# Patient Record
Sex: Female | Born: 1950 | Race: Black or African American | Hispanic: No | Smoking: Current every day smoker
Health system: Southern US, Community
[De-identification: ages and names within clinical notes are randomized; demographics above are authoritative.]

## PROBLEM LIST (undated history)

## (undated) ENCOUNTER — Ambulatory Visit: Discharge status: Absent without leave | Payer: Medicare Other

## (undated) DIAGNOSIS — M1990 Unspecified osteoarthritis, unspecified site: Secondary | ICD-10-CM

## (undated) DIAGNOSIS — Z720 Tobacco use: Secondary | ICD-10-CM

## (undated) DIAGNOSIS — K649 Unspecified hemorrhoids: Secondary | ICD-10-CM

## (undated) DIAGNOSIS — F1910 Other psychoactive substance abuse, uncomplicated: Secondary | ICD-10-CM

## (undated) DIAGNOSIS — F419 Anxiety disorder, unspecified: Secondary | ICD-10-CM

## (undated) HISTORY — PX: TUBAL LIGATION: SHX77

---

## 1997-10-20 ENCOUNTER — Emergency Department (HOSPITAL_COMMUNITY): Admission: EM | Admit: 1997-10-20 | Discharge: 1997-10-20 | Payer: Self-pay | Admitting: Emergency Medicine

## 1997-10-20 ENCOUNTER — Encounter: Payer: Self-pay | Admitting: Emergency Medicine

## 1997-12-29 ENCOUNTER — Emergency Department (HOSPITAL_COMMUNITY): Admission: EM | Admit: 1997-12-29 | Discharge: 1997-12-29 | Payer: Self-pay | Admitting: Emergency Medicine

## 1997-12-29 ENCOUNTER — Encounter: Payer: Self-pay | Admitting: Emergency Medicine

## 1998-08-13 ENCOUNTER — Emergency Department (HOSPITAL_COMMUNITY): Admission: EM | Admit: 1998-08-13 | Discharge: 1998-08-13 | Payer: Self-pay | Admitting: Emergency Medicine

## 1998-08-18 ENCOUNTER — Encounter: Payer: Self-pay | Admitting: Emergency Medicine

## 1998-08-18 ENCOUNTER — Encounter: Payer: Self-pay | Admitting: *Deleted

## 1998-08-18 ENCOUNTER — Emergency Department (HOSPITAL_COMMUNITY): Admission: EM | Admit: 1998-08-18 | Discharge: 1998-08-18 | Payer: Self-pay | Admitting: Emergency Medicine

## 1998-08-18 ENCOUNTER — Ambulatory Visit (HOSPITAL_BASED_OUTPATIENT_CLINIC_OR_DEPARTMENT_OTHER): Admission: RE | Admit: 1998-08-18 | Discharge: 1998-08-18 | Payer: Self-pay | Admitting: *Deleted

## 1999-05-24 ENCOUNTER — Emergency Department (HOSPITAL_COMMUNITY): Admission: EM | Admit: 1999-05-24 | Discharge: 1999-05-24 | Payer: Self-pay | Admitting: Emergency Medicine

## 2004-01-23 ENCOUNTER — Emergency Department (HOSPITAL_COMMUNITY): Admission: EM | Admit: 2004-01-23 | Discharge: 2004-01-23 | Payer: Self-pay | Admitting: Emergency Medicine

## 2007-01-13 ENCOUNTER — Emergency Department (HOSPITAL_COMMUNITY): Admission: EM | Admit: 2007-01-13 | Discharge: 2007-01-13 | Payer: Self-pay | Admitting: Emergency Medicine

## 2007-11-15 ENCOUNTER — Emergency Department (HOSPITAL_COMMUNITY): Admission: EM | Admit: 2007-11-15 | Discharge: 2007-11-15 | Payer: Self-pay | Admitting: Emergency Medicine

## 2009-01-07 DIAGNOSIS — F1910 Other psychoactive substance abuse, uncomplicated: Secondary | ICD-10-CM

## 2009-01-07 DIAGNOSIS — F419 Anxiety disorder, unspecified: Secondary | ICD-10-CM

## 2009-01-07 HISTORY — DX: Anxiety disorder, unspecified: F41.9

## 2009-01-07 HISTORY — DX: Other psychoactive substance abuse, uncomplicated: F19.10

## 2009-11-05 ENCOUNTER — Emergency Department (HOSPITAL_COMMUNITY): Admission: EM | Admit: 2009-11-05 | Discharge: 2009-11-05 | Payer: Self-pay | Admitting: Emergency Medicine

## 2009-11-10 ENCOUNTER — Emergency Department (HOSPITAL_COMMUNITY): Admission: EM | Admit: 2009-11-10 | Discharge: 2009-11-11 | Payer: Self-pay | Admitting: Emergency Medicine

## 2009-11-28 ENCOUNTER — Emergency Department (HOSPITAL_COMMUNITY): Admission: EM | Admit: 2009-11-28 | Discharge: 2009-11-28 | Payer: Self-pay | Admitting: Emergency Medicine

## 2009-12-07 HISTORY — PX: INCISION AND DRAINAGE: SHX5863

## 2009-12-08 ENCOUNTER — Encounter: Payer: Self-pay | Admitting: Emergency Medicine

## 2009-12-10 ENCOUNTER — Ambulatory Visit: Payer: Self-pay | Admitting: Psychiatry

## 2009-12-10 ENCOUNTER — Inpatient Hospital Stay (HOSPITAL_COMMUNITY)
Admission: EM | Admit: 2009-12-10 | Discharge: 2009-12-14 | Payer: Self-pay | Source: Home / Self Care | Attending: Psychiatry | Admitting: Psychiatry

## 2009-12-28 ENCOUNTER — Emergency Department (HOSPITAL_COMMUNITY)
Admission: EM | Admit: 2009-12-28 | Discharge: 2009-12-28 | Payer: Self-pay | Source: Home / Self Care | Admitting: Emergency Medicine

## 2010-01-23 ENCOUNTER — Emergency Department (HOSPITAL_COMMUNITY)
Admission: EM | Admit: 2010-01-23 | Discharge: 2010-01-23 | Payer: Self-pay | Source: Home / Self Care | Admitting: Emergency Medicine

## 2010-02-14 ENCOUNTER — Emergency Department (HOSPITAL_COMMUNITY)
Admission: EM | Admit: 2010-02-14 | Discharge: 2010-02-14 | Disposition: A | Discharge status: Home / Self Care | Payer: Medicaid Other | Attending: Emergency Medicine | Admitting: Emergency Medicine

## 2010-02-14 ENCOUNTER — Emergency Department (HOSPITAL_COMMUNITY): Payer: Medicaid Other

## 2010-02-14 DIAGNOSIS — R0602 Shortness of breath: Secondary | ICD-10-CM | POA: Insufficient documentation

## 2010-02-14 DIAGNOSIS — J4489 Other specified chronic obstructive pulmonary disease: Secondary | ICD-10-CM | POA: Insufficient documentation

## 2010-02-14 DIAGNOSIS — J449 Chronic obstructive pulmonary disease, unspecified: Secondary | ICD-10-CM | POA: Insufficient documentation

## 2010-02-14 DIAGNOSIS — F17200 Nicotine dependence, unspecified, uncomplicated: Secondary | ICD-10-CM | POA: Insufficient documentation

## 2010-03-20 LAB — DIFFERENTIAL
Basophils Absolute: 0 10*3/uL (ref 0.0–0.1)
Basophils Relative: 0 % (ref 0–1)
Eosinophils Absolute: 0.2 10*3/uL (ref 0.0–0.7)
Eosinophils Relative: 2 % (ref 0–5)
Lymphocytes Relative: 48 % — ABNORMAL HIGH (ref 12–46)
Lymphs Abs: 2.2 10*3/uL (ref 0.7–4.0)
Monocytes Absolute: 0.8 10*3/uL (ref 0.1–1.0)
Monocytes Absolute: 0.5 10*3/uL (ref 0.1–1.0)
Monocytes Relative: 11 % (ref 3–12)
Neutro Abs: 1.8 10*3/uL (ref 1.7–7.7)

## 2010-03-20 LAB — CBC
HCT: 45.6 % (ref 36.0–46.0)
MCH: 29 pg (ref 26.0–34.0)
MCH: 27.8 pg (ref 26.0–34.0)
MCHC: 34.9 g/dL (ref 30.0–36.0)
MCV: 83.2 fL (ref 78.0–100.0)
MCV: 84.2 fL (ref 78.0–100.0)
Platelets: 305 10*3/uL (ref 150–400)
Platelets: 287 10*3/uL (ref 150–400)
RDW: 13.4 % (ref 11.5–15.5)
RDW: 13 % (ref 11.5–15.5)
WBC: 4.6 10*3/uL (ref 4.0–10.5)

## 2010-03-20 LAB — RAPID URINE DRUG SCREEN, HOSP PERFORMED
Amphetamines: NOT DETECTED
Benzodiazepines: NOT DETECTED
Cocaine: POSITIVE — AB
Cocaine: POSITIVE — AB
Opiates: NOT DETECTED
Tetrahydrocannabinol: NOT DETECTED
Tetrahydrocannabinol: NOT DETECTED

## 2010-03-20 LAB — TRICYCLICS SCREEN, URINE
TCA Scrn: NOT DETECTED
TCA Scrn: NOT DETECTED

## 2010-03-20 LAB — COMPREHENSIVE METABOLIC PANEL
Albumin: 3.6 g/dL (ref 3.5–5.2)
BUN: 10 mg/dL (ref 6–23)
Creatinine, Ser: 0.97 mg/dL (ref 0.4–1.2)
Total Protein: 6.2 g/dL (ref 6.0–8.3)

## 2010-03-20 LAB — URINE CULTURE: Culture: NO GROWTH

## 2010-03-20 LAB — URINALYSIS, ROUTINE W REFLEX MICROSCOPIC
Bilirubin Urine: NEGATIVE
Glucose, UA: NEGATIVE mg/dL
Hgb urine dipstick: NEGATIVE
Ketones, ur: NEGATIVE mg/dL
Nitrite: NEGATIVE
Protein, ur: NEGATIVE mg/dL
Protein, ur: NEGATIVE mg/dL
Specific Gravity, Urine: 1.006 (ref 1.005–1.030)
Urobilinogen, UA: 0.2 mg/dL (ref 0.0–1.0)
pH: 6 (ref 5.0–8.0)

## 2010-03-20 LAB — POCT I-STAT, CHEM 8
Calcium, Ion: 1.02 mmol/L — ABNORMAL LOW (ref 1.12–1.32)
Chloride: 105 mEq/L (ref 96–112)
Glucose, Bld: 104 mg/dL — ABNORMAL HIGH (ref 70–99)
HCT: 52 % — ABNORMAL HIGH (ref 36.0–46.0)
Hemoglobin: 17.7 g/dL — ABNORMAL HIGH (ref 12.0–15.0)
TCO2: 24 mmol/L (ref 0–100)

## 2010-03-20 LAB — SALICYLATE LEVEL: Salicylate Lvl: <4 mg/dL (ref 2.8–20.0)

## 2010-03-20 LAB — ACETAMINOPHEN LEVEL: Acetaminophen (Tylenol), Serum: <10 ug/mL — ABNORMAL LOW (ref 10–30)

## 2010-04-22 ENCOUNTER — Emergency Department (HOSPITAL_COMMUNITY)
Admission: EM | Admit: 2010-04-22 | Discharge: 2010-04-23 | Disposition: A | Discharge status: Home / Self Care | Payer: Medicaid Other | Attending: Emergency Medicine | Admitting: Emergency Medicine

## 2010-04-22 DIAGNOSIS — J45909 Unspecified asthma, uncomplicated: Secondary | ICD-10-CM | POA: Insufficient documentation

## 2010-04-22 DIAGNOSIS — R059 Cough, unspecified: Secondary | ICD-10-CM | POA: Insufficient documentation

## 2010-04-22 DIAGNOSIS — R05 Cough: Secondary | ICD-10-CM | POA: Insufficient documentation

## 2010-06-14 ENCOUNTER — Other Ambulatory Visit: Payer: Self-pay | Admitting: Pulmonary Disease

## 2010-06-14 ENCOUNTER — Ambulatory Visit
Admission: RE | Admit: 2010-06-14 | Discharge: 2010-06-14 | Disposition: A | Discharge status: Home / Self Care | Payer: Medicaid Other | Source: Ambulatory Visit | Attending: Pulmonary Disease | Admitting: Pulmonary Disease

## 2010-06-14 DIAGNOSIS — J449 Chronic obstructive pulmonary disease, unspecified: Secondary | ICD-10-CM

## 2010-06-14 DIAGNOSIS — J9801 Acute bronchospasm: Secondary | ICD-10-CM

## 2010-06-14 DIAGNOSIS — R05 Cough: Secondary | ICD-10-CM

## 2010-06-28 ENCOUNTER — Institutional Professional Consult (permissible substitution): Payer: Medicaid Other | Admitting: Internal Medicine

## 2010-08-02 ENCOUNTER — Ambulatory Visit (INDEPENDENT_AMBULATORY_CARE_PROVIDER_SITE_OTHER): Payer: Medicaid Other | Admitting: Internal Medicine

## 2010-08-02 ENCOUNTER — Encounter: Payer: Self-pay | Admitting: Internal Medicine

## 2010-08-02 VITALS — BP 122/80 | HR 70 | Temp 98.2°F | Ht 66.0 in | Wt 150.0 lb

## 2010-08-02 DIAGNOSIS — R0600 Dyspnea, unspecified: Secondary | ICD-10-CM

## 2010-08-02 DIAGNOSIS — R0609 Other forms of dyspnea: Secondary | ICD-10-CM

## 2010-08-02 MED ORDER — FAMOTIDINE 20 MG PO TABS: ORAL_TABLET | ORAL | Status: DC

## 2010-08-02 MED ORDER — TRAMADOL HCL 50 MG PO TABS
ORAL_TABLET | ORAL | Status: AC
Start: 2010-08-02 — End: 2010-08-12

## 2010-08-02 MED ORDER — OMEPRAZOLE 40 MG PO CPDR: 40.0000 mg | DELAYED_RELEASE_CAPSULE | Freq: Every day | ORAL | Status: DC

## 2010-08-02 NOTE — Patient Instructions (Addendum)
GERD (REFLUX)  is an extremely common cause of respiratory symptoms, many times with no significant heartburn at all.    It can be treated with medication, but also with lifestyle changes including avoidance of late meals, excessive alcohol, smoking cessation, and avoid fatty foods, chocolate, peppermint, colas, red wine, and acidic juices such as orange juice.  NO MINT OR MENTHOL PRODUCTS SO NO COUGH DROPS  USE SUGARLESS CANDY INSTEAD (jolley ranchers or Stover's)  NO OIL BASED VITAMINS(no fish oil)  Stop dulera and only use the albuterol (ventolin or nebulizer)  Up to every 4 hours if can't catch your breath.  Omeprazole (prilosec) 40 mg Take 30-60 min before first meal of the day and Pepcid 20 mg one at bedtime until return  If your throat is not better and you continue to feel the urge to clear it I want you take ultram (tramadol) 50 mg one every 4 hour as needed (this will also help with your shoulder pain)  Please schedule a follow up office visit in 4 weeks, sooner if needed with PFTs

## 2010-08-02 NOTE — Assessment & Plan Note (Signed)
Symptoms are markedly disproportionate to objective findings and not clear this is a lung problem but pt does appear to have difficult airway management issues.   DDX of  difficult airways managment all start with A and  include Adherence, Ace Inhibitors, Acid Reflux, Active Sinus Disease, Alpha 1 Antitripsin deficiency, Anxiety masquerading as Airways dz,  ABPA,  allergy(esp in young), Aspiration (esp in elderly), Adverse effects of DPI,  Active smokers, plus two Bs  = Bronchiectasis and Beta blocker use..and one C= CHF  Adherence is always the initial "prime suspect" and is a multilayered concern that requires a "trust but verify" approach in every patient - starting with knowing how to use medications, especially inhalers, correctly, keeping up with refills and understanding the fundamental difference between maintenance and prns vs those medications only taken for a very short course and then stopped and not refilled.   Acid reflux/GERD/LPR/VCD very likely here vs Anxiety  See instructions for specific recommendations which were reviewed directly with the patient who was given a copy with highlighter outlining the key components.

## 2010-08-02 NOTE — Progress Notes (Signed)
Subjective:     Patient ID: Dawn Lane, female   DOB: [birth year], 60 y.o.   MRN: [id number]  HPI  1 yobf active smoker with breathing problems since the 90's seen by Dr Jethro Bolus with reported neg skin tests and no better on dulera so referred to pulmonary clinic in July 2012  08/02/2010 Initial pulmonary office eval cc doe x talking,  100 ft, some better from ventolin. Constant urge to clear her throat x months, L shoulder pain on abd x 6 months,  Ok cxr in June 2012.  Sleeping ok without nocturnal  or early am exac of resp c/o's or need for noct saba. Pt denies any significant sore throat, dysphagia, itching, sneezing,  nasal congestion or excess/ purulent secretions,  fever, chills, sweats, unintended wt loss, pleuritic or exertional cp, hempoptysis, orthopnea pnd .    Also denies any obvious fluctuation of symptoms with weather or environmental changes or other aggravating or alleviating factors.    Review of Systems  Constitutional: Negative for fever, chills and unexpected weight change.  HENT: Negative for ear pain, nosebleeds, congestion, sore throat, rhinorrhea, sneezing, trouble swallowing, dental problem, voice change, postnasal drip and sinus pressure.   Eyes: Negative for visual disturbance.  Respiratory: Positive for shortness of breath. Negative for cough and choking.   Cardiovascular: Positive for leg swelling. Negative for chest pain.  Gastrointestinal: Negative for vomiting, abdominal pain and diarrhea.  Genitourinary: Negative for difficulty urinating.  Musculoskeletal: Positive for arthralgias.  Skin: Negative for rash.  Neurological: Negative for tremors, syncope and headaches.  Hematological: Does not bruise/bleed easily.  Psychiatric/Behavioral: The patient is nervous/anxious.        Objective:   Physical Exam amb bf with constant throat clearing and pseudowheeze  Wt 150 08/02/2010  HEENT: nl dentition, turbinates, and orophanx. Nl external ear canals  without cough reflex   NECK :  without JVD/Nodes/TM/ nl carotid upstrokes bilaterally   LUNGS: no acc muscle use, clear to A and P bilaterally without cough on insp or exp maneuvers   CV:  RRR  no s3 or murmur or increase in P2, no edema   ABD:  soft and nontender with nl excursion in the supine position. No bruits or organomegaly, bowel sounds nl  MS:  warm without deformities, calf tenderness, cyanosis or clubbing  SKIN: warm and dry without lesions    NEURO:  alert, approp, no deficits   cxr 06/14/10 Stable chest x-ray with mild cardiomegaly. No active lung disease     Assessment:       Plan:

## 2010-08-06 ENCOUNTER — Emergency Department (HOSPITAL_COMMUNITY)
Admission: EM | Admit: 2010-08-06 | Discharge: 2010-08-06 | Discharge status: Against Medical Advice | Payer: Medicaid Other | Attending: Emergency Medicine | Admitting: Emergency Medicine

## 2010-08-06 ENCOUNTER — Inpatient Hospital Stay (INDEPENDENT_AMBULATORY_CARE_PROVIDER_SITE_OTHER): Admit: 2010-08-06 | Discharge: 2010-08-06 | Disposition: A | Discharge status: Home / Self Care | Payer: Self-pay

## 2010-08-06 DIAGNOSIS — M79609 Pain in unspecified limb: Secondary | ICD-10-CM | POA: Insufficient documentation

## 2010-08-06 DIAGNOSIS — S99919A Unspecified injury of unspecified ankle, initial encounter: Secondary | ICD-10-CM

## 2010-08-06 DIAGNOSIS — S8010XA Contusion of unspecified lower leg, initial encounter: Secondary | ICD-10-CM

## 2010-08-06 DIAGNOSIS — M549 Dorsalgia, unspecified: Secondary | ICD-10-CM | POA: Insufficient documentation

## 2010-08-21 ENCOUNTER — Emergency Department (HOSPITAL_COMMUNITY)
Admission: EM | Admit: 2010-08-21 | Discharge: 2010-08-21 | Disposition: A | Discharge status: Home / Self Care | Payer: Medicaid Other | Attending: Emergency Medicine | Admitting: Emergency Medicine

## 2010-08-21 ENCOUNTER — Emergency Department (HOSPITAL_COMMUNITY): Payer: Medicaid Other

## 2010-08-21 DIAGNOSIS — M545 Low back pain, unspecified: Secondary | ICD-10-CM | POA: Insufficient documentation

## 2010-08-21 DIAGNOSIS — F411 Generalized anxiety disorder: Secondary | ICD-10-CM | POA: Insufficient documentation

## 2010-08-21 DIAGNOSIS — Z79899 Other long term (current) drug therapy: Secondary | ICD-10-CM | POA: Insufficient documentation

## 2010-08-21 DIAGNOSIS — R109 Unspecified abdominal pain: Secondary | ICD-10-CM | POA: Insufficient documentation

## 2010-08-21 DIAGNOSIS — J45909 Unspecified asthma, uncomplicated: Secondary | ICD-10-CM | POA: Insufficient documentation

## 2010-08-21 LAB — BASIC METABOLIC PANEL
BUN: 9 mg/dL (ref 6–23)
Calcium: 9.7 mg/dL (ref 8.4–10.5)
GFR calc Af Amer: >60 mL/min (ref >60)
GFR calc non Af Amer: >60 mL/min (ref >60)
Glucose, Bld: 112 mg/dL — ABNORMAL HIGH (ref 70–99)
Potassium: 4 mEq/L (ref 3.5–5.1)
Sodium: 143 mEq/L (ref 135–145)

## 2010-08-21 LAB — DIFFERENTIAL
Basophils Relative: 1 % (ref 0–1)
Eosinophils Absolute: 0.1 10*3/uL (ref 0.0–0.7)
Eosinophils Relative: 2 % (ref 0–5)
Monocytes Absolute: 0.3 10*3/uL (ref 0.1–1.0)
Monocytes Relative: 9 % (ref 3–12)

## 2010-08-21 LAB — URINALYSIS, ROUTINE W REFLEX MICROSCOPIC
Bilirubin Urine: NEGATIVE
Hgb urine dipstick: NEGATIVE
Ketones, ur: NEGATIVE mg/dL
Protein, ur: NEGATIVE mg/dL
Urobilinogen, UA: 0.2 mg/dL (ref 0.0–1.0)

## 2010-08-21 LAB — CBC
Hemoglobin: 15.1 g/dL — ABNORMAL HIGH (ref 12.0–15.0)
MCH: 28.4 pg (ref 26.0–34.0)
MCHC: 33.9 g/dL (ref 30.0–36.0)
RDW: 13.1 % (ref 11.5–15.5)

## 2010-08-22 LAB — URINE CULTURE: Culture  Setup Time: 201208141728

## 2010-09-03 ENCOUNTER — Ambulatory Visit: Payer: Medicaid Other | Admitting: Internal Medicine

## 2011-11-27 ENCOUNTER — Emergency Department (HOSPITAL_COMMUNITY)
Admission: EM | Admit: 2011-11-27 | Discharge: 2011-11-27 | Disposition: A | Discharge status: Home / Self Care | Payer: Medicaid Other | Attending: Emergency Medicine | Admitting: Emergency Medicine

## 2011-11-27 ENCOUNTER — Emergency Department (HOSPITAL_COMMUNITY): Payer: Medicaid Other

## 2011-11-27 DIAGNOSIS — S79919A Unspecified injury of unspecified hip, initial encounter: Secondary | ICD-10-CM | POA: Insufficient documentation

## 2011-11-27 DIAGNOSIS — S46909A Unspecified injury of unspecified muscle, fascia and tendon at shoulder and upper arm level, unspecified arm, initial encounter: Secondary | ICD-10-CM | POA: Insufficient documentation

## 2011-11-27 DIAGNOSIS — H538 Other visual disturbances: Secondary | ICD-10-CM | POA: Insufficient documentation

## 2011-11-27 DIAGNOSIS — R29898 Other symptoms and signs involving the musculoskeletal system: Secondary | ICD-10-CM | POA: Insufficient documentation

## 2011-11-27 DIAGNOSIS — S4980XA Other specified injuries of shoulder and upper arm, unspecified arm, initial encounter: Secondary | ICD-10-CM | POA: Insufficient documentation

## 2011-11-27 DIAGNOSIS — Z79899 Other long term (current) drug therapy: Secondary | ICD-10-CM | POA: Insufficient documentation

## 2011-11-27 DIAGNOSIS — Y92410 Unspecified street and highway as the place of occurrence of the external cause: Secondary | ICD-10-CM | POA: Insufficient documentation

## 2011-11-27 DIAGNOSIS — F17200 Nicotine dependence, unspecified, uncomplicated: Secondary | ICD-10-CM | POA: Insufficient documentation

## 2011-11-27 DIAGNOSIS — Y9389 Activity, other specified: Secondary | ICD-10-CM | POA: Insufficient documentation

## 2011-11-27 DIAGNOSIS — J45909 Unspecified asthma, uncomplicated: Secondary | ICD-10-CM | POA: Insufficient documentation

## 2011-11-27 MED ORDER — MORPHINE SULFATE 4 MG/ML IJ SOLN
4.0000 mg | Freq: Once | INTRAMUSCULAR | Status: AC
  Administered 2011-11-27: 4 mg via INTRAVENOUS
  Filled 2011-11-27: qty 1

## 2011-11-27 MED ORDER — ONDANSETRON 4 MG PO TBDP
4.0000 mg | ORAL_TABLET | Freq: Once | ORAL | Status: AC
  Administered 2011-11-27: 4 mg via ORAL
  Filled 2011-11-27: qty 1

## 2011-11-27 MED ORDER — OXYCODONE-ACETAMINOPHEN 5-325 MG PO TABS: 2.0000 | ORAL_TABLET | ORAL | Status: DC | PRN
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
## 2011-12-11 ENCOUNTER — Encounter (HOSPITAL_COMMUNITY): Payer: Self-pay | Admitting: *Deleted

## 2011-12-11 ENCOUNTER — Emergency Department (HOSPITAL_COMMUNITY)
Admission: EM | Admit: 2011-12-11 | Discharge: 2011-12-11 | Disposition: A | Discharge status: Home / Self Care | Payer: Medicaid Other | Attending: Emergency Medicine | Admitting: Emergency Medicine

## 2011-12-11 DIAGNOSIS — J45909 Unspecified asthma, uncomplicated: Secondary | ICD-10-CM | POA: Insufficient documentation

## 2011-12-11 DIAGNOSIS — Z79899 Other long term (current) drug therapy: Secondary | ICD-10-CM | POA: Insufficient documentation

## 2011-12-11 DIAGNOSIS — F17200 Nicotine dependence, unspecified, uncomplicated: Secondary | ICD-10-CM | POA: Insufficient documentation

## 2011-12-11 DIAGNOSIS — M79609 Pain in unspecified limb: Secondary | ICD-10-CM | POA: Insufficient documentation

## 2011-12-11 DIAGNOSIS — M79606 Pain in leg, unspecified: Secondary | ICD-10-CM

## 2011-12-11 DIAGNOSIS — J438 Other emphysema: Secondary | ICD-10-CM | POA: Insufficient documentation

## 2011-12-11 DIAGNOSIS — Z87828 Personal history of other (healed) physical injury and trauma: Secondary | ICD-10-CM | POA: Insufficient documentation

## 2011-12-11 MED ORDER — OXYCODONE-ACETAMINOPHEN 5-325 MG PO TABS: 1.0000 | ORAL_TABLET | Freq: Three times a day (TID) | ORAL | Status: DC | PRN

## 2011-12-11 MED ORDER — OXYCODONE-ACETAMINOPHEN 5-325 MG PO TABS
2.0000 | ORAL_TABLET | Freq: Once | ORAL | Status: AC
  Administered 2011-12-11: 2 via ORAL
  Filled 2011-12-11: qty 2
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
## 2011-12-11 NOTE — ED Provider Notes (Signed)
Medical screening examination/treatment/procedure(s) were performed by non-physician practitioner and as supervising physician I was immediately available for consultation/collaboration.  Toy Baker, MD 12/11/11 [phone]
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
## 2011-12-25 ENCOUNTER — Emergency Department (HOSPITAL_COMMUNITY)
Admission: EM | Admit: 2011-12-25 | Discharge: 2011-12-25 | Disposition: A | Discharge status: Home / Self Care | Payer: Medicaid Other | Attending: Emergency Medicine | Admitting: Emergency Medicine

## 2011-12-25 ENCOUNTER — Encounter (HOSPITAL_COMMUNITY): Payer: Self-pay | Admitting: *Deleted

## 2011-12-25 DIAGNOSIS — Z8781 Personal history of (healed) traumatic fracture: Secondary | ICD-10-CM | POA: Insufficient documentation

## 2011-12-25 DIAGNOSIS — R51 Headache: Secondary | ICD-10-CM | POA: Insufficient documentation

## 2011-12-25 DIAGNOSIS — J45909 Unspecified asthma, uncomplicated: Secondary | ICD-10-CM | POA: Insufficient documentation

## 2011-12-25 DIAGNOSIS — Z79899 Other long term (current) drug therapy: Secondary | ICD-10-CM | POA: Insufficient documentation

## 2011-12-25 DIAGNOSIS — F17200 Nicotine dependence, unspecified, uncomplicated: Secondary | ICD-10-CM | POA: Insufficient documentation

## 2011-12-25 DIAGNOSIS — J438 Other emphysema: Secondary | ICD-10-CM | POA: Insufficient documentation

## 2011-12-25 DIAGNOSIS — H53149 Visual discomfort, unspecified: Secondary | ICD-10-CM | POA: Insufficient documentation

## 2011-12-25 MED ORDER — KETOROLAC TROMETHAMINE 60 MG/2ML IM SOLN
60.0000 mg | Freq: Once | INTRAMUSCULAR | Status: AC
  Administered 2011-12-25: 60 mg via INTRAMUSCULAR
  Filled 2011-12-25: qty 2

## 2011-12-25 NOTE — ED Notes (Signed)
Pt refused CT scan
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
## 2011-12-25 NOTE — ED Notes (Signed)
Pt reports she has another appointment she needs to go to and doesn't think she can stay any longer. Pt requests a list of neurologist in the area.

## 2012-08-26 ENCOUNTER — Ambulatory Visit: Payer: No Typology Code available for payment source | Attending: Orthopaedic Surgery | Admitting: Physical Therapy

## 2012-09-14 ENCOUNTER — Emergency Department (HOSPITAL_COMMUNITY): Payer: Medicaid Other

## 2012-09-14 ENCOUNTER — Encounter (HOSPITAL_COMMUNITY): Payer: Self-pay | Admitting: Emergency Medicine

## 2012-09-14 ENCOUNTER — Emergency Department (HOSPITAL_COMMUNITY)
Admission: EM | Admit: 2012-09-14 | Discharge: 2012-09-14 | Disposition: A | Discharge status: Home / Self Care | Payer: Medicaid Other | Attending: Emergency Medicine | Admitting: Emergency Medicine

## 2012-09-14 DIAGNOSIS — J438 Other emphysema: Secondary | ICD-10-CM | POA: Insufficient documentation

## 2012-09-14 DIAGNOSIS — Z885 Allergy status to narcotic agent status: Secondary | ICD-10-CM | POA: Insufficient documentation

## 2012-09-14 DIAGNOSIS — J45901 Unspecified asthma with (acute) exacerbation: Secondary | ICD-10-CM | POA: Insufficient documentation

## 2012-09-14 DIAGNOSIS — R0602 Shortness of breath: Secondary | ICD-10-CM | POA: Insufficient documentation

## 2012-09-14 DIAGNOSIS — F17200 Nicotine dependence, unspecified, uncomplicated: Secondary | ICD-10-CM | POA: Insufficient documentation

## 2012-09-14 DIAGNOSIS — Z888 Allergy status to other drugs, medicaments and biological substances status: Secondary | ICD-10-CM | POA: Insufficient documentation

## 2012-09-14 DIAGNOSIS — Z79899 Other long term (current) drug therapy: Secondary | ICD-10-CM | POA: Insufficient documentation

## 2012-09-14 LAB — BASIC METABOLIC PANEL
BUN: 10 mg/dL (ref 6–23)
CO2: 27 mEq/L (ref 19–32)
Calcium: 9.8 mg/dL (ref 8.4–10.5)
Chloride: 102 mEq/L (ref 96–112)
Creatinine, Ser: 0.9 mg/dL (ref 0.50–1.10)
Glucose, Bld: 106 mg/dL — ABNORMAL HIGH (ref 70–99)

## 2012-09-14 LAB — PRO B NATRIURETIC PEPTIDE: Pro B Natriuretic peptide (BNP): 80.2 pg/mL (ref 0–125)

## 2012-09-14 LAB — CBC WITH DIFFERENTIAL/PLATELET
Eosinophils Relative: 2 % (ref 0–5)
HCT: 45.4 % (ref 36.0–46.0)
Hemoglobin: 15.1 g/dL — ABNORMAL HIGH (ref 12.0–15.0)
Lymphocytes Relative: 42 % (ref 12–46)
Lymphs Abs: 1.8 10*3/uL (ref 0.7–4.0)
MCV: 84.5 fL (ref 78.0–100.0)
Monocytes Absolute: 0.3 10*3/uL (ref 0.1–1.0)
Monocytes Relative: 7 % (ref 3–12)
Neutro Abs: 2.1 10*3/uL (ref 1.7–7.7)
WBC: 4.3 10*3/uL (ref 4.0–10.5)

## 2012-09-14 MED ORDER — ALBUTEROL SULFATE HFA 108 (90 BASE) MCG/ACT IN AERS: 2.0000 | INHALATION_SPRAY | Freq: Four times a day (QID) | RESPIRATORY_TRACT | Status: AC | PRN

## 2012-09-14 MED ORDER — IPRATROPIUM BROMIDE 0.02 % IN SOLN
0.5000 mg | Freq: Once | RESPIRATORY_TRACT | Status: AC
  Administered 2012-09-14: 0.5 mg via RESPIRATORY_TRACT

## 2012-09-14 MED ORDER — ALBUTEROL SULFATE (5 MG/ML) 0.5% IN NEBU
INHALATION_SOLUTION | RESPIRATORY_TRACT | Status: AC
  Administered 2012-09-14: 5 mg via RESPIRATORY_TRACT
  Filled 2012-09-14: qty 1

## 2012-09-14 MED ORDER — METHYLPREDNISOLONE SODIUM SUCC 125 MG IJ SOLR
125.0000 mg | Freq: Once | INTRAMUSCULAR | Status: AC
  Administered 2012-09-14: 125 mg via INTRAVENOUS
  Filled 2012-09-14: qty 2

## 2012-09-14 MED ORDER — ALBUTEROL SULFATE (2.5 MG/3ML) 0.083% IN NEBU: 2.5000 mg | INHALATION_SOLUTION | Freq: Four times a day (QID) | RESPIRATORY_TRACT | Status: DC | PRN

## 2012-09-14 MED ORDER — ALBUTEROL (5 MG/ML) CONTINUOUS INHALATION SOLN
10.0000 mg/h | INHALATION_SOLUTION | RESPIRATORY_TRACT | Status: DC
  Administered 2012-09-14: 10 mg/h via RESPIRATORY_TRACT
  Filled 2012-09-14: qty 20

## 2012-09-14 MED ORDER — ALBUTEROL SULFATE (5 MG/ML) 0.5% IN NEBU
5.0000 mg | INHALATION_SOLUTION | Freq: Once | RESPIRATORY_TRACT | Status: AC
  Administered 2012-09-14: 5 mg via RESPIRATORY_TRACT

## 2012-09-14 MED ORDER — PREDNISONE 20 MG PO TABS: 60.0000 mg | ORAL_TABLET | Freq: Every day | ORAL | Status: DC

## 2012-09-14 MED ORDER — IPRATROPIUM BROMIDE 0.02 % IN SOLN
RESPIRATORY_TRACT | Status: AC
  Administered 2012-09-14: 0.5 mg via RESPIRATORY_TRACT
  Filled 2012-09-14: qty 2.5

## 2012-09-14 MED ORDER — IPRATROPIUM BROMIDE 0.02 % IN SOLN
1.0000 mg | Freq: Once | RESPIRATORY_TRACT | Status: AC
  Administered 2012-09-14: 1 mg via RESPIRATORY_TRACT
  Filled 2012-09-14: qty 5

## 2012-09-14 NOTE — ED Provider Notes (Signed)
TIME SEEN: 11:45 AM  CHIEF COMPLAINT: Shortness of breath  HPI: Patient is a 62 y.o. female with a history of asthma and reported history of congestive heart failure who presents emergency department with 2 days of shortness of breath and wheezing. Patient has a history of tobacco use she states that she has quit smoking. She denies any fevers. She has had a cough with white sputum production. No chest pain. She reports she has tried albuterol at home without relief. No history of PE or DVT. No lower extremity swelling or pain. Patient is not chronically on oxygen.   ROS: See HPI Constitutional: no fever  Eyes: no drainage  ENT: no runny nose   Cardiovascular:  no chest pain  Resp:  SOB  GI: no vomiting GU: no dysuria Integumentary: no rash  Allergy: no hives  Musculoskeletal: no leg swelling  Neurological: no slurred speech ROS otherwise negative  PAST MEDICAL HISTORY/PAST SURGICAL HISTORY:  Past Medical History  Diagnosis Date  . Emphysema   . Asthma     MEDICATIONS:  Prior to Admission medications   Medication Sig Start Date End Date Taking? Authorizing Provider  albuterol (PROVENTIL) (2.5 MG/3ML) 0.083% nebulizer solution Take 2.5 mg by nebulization every 6 (six) hours as needed. For shortness of breath    Historical Provider, MD  albuterol (VENTOLIN HFA) 108 (90 BASE) MCG/ACT inhaler Inhale 2 puffs into the lungs every 6 (six) hours as needed. For shortness of breath    Historical Provider, MD  ALPRAZolam Prudy Feeler) 0.5 MG tablet Take 0.5 mg by mouth 3 (three) times daily as needed. For anxiety    Historical Provider, MD  meloxicam (MOBIC) 7.5 MG tablet Take 7.5 mg by mouth 2 (two) times daily as needed. For pain    Historical Provider, MD  PREDNISONE, PAK, PO Take 1 tablet by mouth 4 (four) times daily.    Historical Provider, MD    ALLERGIES:  Allergies  Allergen Reactions  . Lorazepam Other (See Comments)    Cardiac arrest  . Vicodin [Hydrocodone-Acetaminophen] Swelling     SOCIAL HISTORY:  History  Substance Use Topics  . Smoking status: Current Every Day Smoker -- 0.50 packs/day for 50 years    Types: Cigarettes  . Smokeless tobacco: Never Used  . Alcohol Use: No    FAMILY HISTORY: Family History  Problem Relation Age of Onset  . Emphysema Father   . Asthma Sister   . Lung cancer Father     was a smoker  . Heart disease Father   . Uterine cancer Mother   . Prostate cancer Father     EXAM: BP 184/142  Pulse 84  Temp(Src) 97.3 F (36.3 C) (Oral)  Resp 32  SpO2 98% CONSTITUTIONAL: Alert and oriented and responds appropriately to questions.  speaks in short sentences, mild respiratory distress  HEAD: Normocephalic EYES: Conjunctivae clear, PERRL ENT: normal nose; no rhinorrhea; moist mucous membranes; pharynx without lesions noted NECK: Supple, no meningismus, no LAD  CARD: RRR; S1 and S2 appreciated; no murmurs, no clicks, no rubs, no gallops RESP:  tachypneic, diffuse expiratory wheezing, diminished at bases, no rhonchi or rales   ABD/GI: Normal bowel sounds; non-distended; soft, non-tender, no rebound, no guarding BACK:  The back appears normal and is non-tender to palpation, there is no CVA tenderness EXT: Normal ROM in all joints; non-tender to palpation; no edema; normal capillary refill; no cyanosis    SKIN: Normal color for age and race; warm NEURO: Moves all extremities equally PSYCH:  The patient's mood and manner are appropriate. Grooming and personal hygiene are appropriate.  MEDICAL DECISION MAKING: Patient with asthma exacerbation versus CHF. Per her records, there is no documented history of CHF the patient reports she has a history of heart failure. Will obtain labs including troponin. We'll obtain EKG and chest x-ray. We'll give continuous albuterol and Solu-Medrol.   ED PROGRESS: I stat troponin is 0.01.   Date: 09/14/2012 12:59 PM  Rate: 66  Rhythm: normal sinus rhythm  QRS Axis: Left axis deviation  Intervals:  normal  ST/T Wave abnormalities: normal  Conduction Disutrbances: none  Narrative Interpretation: Artifact present, left axis deviation, nonspecific ST abnormality, no ischemic changes, no old for comparison   1:49 PM  Pt's lungs are completely clear to auscultation and she is satting 100% on room air. Her respiratory distress has completely resolved. She is no longer tachypneic. Able to ambulate without any distress. Labs and chest x-ray unremarkable. We'll discharge him with refill for albuterol, prescription for prednisone and return precautions. Patient verbalizes understanding is comfortable plan.  Layla Maw Chace Klippel, DO 09/14/12 1350

## 2012-09-14 NOTE — ED Notes (Signed)
Pt c/o increased SOB from asthma; pt sts no treatments x 2 days; pt distressed at present

## 2013-05-20 ENCOUNTER — Encounter (HOSPITAL_COMMUNITY): Payer: Self-pay | Admitting: Emergency Medicine

## 2013-05-20 ENCOUNTER — Emergency Department (HOSPITAL_COMMUNITY)
Admission: EM | Admit: 2013-05-20 | Discharge: 2013-05-21 | Disposition: A | Discharge status: Home / Self Care | Payer: Medicaid Other | Attending: Emergency Medicine | Admitting: Emergency Medicine

## 2013-05-20 ENCOUNTER — Emergency Department (HOSPITAL_COMMUNITY): Payer: Medicaid Other

## 2013-05-20 DIAGNOSIS — F17200 Nicotine dependence, unspecified, uncomplicated: Secondary | ICD-10-CM | POA: Insufficient documentation

## 2013-05-20 DIAGNOSIS — Z79899 Other long term (current) drug therapy: Secondary | ICD-10-CM | POA: Insufficient documentation

## 2013-05-20 DIAGNOSIS — N281 Cyst of kidney, acquired: Secondary | ICD-10-CM

## 2013-05-20 DIAGNOSIS — R109 Unspecified abdominal pain: Secondary | ICD-10-CM

## 2013-05-20 DIAGNOSIS — Q619 Cystic kidney disease, unspecified: Secondary | ICD-10-CM | POA: Insufficient documentation

## 2013-05-20 DIAGNOSIS — J438 Other emphysema: Secondary | ICD-10-CM | POA: Insufficient documentation

## 2013-05-20 DIAGNOSIS — R1011 Right upper quadrant pain: Secondary | ICD-10-CM | POA: Insufficient documentation

## 2013-05-20 LAB — CBC WITH DIFFERENTIAL/PLATELET
BASOS ABS: 0 10*3/uL (ref 0.0–0.1)
BASOS PCT: 0 % (ref 0–1)
EOS ABS: 0 10*3/uL (ref 0.0–0.7)
Eosinophils Relative: 0 % (ref 0–5)
HEMATOCRIT: 45.7 % (ref 36.0–46.0)
HEMOGLOBIN: 15.3 g/dL — AB (ref 12.0–15.0)
Lymphocytes Relative: 8 % — ABNORMAL LOW (ref 12–46)
Lymphs Abs: 0.7 10*3/uL (ref 0.7–4.0)
MCH: 28.4 pg (ref 26.0–34.0)
MCHC: 33.5 g/dL (ref 30.0–36.0)
MCV: 84.9 fL (ref 78.0–100.0)
MONO ABS: 0.6 10*3/uL (ref 0.1–1.0)
MONOS PCT: 7 % (ref 3–12)
NEUTROS ABS: 7.6 10*3/uL (ref 1.7–7.7)
NEUTROS PCT: 85 % — AB (ref 43–77)
Platelets: 244 10*3/uL (ref 150–400)
RBC: 5.38 MIL/uL — ABNORMAL HIGH (ref 3.87–5.11)
RDW: 13.2 % (ref 11.5–15.5)
WBC: 8.9 10*3/uL (ref 4.0–10.5)

## 2013-05-20 LAB — URINALYSIS, ROUTINE W REFLEX MICROSCOPIC
Bilirubin Urine: NEGATIVE
GLUCOSE, UA: NEGATIVE mg/dL
HGB URINE DIPSTICK: NEGATIVE
Ketones, ur: NEGATIVE mg/dL
LEUKOCYTES UA: NEGATIVE
Nitrite: NEGATIVE
PH: 5.5 (ref 5.0–8.0)
Protein, ur: 30 mg/dL — AB
SPECIFIC GRAVITY, URINE: 1.019 (ref 1.005–1.030)
Urobilinogen, UA: 1 mg/dL (ref 0.0–1.0)

## 2013-05-20 LAB — COMPREHENSIVE METABOLIC PANEL
ALBUMIN: 4.2 g/dL (ref 3.5–5.2)
ALT: 17 U/L (ref 0–35)
AST: 18 U/L (ref 0–37)
Alkaline Phosphatase: 60 U/L (ref 39–117)
BUN: 16 mg/dL (ref 6–23)
CHLORIDE: 101 meq/L (ref 96–112)
CO2: 29 mEq/L (ref 19–32)
CREATININE: 0.94 mg/dL (ref 0.50–1.10)
Calcium: 9.8 mg/dL (ref 8.4–10.5)
GFR calc Af Amer: 73 mL/min — ABNORMAL LOW (ref >90)
GFR calc non Af Amer: 63 mL/min — ABNORMAL LOW (ref >90)
Glucose, Bld: 115 mg/dL — ABNORMAL HIGH (ref 70–99)
Potassium: 3.8 mEq/L (ref 3.7–5.3)
Sodium: 142 mEq/L (ref 137–147)
TOTAL PROTEIN: 7.4 g/dL (ref 6.0–8.3)
Total Bilirubin: 0.6 mg/dL (ref 0.3–1.2)

## 2013-05-20 LAB — URINE MICROSCOPIC-ADD ON

## 2013-05-20 LAB — LIPASE, BLOOD: LIPASE: 17 U/L (ref 11–59)

## 2013-05-20 MED ORDER — ONDANSETRON HCL 4 MG/2ML IJ SOLN
4.0000 mg | Freq: Once | INTRAMUSCULAR | Status: AC
  Administered 2013-05-20: 4 mg via INTRAVENOUS
  Filled 2013-05-20: qty 2

## 2013-05-20 MED ORDER — FENTANYL CITRATE 0.05 MG/ML IJ SOLN
100.0000 ug | Freq: Once | INTRAMUSCULAR | Status: AC
  Administered 2013-05-20: 100 ug via INTRAVENOUS
  Filled 2013-05-20: qty 2

## 2013-05-20 NOTE — ED Notes (Signed)
US at bedside

## 2013-05-20 NOTE — ED Notes (Addendum)
Patient states she ate seafood at a chinese buffet on [address] earlier today around 1400-1500, and began feeling poorly afterwards, c/o abd cramping and nausea, states she began vomiting about 1 hour ago. Patient states she has vomited @ 9 times since then. Patient also c/o constipation. Patient reports being under pain contract with Dr Fredonia Highland, reports prior "dirty" drug screen for marijuana, reports using marijuana on Sunday. Family at bedside reports he also ate at the [employer] and does not have any of the same symptoms, also states he did not eat the seafood.

## 2013-05-20 NOTE — ED Notes (Signed)
Patient requesting POs. Patient was advised she can not have anything to eat or drink at this time. Patient family member at bedside requesting drink. Patient support person given ice water, was advised he should not share with the patient.

## 2013-05-20 NOTE — ED Notes (Signed)
Patient continues to c/o nausea, has not had any emesis since EMS arrived at @2030 

## 2013-05-20 NOTE — ED Notes (Signed)
Bed: LK91 Expected date:  Expected time:  Means of arrival:  Comments: EMS 63yo F N/V/D

## 2013-05-20 NOTE — ED Notes (Addendum)
Per EMS, patient c/o N/V,  patient states she believes it is from seafood on a buffet she ate at 1330, family at bedside reports patient having eaten [place] and then began getting sick just PTA. Patient c/o chills.

## 2013-05-20 NOTE — ED Notes (Signed)
Patient is requesting a laxative for her constipation.

## 2013-05-20 NOTE — ED Provider Notes (Signed)
CSN: [id number]     Arrival date & time 05/20/13  2041 History   First MD Initiated Contact with Patient 05/20/13 2048     Chief Complaint  Patient presents with  . Nausea  . Emesis      HPI Per EMS, patient c/o N/V, patient states she believes it is from seafood on a buffet she ate at 1330, family at bedside reports patient having eaten [place] and then began getting sick just PTA.  Past Medical History  Diagnosis Date  . Emphysema   . Asthma    History reviewed. No pertinent past surgical history. Family History  Problem Relation Age of Onset  . Emphysema Father   . Asthma Sister   . Lung cancer Father     was a smoker  . Heart disease Father   . Uterine cancer Mother   . Prostate cancer Father    History  Substance Use Topics  . Smoking status: Current Every Day Smoker -- 0.50 packs/day for 50 years    Types: Cigarettes  . Smokeless tobacco: Never Used  . Alcohol Use: Yes     Comment: occasionally   OB History   Grav Para Term Preterm Abortions TAB SAB Ect Mult Living                 Review of Systems  All other systems reviewed and are negative   Allergies  Lorazepam and Vicodin  Home Medications   Prior to Admission medications   Medication Sig Start Date End Date Taking? Authorizing Provider  albuterol (PROVENTIL HFA;VENTOLIN HFA) 108 (90 BASE) MCG/ACT inhaler Inhale 2 puffs into the lungs every 6 (six) hours as needed for wheezing. 09/14/12  Yes Kristen N Ward, DO  albuterol (PROVENTIL) (2.5 MG/3ML) 0.083% nebulizer solution Take 3 mLs (2.5 mg total) by nebulization every 6 (six) hours as needed for wheezing. 09/14/12  Yes Kristen N Ward, DO  ALPRAZolam (XANAX) 0.5 MG tablet Take 0.5 mg by mouth 3 (three) times daily as needed. For anxiety   Yes Historical Provider, MD  meloxicam (MOBIC) 7.5 MG tablet Take 7.5 mg by mouth 2 (two) times daily as needed. For pain   Yes Historical Provider, MD   BP 95/57  Pulse 59  Temp(Src) 98.6 F (37 C) (Oral)  Resp 18   SpO2 96% Physical Exam  Nursing note and vitals reviewed. Constitutional: She is oriented to person, place, and time. She appears well-developed and well-nourished. No distress.  HENT:  Head: Normocephalic and atraumatic.  Eyes: Pupils are equal, round, and reactive to light.  Neck: Normal range of motion.  Cardiovascular: Normal rate and intact distal pulses.   Pulmonary/Chest: No respiratory distress.  Abdominal: Normal appearance. She exhibits no distension. There is tenderness in the right upper quadrant. There is no rigidity, no guarding and no tenderness at McBurney's point. No hernia.  Musculoskeletal: Normal range of motion.  Neurological: She is alert and oriented to person, place, and time. No cranial nerve deficit.  Skin: Skin is warm and dry. No rash noted.  Psychiatric: She has a normal mood and affect. Her behavior is normal.    ED Course  Procedures (including critical care time) Labs Review Labs Reviewed  CBC WITH DIFFERENTIAL - Abnormal; Notable for the following:    RBC 5.38 (*)    Hemoglobin 15.3 (*)    Neutrophils Relative % 85 (*)    Lymphocytes Relative 8 (*)    All other components within normal limits  COMPREHENSIVE METABOLIC PANEL -  Abnormal; Notable for the following:    Glucose, Bld 115 (*)    GFR calc non Af Amer 63 (*)    GFR calc Af Amer 73 (*)    All other components within normal limits  URINALYSIS, ROUTINE W REFLEX MICROSCOPIC - Abnormal; Notable for the following:    Protein, ur 30 (*)    All other components within normal limits  URINE MICROSCOPIC-ADD ON - Abnormal; Notable for the following:    Bacteria, UA FEW (*)    All other components within normal limits  LIPASE, BLOOD    Imaging Review Ultrasound abdomen showed no acute abnormality to explain patient's abdominal pain.  She did have a incidental finding of a right renal cyst.    MDM   Final diagnoses:  Abdominal pain  Renal cyst        Nelia Shi, MD 05/29/13  [phone]

## 2013-05-20 NOTE — ED Notes (Signed)
Dr Beaton at bedside 

## 2013-05-21 MED ORDER — ONDANSETRON HCL 4 MG PO TABS: 4.0000 mg | ORAL_TABLET | Freq: Four times a day (QID) | ORAL | Status: DC

## 2013-05-21 NOTE — ED Notes (Signed)
Patient continues to ask for a drink. Patient reminded she may not have anything to eat or drink until all tests have resulted and ok with provider.

## 2013-05-21 NOTE — Discharge Instructions (Signed)
Abdominal Pain, Adult °Many things can cause abdominal pain. Usually, abdominal pain is not caused by a disease and will improve without treatment. It can often be observed and treated at home. Your health care provider will do a physical exam and possibly order blood tests and X-rays to help determine the seriousness of your pain. However, in many cases, more time must pass before a clear cause of the pain can be found. Before that point, your health care provider may not know if you need more testing or further treatment. °HOME CARE INSTRUCTIONS  °Monitor your abdominal pain for any changes. The following actions may help to alleviate any discomfort you are experiencing: °· Only take over-the-counter or prescription medicines as directed by your health care provider. °· Do not take laxatives unless directed to do so by your health care provider. °· Try a clear liquid diet (broth, tea, or water) as directed by your health care provider. Slowly move to a bland diet as tolerated. °SEEK MEDICAL CARE IF: °· You have unexplained abdominal pain. °· You have abdominal pain associated with nausea or diarrhea. °· You have pain when you urinate or have a bowel movement. °· You experience abdominal pain that wakes you in the night. °· You have abdominal pain that is worsened or improved by eating food. °· You have abdominal pain that is worsened with eating fatty foods. °SEEK IMMEDIATE MEDICAL CARE IF:  °· Your pain does not go away within 2 hours. °· You have a fever. °· You keep throwing up (vomiting). °· Your pain is felt only in portions of the abdomen, such as the right side or the left lower portion of the abdomen. °· You pass bloody or black tarry stools. °MAKE SURE YOU: °· Understand these instructions.   °· Will watch your condition.   °· Will get help right away if you are not doing well or get worse.   °Document Released: 10/03/2004 Document Revised: 10/14/2012 Document Reviewed: 09/02/2012 °[employer]® Patient  Information ©2014 [employer]. ° °

## 2013-05-21 NOTE — ED Provider Notes (Signed)
12:36 AM feels better and wants to go home. [place] results shared with PT.  Tolerates PO fluids. RX provided, will f/u PCP in [place], MD 05/21/13 [phone]

## 2013-05-21 NOTE — ED Notes (Signed)
Dr. Opitz at bedside. 

## 2013-08-04 ENCOUNTER — Inpatient Hospital Stay (HOSPITAL_COMMUNITY)
Admission: EM | Admit: 2013-08-04 | Discharge: 2013-08-07 | DRG: 392 | Disposition: A | Discharge status: Home / Self Care | Payer: Medicaid Other | Attending: Internal Medicine | Admitting: Internal Medicine

## 2013-08-04 ENCOUNTER — Emergency Department (HOSPITAL_COMMUNITY): Payer: Medicaid Other

## 2013-08-04 ENCOUNTER — Encounter (HOSPITAL_COMMUNITY): Payer: Self-pay | Admitting: Emergency Medicine

## 2013-08-04 DIAGNOSIS — Z860100 Personal history of colon polyps, unspecified: Secondary | ICD-10-CM

## 2013-08-04 DIAGNOSIS — R109 Unspecified abdominal pain: Secondary | ICD-10-CM | POA: Diagnosis present

## 2013-08-04 DIAGNOSIS — F17210 Nicotine dependence, cigarettes, uncomplicated: Secondary | ICD-10-CM | POA: Diagnosis present

## 2013-08-04 DIAGNOSIS — M129 Arthropathy, unspecified: Secondary | ICD-10-CM | POA: Diagnosis present

## 2013-08-04 DIAGNOSIS — K648 Other hemorrhoids: Secondary | ICD-10-CM | POA: Diagnosis present

## 2013-08-04 DIAGNOSIS — Z79899 Other long term (current) drug therapy: Secondary | ICD-10-CM

## 2013-08-04 DIAGNOSIS — A09 Infectious gastroenteritis and colitis, unspecified: Secondary | ICD-10-CM | POA: Diagnosis present

## 2013-08-04 DIAGNOSIS — Q6101 Congenital single renal cyst: Secondary | ICD-10-CM

## 2013-08-04 DIAGNOSIS — F1210 Cannabis abuse, uncomplicated: Secondary | ICD-10-CM | POA: Diagnosis present

## 2013-08-04 DIAGNOSIS — Z720 Tobacco use: Secondary | ICD-10-CM

## 2013-08-04 DIAGNOSIS — J45909 Unspecified asthma, uncomplicated: Secondary | ICD-10-CM | POA: Diagnosis present

## 2013-08-04 DIAGNOSIS — K5900 Constipation, unspecified: Secondary | ICD-10-CM | POA: Diagnosis present

## 2013-08-04 DIAGNOSIS — R0600 Dyspnea, unspecified: Secondary | ICD-10-CM

## 2013-08-04 DIAGNOSIS — R112 Nausea with vomiting, unspecified: Secondary | ICD-10-CM | POA: Diagnosis present

## 2013-08-04 DIAGNOSIS — J438 Other emphysema: Secondary | ICD-10-CM | POA: Diagnosis present

## 2013-08-04 DIAGNOSIS — R1030 Lower abdominal pain, unspecified: Secondary | ICD-10-CM

## 2013-08-04 DIAGNOSIS — Z800 Family history of malignant neoplasm of digestive organs: Secondary | ICD-10-CM | POA: Diagnosis not present

## 2013-08-04 DIAGNOSIS — K5289 Other specified noninfective gastroenteritis and colitis: Secondary | ICD-10-CM

## 2013-08-04 DIAGNOSIS — K529 Noninfective gastroenteritis and colitis, unspecified: Secondary | ICD-10-CM | POA: Diagnosis present

## 2013-08-04 DIAGNOSIS — F17200 Nicotine dependence, unspecified, uncomplicated: Secondary | ICD-10-CM | POA: Diagnosis present

## 2013-08-04 DIAGNOSIS — R197 Diarrhea, unspecified: Secondary | ICD-10-CM

## 2013-08-04 HISTORY — DX: Unspecified hemorrhoids: K64.9

## 2013-08-04 HISTORY — DX: Unspecified osteoarthritis, unspecified site: M19.90

## 2013-08-04 HISTORY — DX: Other psychoactive substance abuse, uncomplicated: F19.10

## 2013-08-04 HISTORY — DX: Anxiety disorder, unspecified: F41.9

## 2013-08-04 LAB — CBC WITH DIFFERENTIAL/PLATELET
BASOS ABS: 0 10*3/uL (ref 0.0–0.1)
BASOS PCT: 0 % (ref 0–1)
Eosinophils Absolute: 0.1 10*3/uL (ref 0.0–0.7)
Eosinophils Relative: 2 % (ref 0–5)
HEMATOCRIT: 46.6 % — AB (ref 36.0–46.0)
Hemoglobin: 15.4 g/dL — ABNORMAL HIGH (ref 12.0–15.0)
Lymphocytes Relative: 35 % (ref 12–46)
Lymphs Abs: 1.4 10*3/uL (ref 0.7–4.0)
MCH: 27.8 pg (ref 26.0–34.0)
MCHC: 33 g/dL (ref 30.0–36.0)
MCV: 84.3 fL (ref 78.0–100.0)
MONO ABS: 0.4 10*3/uL (ref 0.1–1.0)
Monocytes Relative: 9 % (ref 3–12)
NEUTROS ABS: 2 10*3/uL (ref 1.7–7.7)
NEUTROS PCT: 54 % (ref 43–77)
PLATELETS: 257 10*3/uL (ref 150–400)
RBC: 5.53 MIL/uL — ABNORMAL HIGH (ref 3.87–5.11)
RDW: 13.1 % (ref 11.5–15.5)
WBC: 3.8 10*3/uL — AB (ref 4.0–10.5)

## 2013-08-04 LAB — COMPREHENSIVE METABOLIC PANEL
ALBUMIN: 4.1 g/dL (ref 3.5–5.2)
ALT: 18 U/L (ref 0–35)
AST: 19 U/L (ref 0–37)
Alkaline Phosphatase: 65 U/L (ref 39–117)
Anion gap: 11 (ref 5–15)
BILIRUBIN TOTAL: 0.4 mg/dL (ref 0.3–1.2)
BUN: 11 mg/dL (ref 6–23)
CHLORIDE: 106 meq/L (ref 96–112)
CO2: 29 mEq/L (ref 19–32)
CREATININE: 0.86 mg/dL (ref 0.50–1.10)
Calcium: 9.8 mg/dL (ref 8.4–10.5)
GFR calc Af Amer: 81 mL/min — ABNORMAL LOW (ref >90)
GFR calc non Af Amer: 70 mL/min — ABNORMAL LOW (ref >90)
Glucose, Bld: 95 mg/dL (ref 70–99)
Potassium: 5.4 mEq/L — ABNORMAL HIGH (ref 3.7–5.3)
SODIUM: 146 meq/L (ref 137–147)
Total Protein: 7.2 g/dL (ref 6.0–8.3)

## 2013-08-04 LAB — URINALYSIS, ROUTINE W REFLEX MICROSCOPIC
Bilirubin Urine: NEGATIVE
Glucose, UA: NEGATIVE mg/dL
Ketones, ur: NEGATIVE mg/dL
LEUKOCYTES UA: NEGATIVE
NITRITE: NEGATIVE
PH: 5.5 (ref 5.0–8.0)
Protein, ur: NEGATIVE mg/dL
Urobilinogen, UA: 0.2 mg/dL (ref 0.0–1.0)

## 2013-08-04 LAB — URINE MICROSCOPIC-ADD ON

## 2013-08-04 LAB — RAPID URINE DRUG SCREEN, HOSP PERFORMED
AMPHETAMINES: NOT DETECTED
BENZODIAZEPINES: NOT DETECTED
Barbiturates: NOT DETECTED
Cocaine: NOT DETECTED
OPIATES: NOT DETECTED
TETRAHYDROCANNABINOL: NOT DETECTED

## 2013-08-04 LAB — LIPASE, BLOOD: LIPASE: 28 U/L (ref 11–59)

## 2013-08-04 MED ORDER — ONDANSETRON HCL 4 MG/2ML IJ SOLN: 4.0000 mg | Freq: Four times a day (QID) | INTRAMUSCULAR | Status: DC | PRN

## 2013-08-04 MED ORDER — CIPROFLOXACIN IN D5W 400 MG/200ML IV SOLN
400.0000 mg | Freq: Once | INTRAVENOUS | Status: AC
  Administered 2013-08-04: 400 mg via INTRAVENOUS
  Filled 2013-08-04: qty 200

## 2013-08-04 MED ORDER — QUETIAPINE FUMARATE 300 MG PO TABS
300.0000 mg | ORAL_TABLET | Freq: Every day | ORAL | Status: DC
  Administered 2013-08-05: 100 mg via ORAL
  Filled 2013-08-04 (×5): qty 1

## 2013-08-04 MED ORDER — GABAPENTIN 300 MG PO CAPS
300.0000 mg | ORAL_CAPSULE | Freq: Two times a day (BID) | ORAL | Status: DC
  Administered 2013-08-04 – 2013-08-07 (×6): 300 mg via ORAL
  Filled 2013-08-04 (×7): qty 1

## 2013-08-04 MED ORDER — SODIUM CHLORIDE 0.9 % IJ SOLN
3.0000 mL | Freq: Two times a day (BID) | INTRAMUSCULAR | Status: DC
  Administered 2013-08-04 – 2013-08-07 (×4): 3 mL via INTRAVENOUS

## 2013-08-04 MED ORDER — ALBUTEROL SULFATE (2.5 MG/3ML) 0.083% IN NEBU: 2.5000 mg | INHALATION_SOLUTION | Freq: Four times a day (QID) | RESPIRATORY_TRACT | Status: DC | PRN

## 2013-08-04 MED ORDER — CIPROFLOXACIN IN D5W 400 MG/200ML IV SOLN
400.0000 mg | Freq: Two times a day (BID) | INTRAVENOUS | Status: DC
  Administered 2013-08-04 – 2013-08-07 (×6): 400 mg via INTRAVENOUS
  Filled 2013-08-04 (×7): qty 200

## 2013-08-04 MED ORDER — NICOTINE 21 MG/24HR TD PT24
21.0000 mg | MEDICATED_PATCH | Freq: Every day | TRANSDERMAL | Status: DC
  Administered 2013-08-04 – 2013-08-07 (×4): 21 mg via TRANSDERMAL
  Filled 2013-08-04 (×4): qty 1

## 2013-08-04 MED ORDER — ONDANSETRON HCL 4 MG/2ML IJ SOLN
4.0000 mg | Freq: Once | INTRAMUSCULAR | Status: AC
  Administered 2013-08-04: 4 mg via INTRAVENOUS
  Filled 2013-08-04: qty 2

## 2013-08-04 MED ORDER — ALBUTEROL SULFATE HFA 108 (90 BASE) MCG/ACT IN AERS: 2.0000 | INHALATION_SPRAY | Freq: Four times a day (QID) | RESPIRATORY_TRACT | Status: DC | PRN

## 2013-08-04 MED ORDER — SODIUM CHLORIDE 0.9 % IV BOLUS (SEPSIS)
1000.0000 mL | Freq: Once | INTRAVENOUS | Status: AC
  Administered 2013-08-04: 1000 mL via INTRAVENOUS

## 2013-08-04 MED ORDER — KETOROLAC TROMETHAMINE 30 MG/ML IJ SOLN
30.0000 mg | Freq: Once | INTRAMUSCULAR | Status: AC
  Administered 2013-08-04: 30 mg via INTRAVENOUS
  Filled 2013-08-04: qty 1

## 2013-08-04 MED ORDER — SODIUM CHLORIDE 0.9 % IJ SOLN: 3.0000 mL | INTRAMUSCULAR | Status: DC | PRN

## 2013-08-04 MED ORDER — ACETAMINOPHEN 650 MG RE SUPP: 650.0000 mg | Freq: Four times a day (QID) | RECTAL | Status: DC | PRN

## 2013-08-04 MED ORDER — OXYCODONE-ACETAMINOPHEN 5-325 MG PO TABS
1.0000 | ORAL_TABLET | Freq: Three times a day (TID) | ORAL | Status: DC | PRN
  Administered 2013-08-04 – 2013-08-07 (×7): 1 via ORAL
  Filled 2013-08-04 (×7): qty 1

## 2013-08-04 MED ORDER — METRONIDAZOLE IN NACL 5-0.79 MG/ML-% IV SOLN
500.0000 mg | Freq: Once | INTRAVENOUS | Status: AC
  Administered 2013-08-04: 500 mg via INTRAVENOUS
  Filled 2013-08-04: qty 100

## 2013-08-04 MED ORDER — ONDANSETRON 4 MG PO TBDP
8.0000 mg | ORAL_TABLET | Freq: Once | ORAL | Status: AC
  Administered 2013-08-04: 8 mg via ORAL
  Filled 2013-08-04: qty 2

## 2013-08-04 MED ORDER — MORPHINE SULFATE 2 MG/ML IJ SOLN
2.0000 mg | INTRAMUSCULAR | Status: DC | PRN
  Administered 2013-08-04 (×2): 2 mg via INTRAVENOUS
  Filled 2013-08-04 (×2): qty 1

## 2013-08-04 MED ORDER — DICYCLOMINE HCL 20 MG PO TABS
20.0000 mg | ORAL_TABLET | Freq: Three times a day (TID) | ORAL | Status: DC
  Administered 2013-08-04 – 2013-08-07 (×10): 20 mg via ORAL
  Filled 2013-08-04 (×16): qty 1

## 2013-08-04 MED ORDER — ONDANSETRON HCL 4 MG PO TABS: 4.0000 mg | ORAL_TABLET | Freq: Four times a day (QID) | ORAL | Status: DC | PRN

## 2013-08-04 MED ORDER — SODIUM CHLORIDE 0.9 % IV SOLN: 250.0000 mL | INTRAVENOUS | Status: DC | PRN

## 2013-08-04 MED ORDER — METRONIDAZOLE IN NACL 5-0.79 MG/ML-% IV SOLN
500.0000 mg | Freq: Three times a day (TID) | INTRAVENOUS | Status: DC
  Administered 2013-08-04 – 2013-08-07 (×9): 500 mg via INTRAVENOUS
  Filled 2013-08-04 (×10): qty 100

## 2013-08-04 MED ORDER — SODIUM CHLORIDE 0.9 % IV SOLN
INTRAVENOUS | Status: AC
  Administered 2013-08-04: 14:00:00 via INTRAVENOUS

## 2013-08-04 MED ORDER — ACETAMINOPHEN 325 MG PO TABS: 650.0000 mg | ORAL_TABLET | Freq: Four times a day (QID) | ORAL | Status: DC | PRN

## 2013-08-04 MED ORDER — CYCLOBENZAPRINE HCL 10 MG PO TABS: 10.0000 mg | ORAL_TABLET | Freq: Three times a day (TID) | ORAL | Status: DC | PRN

## 2013-08-04 MED ORDER — IOHEXOL 300 MG/ML  SOLN
100.0000 mL | Freq: Once | INTRAMUSCULAR | Status: AC | PRN
  Administered 2013-08-04: 100 mL via INTRAVENOUS

## 2013-08-04 NOTE — Progress Notes (Signed)
Utilization review completed.  

## 2013-08-04 NOTE — ED Notes (Signed)
Pt to CT scan via stretcher with transport

## 2013-08-04 NOTE — H&P (Signed)
[facility] History and Physical  Dawn Lane ZLD:[id number] DOB: [birth year] DOA: 08/04/2013  Referring physician:  PCP: Dr. Eula Listen  Specialists:   Chief Complaint: Abdominal pain with nausea/ vomiting  HPI: Dawn Lane is a 63 y.o. female  With a history of asthma and COPD, family history of colon cancer that presents to the emergency department with abdominal pain, nausea, vomiting, diarrhea bright red blood per rectum. Patient states she's had a blood per rectum for approximately 8 months. Patient states she has not seen any physician for this. Patient also states she's never had a colonoscopy, although she does have a history of colon cancer. Patient states she started having nausea, vomiting, diarrhea, abdominal pain that started yesterday evening. She thought this was food poisoning, however, her abdominal pain has not improved.  She is unable to keep any food down.  She states her vomiting feels like poison.  She denies any blood in her vomit.  She denies any sick contact, recent illness, fever, chills.  She states she has a crampy type of pain which is located moreso on the left side of her abdomen.  Nothing seems to make the pain better or worse.   Review of Systems:  Constitutional: Denies fever, chills, diaphoresis, appetite change and fatigue.  HEENT: Denies photophobia, eye pain, redness, hearing loss, ear pain, congestion, sore throat, rhinorrhea, sneezing, mouth sores, trouble swallowing, neck pain, neck stiffness and tinnitus.   Respiratory: Denies SOB, DOE, cough, chest tightness,  and wheezing.   Cardiovascular: Denies chest pain, palpitations and leg swelling.  Gastrointestinal: Complains of abdominal pain, nausea, vomiting and diarrhea with bright red blood per rectum. Genitourinary: Denies dysuria, urgency, frequency, hematuria, flank pain and difficulty urinating.  Musculoskeletal: Denies myalgias, back pain, joint swelling, arthralgias and gait problem.  Skin:  Denies pallor, rash and wound.  Neurological: Denies dizziness, seizures, syncope, weakness, light-headedness, numbness and headaches.  Hematological: Denies adenopathy. Easy bruising, personal or family bleeding history  Psychiatric/Behavioral: Denies suicidal ideation, mood changes, confusion, nervousness, sleep disturbance and agitation  Past Medical History  Diagnosis Date  . Emphysema   . Asthma    History reviewed. No pertinent past surgical history. Social History:  reports that she has been smoking Cigarettes.  She has a 25 pack-year smoking history. She has never used smokeless tobacco. She reports that she drinks alcohol. She reports that she uses illicit drugs (Marijuana).   Allergies  Allergen Reactions  . Lorazepam Other (See Comments)    Cardiac arrest  . Vicodin [Hydrocodone-Acetaminophen] Swelling    Swelling to feet    Family History  Problem Relation Age of Onset  . Emphysema Father   . Asthma Sister   . Lung cancer Father     was a smoker  . Heart disease Father   . Uterine cancer Mother   . Prostate cancer Father     Prior to Admission medications   Medication Sig Start Date End Date Taking? Authorizing Provider  albuterol (PROVENTIL HFA;VENTOLIN HFA) 108 (90 BASE) MCG/ACT inhaler Inhale 2 puffs into the lungs every 6 (six) hours as needed for wheezing. 09/14/12  Yes Kristen N Ward, DO  albuterol (PROVENTIL) (2.5 MG/3ML) 0.083% nebulizer solution Take 3 mLs (2.5 mg total) by nebulization every 6 (six) hours as needed for wheezing. 09/14/12  Yes Kristen N Ward, DO  cyclobenzaprine (FLEXERIL) 10 MG tablet Take 10 mg by mouth 3 (three) times daily as needed for muscle spasms.   Yes Historical Provider, MD  gabapentin (NEURONTIN) 300 MG capsule  Take 300 mg by mouth 2 (two) times daily.   Yes Historical Provider, MD  meloxicam (MOBIC) 7.5 MG tablet Take 7.5 mg by mouth 2 (two) times daily as needed. For pain   Yes Historical Provider, MD  oxyCODONE-acetaminophen  (PERCOCET/ROXICET) 5-325 MG per tablet Take 1 tablet by mouth 3 (three) times daily as needed for severe pain.   Yes Historical Provider, MD  QUEtiapine (SEROQUEL) 300 MG tablet Take 300 mg by mouth at bedtime.   Yes Historical Provider, MD   Physical Exam: Filed Vitals:   08/04/13 1425  BP: 133/86  Pulse: 55  Temp:   Resp: 22     General: Well developed, well nourished, NAD, appears stated age  HEENT: NCAT, PERRLA, EOMI, Anicteic Sclera, mucous membranes moist. Poor dentition.  Neck: Supple, no JVD, no masses  Cardiovascular: S1 S2 auscultated, no rubs, murmurs or gallops. Regular rate and rhythm.  Respiratory: Clear to auscultation bilaterally with equal chest rise  Abdomen: Soft, LLQ and LUQ tenderness , nondistended, + bowel sounds, no rebound or guarding  Extremities: warm dry without cyanosis clubbing or edema  Neuro: AAOx3, cranial nerves grossly intact. Strength 5/5 in patient's upper and lower extremities bilaterally  Skin: Without rashes exudates or nodules  Psych: Normal affect and demeanor with intact judgement and insight  Labs on Admission:  Basic Metabolic Panel:  Recent Labs Lab 08/04/13 0930  NA 146  K 5.4*  CL 106  CO2 29  GLUCOSE 95  BUN 11  CREATININE 0.86  CALCIUM 9.8   Liver Function Tests:  Recent Labs Lab 08/04/13 0930  AST 19  ALT 18  ALKPHOS 65  BILITOT 0.4  PROT 7.2  ALBUMIN 4.1    Recent Labs Lab 08/04/13 0930  LIPASE 28   No results found for this basename: AMMONIA,  in the last 168 hours CBC:  Recent Labs Lab 08/04/13 0930  WBC 3.8*  NEUTROABS 2.0  HGB 15.4*  HCT 46.6*  MCV 84.3  PLT 257   Cardiac Enzymes: No results found for this basename: CKTOTAL, CKMB, CKMBINDEX, TROPONINI,  in the last 168 hours  BNP (last 3 results)  Recent Labs  09/14/12 1151  PROBNP 80.2   CBG: No results found for this basename: GLUCAP,  in the last 168 hours  Radiological Exams on Admission: Ct Abdomen Pelvis W  Contrast  08/04/2013   CLINICAL DATA:  Left lower quadrant pain and diarrhea. Possible diverticulitis.  EXAM: CT ABDOMEN AND PELVIS WITH CONTRAST  TECHNIQUE: Multidetector CT imaging of the abdomen and pelvis was performed using the standard protocol following bolus administration of intravenous contrast.  CONTRAST:  OMNIPAQUE IOHEXOL 300 MG/ML  SOLN  COMPARISON:  Ultrasound 05/20/2013  FINDINGS: Lung bases are clear except for a few linear scars. No pleural or pericardial fluid.  The liver has a normal appearance. No calcified gallstones. The spleen is normal. The pancreas is normal. The adrenal glands are normal. The left kidney is normal. The right kidney shows a 7 mm cyst at the upper pole and a 3.6 cm cyst at the lower pole. The aorta and IVC are unremarkable. No retroperitoneal mass or lymphadenopathy. No free intraperitoneal fluid or air. There is abnormal edema affecting the colon from the cecum to the splenic flexure. The left colon appears normal. The small bowel appears normal. 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 No adnexal mass. There are ordinary degenerative changes of the lumbar spine.  IMPRESSION: Colitis affecting the colon from the cecum to the  hepatic flexure. This could be due to infectious or inflammatory colitis. The possibility of ischemic colitis is less likely. The left colon appears largely spared.   Electronically Signed   By: [name] M.D.   On: 08/04/2013 12:44    EKG: None  Assessment/Plan  Abdominal pain with nausea and vomiting secondary to Colitis -Patient will be admitted to medical floor. -CT of the abdomen/pelvis shows colitis affecting the colon from the cecum to the hepatic flexure could be infectious or inflammatory. -Will start patient on IV Flagyl and ciprofloxacin. -Will place her on a clear liquid diet and advance as tolerated.  -Will give her antiemetics as needed for nausea. -Will also order a GI pathogen panel. -Will consult  gastroenterology. -Patient will need a colonoscopy.  Tobacco abuse -Will place patient on nicotine patch. -Smoking cessation counseling given.  COPD -Currently compensated. Continue albuterol as needed.  Arthritis -Will hold meloxicam at this time. Will continue her on oxycodone/acetaminophen and morphine PRN.   DVT prophylaxis: SCDs  Code Status: Full  Condition: Guarded  Family Communication: None at bedside. Admission, patients condition and plan of care including tests being ordered have been discussed with the patient, who indicates understanding and agrees with the plan and Code Status. Spoke with daughter via phone.  Disposition Plan: Admitted   Time spent: 60 minutes  Emslee Lopezmartinez D.O. [facility] Pager [phone]  If 7PM-7AM, please contact night-coverage [email or web address] Password [facility] 08/04/2013, 2:33 PM

## 2013-08-04 NOTE — Progress Notes (Signed)
Received patient assignment at 42. ED called for report at 1429. Awaiting patient arrival.

## 2013-08-04 NOTE — Progress Notes (Signed)
Pt admitted to the unit at 1439. Pt mental status is A&Ox4. Pt oriented to room, staff, and call bell. Skin is intact. Full assessment charted in [facility]. Call bell within reach. Visitor guidelines reviewed w/ pt and/or family.

## 2013-08-04 NOTE — ED Notes (Signed)
Pt arrived by [facility] for possible food poisoning. Reports eating fish last night and now having n/v/d since this am.

## 2013-08-04 NOTE — ED Provider Notes (Signed)
CSN: [id number]     Arrival date & time 08/04/13  9562 History   First MD Initiated Contact with Patient 08/04/13 1133     Chief Complaint  Patient presents with  . Emesis  . Diarrhea     (Consider location/radiation/quality/duration/timing/severity/associated sxs/prior Treatment) HPI Comments: Patient is a 63 year old female with history of emphysema. She presents with complaints of nausea, vomiting, diarrhea, and abdominal cramping since last night. She ate fish for dinner and feels as though she may have food poisoning. She denies any fevers or chills. She does report some bloody diarrhea. She denies to me she has ever had a colonoscopy.  Patient is a 63 y.o. female presenting with vomiting. The history is provided by the patient.  Emesis Severity:  Moderate Duration:  12 hours Timing:  Constant Quality:  Stomach contents and bilious material Progression:  Worsening Chronicity:  New Relieved by:  Nothing Worsened by:  Nothing tried Ineffective treatments:  None tried Associated symptoms: abdominal pain   Associated symptoms: no fever     Past Medical History  Diagnosis Date  . Emphysema   . Asthma    History reviewed. No pertinent past surgical history. Family History  Problem Relation Age of Onset  . Emphysema Father   . Asthma Sister   . Lung cancer Father     was a smoker  . Heart disease Father   . Uterine cancer Mother   . Prostate cancer Father    History  Substance Use Topics  . Smoking status: Current Every Day Smoker -- 0.50 packs/day for 50 years    Types: Cigarettes  . Smokeless tobacco: Never Used  . Alcohol Use: Yes     Comment: occasionally   OB History   Grav Para Term Preterm Abortions TAB SAB Ect Mult Living                 Review of Systems  Gastrointestinal: Positive for vomiting and abdominal pain.  All other systems reviewed and are negative.     Allergies  Lorazepam and Vicodin  Home Medications   Prior to Admission  medications   Medication Sig Start Date End Date Taking? Authorizing Provider  albuterol (PROVENTIL HFA;VENTOLIN HFA) 108 (90 BASE) MCG/ACT inhaler Inhale 2 puffs into the lungs every 6 (six) hours as needed for wheezing. 09/14/12  Yes Kristen N Ward, DO  albuterol (PROVENTIL) (2.5 MG/3ML) 0.083% nebulizer solution Take 3 mLs (2.5 mg total) by nebulization every 6 (six) hours as needed for wheezing. 09/14/12  Yes Kristen N Ward, DO  cyclobenzaprine (FLEXERIL) 10 MG tablet Take 10 mg by mouth 3 (three) times daily as needed for muscle spasms.   Yes Historical Provider, MD  gabapentin (NEURONTIN) 300 MG capsule Take 300 mg by mouth 2 (two) times daily.   Yes Historical Provider, MD  meloxicam (MOBIC) 7.5 MG tablet Take 7.5 mg by mouth 2 (two) times daily as needed. For pain   Yes Historical Provider, MD  oxyCODONE-acetaminophen (PERCOCET/ROXICET) 5-325 MG per tablet Take 1 tablet by mouth 3 (three) times daily as needed for severe pain.   Yes Historical Provider, MD  QUEtiapine (SEROQUEL) 300 MG tablet Take 300 mg by mouth at bedtime.   Yes Historical Provider, MD   BP 126/95  Pulse 65  Temp(Src) 97.7 F (36.5 C) (Oral)  Resp 16  Ht 5\' 6"  (1.676 m)  Wt 139 lb (63.05 kg)  BMI 22.45 kg/m2  SpO2 100% Physical Exam  Nursing note and vitals reviewed. Constitutional: She  is oriented to person, place, and time. She appears well-developed and well-nourished. No distress.  HENT:  Head: Normocephalic and atraumatic.  Neck: Normal range of motion. Neck supple.  Cardiovascular: Normal rate and regular rhythm.  Exam reveals no gallop and no friction rub.   No murmur heard. Pulmonary/Chest: Effort normal and breath sounds normal. No respiratory distress. She has no wheezes.  Abdominal: Soft. Bowel sounds are normal. She exhibits no distension. There is tenderness.  There is tenderness to palpation in the left upper and left lower quadrants. There is no rebound and no guarding.  Musculoskeletal: Normal  range of motion.  Neurological: She is alert and oriented to person, place, and time.  Skin: Skin is warm and dry. She is not diaphoretic.    ED Course  Procedures (including critical care time) Labs Review Labs Reviewed  CBC WITH DIFFERENTIAL - Abnormal; Notable for the following:    WBC 3.8 (*)    RBC 5.53 (*)    Hemoglobin 15.4 (*)    HCT 46.6 (*)    All other components within normal limits  COMPREHENSIVE METABOLIC PANEL - Abnormal; Notable for the following:    Potassium 5.4 (*)    GFR calc non Af Amer 70 (*)    GFR calc Af Amer 81 (*)    All other components within normal limits  LIPASE, BLOOD  URINALYSIS, ROUTINE W REFLEX MICROSCOPIC    Imaging Review Ct Abdomen Pelvis W Contrast  08/04/2013   CLINICAL DATA:  Left lower quadrant pain and diarrhea. Possible diverticulitis.  EXAM: CT ABDOMEN AND PELVIS WITH CONTRAST  TECHNIQUE: Multidetector CT imaging of the abdomen and pelvis was performed using the standard protocol following bolus administration of intravenous contrast.  CONTRAST:  OMNIPAQUE IOHEXOL 300 MG/ML  SOLN  COMPARISON:  Ultrasound 05/20/2013  FINDINGS: Lung bases are clear except for a few linear scars. No pleural or pericardial fluid.  The liver has a normal appearance. No calcified gallstones. The spleen is normal. The pancreas is normal. The adrenal glands are normal. The left kidney is normal. The right kidney shows a 7 mm cyst at the upper pole and a 3.6 cm cyst at the lower pole. The aorta and IVC are unremarkable. No retroperitoneal mass or lymphadenopathy. No free intraperitoneal fluid or air. There is abnormal edema affecting the colon from the cecum to the splenic flexure. The left colon appears normal. The small bowel appears normal. 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 No adnexal mass. There are ordinary degenerative changes of the lumbar spine.  IMPRESSION: Colitis affecting the colon from the cecum to the hepatic flexure. This could be due  to infectious or inflammatory colitis. The possibility of ischemic colitis is less likely. The left colon appears largely spared.   Electronically Signed   By: [name] M.D.   On: 08/04/2013 12:44     EKG Interpretation None      MDM   Final diagnoses:  None    Patient presents with abdominal cramping, nausea, vomiting, and bloody diarrhea. Workup reveals no elevation of white count and stable vital signs. Her CT scan does show a colitis extending from the cecum to the hepatic flexure. She'll be treated with Cipro and Flagyl for a presumed infectious colitis and admitted to the medical service she does not feel well enough to go home. Dr. Catha Gosselin agrees to admit.    Geoffery Lyons, MD 08/04/13 [phone]

## 2013-08-04 NOTE — Consult Note (Cosign Needed)
[name]: 3:04 PM 08/04/2013  LOS: 0 days    Referring Provider: Edsel Petrin, DO Primary Care Physician:  Dr. Eula Listen Primary Gastroenterologist:  Hasn't been followed by one in years.    Reason for Consultation:  BRBPR   HPI: On first attempt to speak to patient, she had snuck out of her room and went outside to smoke, despite being on Enteric precautions.      Patient is a poor historian and her complaints seemed to change over time.   Dawn Lane is a 63 y.o. female with a history of asthma, COPD, chronic constipation, and hemorrhoids who presented to the [facility] with ABD pain, N/V, diarrhea and bright red blood per rectum today.  She states she had a meal of fish and onion rings late last night, and was worried she had food poisoning, so came to ED via EMS this morning. She has had several episodes of bloody diarrhea since last night. She attributed the diarrhea to her meal last night, but she has had BRBPR, intermittent abdominal pain, bloating, and N/V on and off for several years. She states after meals her stomach bloats, sometimes to the point of causing her to vomit, she has severe cramping pains. She describes her vomitus three different ways; "brown and mucous", "clear", and "the consistency of baby food".  When asked about this inconsistency she states it is mostly liquid with some small pieces of food. She initially endorses dysphagia, saying even her saliva sticks in her throat, but then states she has no problem swallowing meat or any liquids.  She states she has also been so constipated for years that she usually has to help move her bowels with her finger, sometimes causing her to bleed. She has had an I&D of hemorrhoids in the past, and feels like they have returned and "hang out" of her anus at times. She reports some relief  from constipation this past year from using Milk of Magnesia and attempting to stay better hydrated.   She endorses a 10 lb weight loss over the past three months with no attempt at diet or exercise.  She does not cite any certain foods as worsening her pain.  She does report some relief from belching and having a bowel movement. She denies fever, SOB, CP, and fatigue. When asked about chills pt states she is always "either hot or cold."  She states she had a colonoscopy positive for polyps several years ago in [facility], but is unsure of her provider. She has never had a EGD.  Past Medical History  Diagnosis Date  . Emphysema   . Asthma     History reviewed. No pertinent past surgical history.  Prior to Admission medications   Medication Sig Start Date End Date Taking? Authorizing Provider  albuterol (PROVENTIL HFA;VENTOLIN HFA) 108 (90 BASE) MCG/ACT inhaler Inhale 2 puffs into the lungs every 6 (six) hours as needed for wheezing. 09/14/12  Yes Kristen N Ward, DO  albuterol (PROVENTIL) (2.5 MG/3ML) 0.083% nebulizer solution Take 3 mLs (2.5 mg total) by nebulization  every 6 (six) hours as needed for wheezing. 09/14/12  Yes Kristen N Ward, DO  cyclobenzaprine (FLEXERIL) 10 MG tablet Take 10 mg by mouth 3 (three) times daily as needed for muscle spasms.   Yes Historical Provider, MD  gabapentin (NEURONTIN) 300 MG capsule Take 300 mg by mouth 2 (two) times daily.   Yes Historical Provider, MD  meloxicam (MOBIC) 7.5 MG tablet Take 7.5 mg by mouth 2 (two) times daily as needed. For pain   Yes Historical Provider, MD  oxyCODONE-acetaminophen (PERCOCET/ROXICET) 5-325 MG per tablet Take 1 tablet by mouth 3 (three) times daily as needed for severe pain.   Yes Historical Provider, MD  QUEtiapine (SEROQUEL) 300 MG tablet Take 300 mg by mouth at bedtime.   Yes Historical Provider, MD    Scheduled Meds: . sodium chloride   Intravenous STAT  . ciprofloxacin  400 mg Intravenous Q12H  . gabapentin  300 mg  Oral BID  . metronidazole  500 mg Intravenous Q8H  . nicotine  21 mg Transdermal Daily  . QUEtiapine  300 mg Oral QHS  . sodium chloride  3 mL Intravenous Q12H   Infusions:   PRN Meds: sodium chloride, acetaminophen, acetaminophen, albuterol, cyclobenzaprine, morphine injection, ondansetron (ZOFRAN) IV, ondansetron, oxyCODONE-acetaminophen, sodium chloride   Allergies as of 08/04/2013 - Review Complete 08/04/2013  Allergen Reaction Noted  . Lorazepam Other (See Comments) 08/02/2010  . Vicodin [hydrocodone-acetaminophen] Swelling 12/11/2011    Family History  Problem Relation Age of Onset  . Emphysema Father   . Asthma Sister   . Lung cancer Father     was a smoker  . Heart disease Father   . Uterine cancer Mother   . Prostate cancer Father     History   Social History  . Marital Status: Single    Spouse Name: N/A    Number of Children: 3  . Years of Education: N/A   Occupational History  . Unemployed    Social History Main Topics  . Smoking status: Current Every Day Smoker -- 0.50 packs/day for 50 years    Types: Cigarettes  . Smokeless tobacco: Never Used  . Alcohol Use: Yes     Comment: occasionally  . Drug Use: Yes    Special: Marijuana     Comment: May 16, 2013  . Sexual Activity: Yes    Birth Control/ Protection: None   Other Topics Concern  . Not on file   Social History Narrative  . No narrative on file    REVIEW OF SYSTEMS: Constitutional:  + Weight loss, +Night sweats Negative fatigue, fever, chills. ENT:  No nose bleeds Pulm:  Denies SOB or CP currently CV:  No palpitations, no LE edema.  GU:  No hematuria, no frequency GI: + Constipation, + hemorrhoids, + BRBPR Neuro:  No headaches, no peripheral tingling or numbness Derm:  No itching, no rash or sores.  Endocrine: No polyuria or dysuria Immunization:  Pt doesn't think she has had any since she was a child. Travel:  None beyond local counties in last few months.    PHYSICAL  EXAM: Vital signs in last 24 hours: Filed Vitals:   08/04/13 1425  BP: 133/86  Pulse: 55  Temp:   Resp: 22   Wt Readings from Last 3 Encounters:  08/04/13 139 lb (63.05 kg)  08/02/10 150 lb (68.04 kg)    General: Thin, AA female in NAD HEENT: Normocephalic, Atraumatic.  Without mass or lesion.  Membranes pink and moist. Lungs:  CTA  bilaterally.  Unlabored. Heart: RRR, No MGR noted Abdomen:  Moderately distended. Diffusely tender, worse in LLQ. +BS. Negative Rebound. No succussion splash appreciated. No HSM appreciated but limited due to bloating.  Large transverse incisional scar lower abdomen from tubal ligation in 1972. Rectal: +External Hemorrhoid, +Soft bulge internally, possible internal.  hemorrhoid. No active bleeding. Performed with female chaperone, Arville LimeAdrienne L Windham, RN Musc/Skeltl: No gross abnormalities Extremities: No peripheral edema.  2+ DP and radial pulses. Neurologic:  A&O to person, place, and time. Skin:  Without rash Psych:  Normal mood and affect.  Cooperative.  Intake/Output from previous day:   Intake/Output this shift:    LAB RESULTS:  Recent Labs  08/04/13 0930  WBC 3.8*  HGB 15.4*  HCT 46.6*  PLT 257   BMET Lab Results  Component Value Date   NA 146 08/04/2013   NA 142 05/20/2013   NA 138 09/14/2012   K 5.4* 08/04/2013   K 3.8 05/20/2013   K 4.4 09/14/2012   CL 106 08/04/2013   CL 101 05/20/2013   CL 102 09/14/2012   CO2 29 08/04/2013   CO2 29 05/20/2013   CO2 27 09/14/2012   GLUCOSE 95 08/04/2013   GLUCOSE 115* 05/20/2013   GLUCOSE 106* 09/14/2012   BUN 11 08/04/2013   BUN 16 05/20/2013   BUN 10 09/14/2012   CREATININE 0.86 08/04/2013   CREATININE 0.94 05/20/2013   CREATININE 0.90 09/14/2012   CALCIUM 9.8 08/04/2013   CALCIUM 9.8 05/20/2013   CALCIUM 9.8 09/14/2012   LFT  Recent Labs  08/04/13 0930  PROT 7.2  ALBUMIN 4.1  AST 19  ALT 18  ALKPHOS 65  BILITOT 0.4   Lipase     Component Value Date/Time   LIPASE 28 08/04/2013 0930      RADIOLOGY STUDIES: Ct Abdomen Pelvis W Contrast  08/04/2013   CLINICAL DATA:  Left lower quadrant pain and diarrhea. Possible diverticulitis.  EXAM: CT ABDOMEN AND PELVIS WITH CONTRAST  TECHNIQUE: Multidetector CT imaging of the abdomen and pelvis was performed using the standard protocol following bolus administration of intravenous contrast.  CONTRAST:  100mL OMNIPAQUE IOHEXOL 300 MG/ML  SOLN  COMPARISON:  Ultrasound 05/20/2013  FINDINGS: Lung bases are clear except for a few linear scars. No pleural or pericardial fluid.  The liver has a normal appearance. No calcified gallstones. The spleen is normal. The pancreas is normal. The adrenal glands are normal. The left kidney is normal. The right kidney shows a 7 mm cyst at the upper pole and a 3.6 cm cyst at the lower pole. The aorta and IVC are unremarkable. No retroperitoneal mass or lymphadenopathy. No free intraperitoneal fluid or air. There is abnormal edema affecting the colon from the cecum to the splenic flexure. The left colon appears normal. The small bowel appears normal. 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 No adnexal mass. There are ordinary degenerative changes of the lumbar spine.  IMPRESSION: Colitis affecting the colon from the cecum to the hepatic flexure. This could be due to infectious or inflammatory colitis. The possibility of ischemic colitis is less likely. The left colon appears largely spared.   Electronically Signed   By: Paulina FusiMark  Shogry M.D.   On: 08/04/2013 12:44    ENDOSCOPIC STUDIES: -Colonoscopy in [place] "3-4 years" ago per patient. -Denies hx of endoscopy IMPRESSION:   - Diarrhea - CT scan shows colitis from the cecum to hepatic flexure.  No signs of systemic infection. WBC low at 3.8 - Bright  red blood per rectum - Chronic with hx of hemorrhoids.  Usually brought on by assisting BMs with her finger.  Patient is not anemic. - Nausea with vomiting - Chronic. Often gets bloated leading to Nausea and  sometimes vomiting after meals. - Constipation - Chronic as well. Some relief from milk of magnesia.      PLAN:     - Due to poor history of GI f/u, CT findings, and symptoms, may benefit from inpatient Colonoscopy. - Monitor for hydration status with any further episodes of diarrhea. Antiemetics as needed.  - Other problems sound chronic and may not need further inpatient GI work up.  Patient may need outpatient treatment for hemorrhoids, and try a new bowel regimen for her constipation. - Will consult with MD for further inpatient recommendations.   Doreatha Martin  08/04/2013, 3:04 PM Florence Canner, PA-Student [place] Pager: [phone]     I have personally seen the patient, reviewed and repeated key elements of the history and physical and participated in formation of the assessment and plan the student has documented.   She has an acute illness with diarrhea and CT showing right colitis. Vessels in abd look ok making ischemia less likely  Will Tx w/ dicyclomine and the Abx. Chronic rectal bleeding noted  ? Hx colon polyps  Depending upon clinical course colonoscopy here or outpatient.  Go to full liquids.  Iva Boop, MD, [facility] [phone] (pager) 08/04/2013 5:30 PM

## 2013-08-05 DIAGNOSIS — R109 Unspecified abdominal pain: Secondary | ICD-10-CM

## 2013-08-05 LAB — BASIC METABOLIC PANEL
Anion gap: 10 (ref 5–15)
BUN: 7 mg/dL (ref 6–23)
CALCIUM: 8.6 mg/dL (ref 8.4–10.5)
CO2: 25 mEq/L (ref 19–32)
CREATININE: 0.86 mg/dL (ref 0.50–1.10)
Chloride: 107 mEq/L (ref 96–112)
GFR calc Af Amer: 81 mL/min — ABNORMAL LOW (ref >90)
GFR, EST NON AFRICAN AMERICAN: 70 mL/min — AB (ref >90)
GLUCOSE: 92 mg/dL (ref 70–99)
Potassium: 4 mEq/L (ref 3.7–5.3)
Sodium: 142 mEq/L (ref 137–147)

## 2013-08-05 LAB — CBC
HCT: 40.6 % (ref 36.0–46.0)
Hemoglobin: 12.9 g/dL (ref 12.0–15.0)
MCH: 27 pg (ref 26.0–34.0)
MCHC: 31.8 g/dL (ref 30.0–36.0)
MCV: 84.9 fL (ref 78.0–100.0)
Platelets: 222 10*3/uL (ref 150–400)
RBC: 4.78 MIL/uL (ref 3.87–5.11)
RDW: 13.4 % (ref 11.5–15.5)
WBC: 2.8 10*3/uL — ABNORMAL LOW (ref 4.0–10.5)

## 2013-08-05 NOTE — Progress Notes (Signed)
[facility]                                                                              Patient Demographics  Dawn Lane, is a 63 y.o. female, DOB - [birth year], PVV:[id number]  Admit date - 08/04/2013   Admitting Physician Edsel Petrin, DO  Outpatient Primary MD for the patient is Default, Provider, MD  LOS - 1   Chief Complaint  Patient presents with  . Emesis  . Diarrhea      HPI on 08/04/2013:  Dawn Lane is a 63 y.o. female with a history of asthma and COPD, family history of colon cancer that presents to the emergency department with abdominal pain, nausea, vomiting, diarrhea bright red blood per rectum. Patient stated she has had bright red blood per rectum for approximately 8 months. Patient stated she has not seen any physician for this. She has never had a colonoscopy, although she does have a history of colon cancer. She started having nausea, vomiting, diarrhea, abdominal pain that started the evening prior to admission. She thought it was food poisoning, however, her abdominal pain has not improved. She has been unable to keep any food down. She stated her vomiting felt like poison. She denied any blood in her vomit. She denied any sick contacts, recent illness, fever, chills. She stated she has a crampy type of pain which is located moreso on the left side of her abdomen. Nothing seemed to make the pain better or worse.    Assessment & Plan  Abdominal pain with nausea and vomiting secondary to Colitis  -Improving -CT of the abdomen/pelvis shows colitis affecting the colon from the cecum to the hepatic flexure could be infectious or inflammatory.  -Continue IV Flagyl and ciprofloxacin, continue antiemetics for nasuea -Continue full liquid diet  -Will give her antiemetics as needed for nausea.  -GI pathogen panel pending -Gastroenterology consulted and may opt to do colonoscopy while inpatient. Pending further recommendations  Tobacco abuse  -Will place  patient on nicotine patch.  -Smoking cessation counseling given.   COPD  -Currently compensated. Continue albuterol as needed.   Arthritis  -Will hold meloxicam at this time. Will continue her on oxycodone/acetaminophen and morphine PRN.   Code Status: Full  Family Communication: None at bedside  Disposition Plan: Admitted  Time Spent in minutes   30 minutes  Procedures  None  Consults   Gastroenterology  DVT Prophylaxis  SCDs  Lab Results  Component Value Date   PLT 222 08/05/2013    Medications  Scheduled Meds: . ciprofloxacin  400 mg Intravenous Q12H  . dicyclomine  20 mg Oral TID AC & HS  . gabapentin  300 mg Oral BID  . metronidazole  500 mg Intravenous Q8H  . nicotine  21 mg Transdermal Daily  . QUEtiapine  300 mg Oral QHS  . sodium chloride  3 mL Intravenous Q12H   Continuous Infusions:  PRN Meds:.sodium chloride, acetaminophen, acetaminophen, albuterol, cyclobenzaprine, morphine injection, ondansetron (ZOFRAN) IV, ondansetron, oxyCODONE-acetaminophen, sodium chloride  Antibiotics    Anti-infectives   Start     Dose/Rate Route Frequency Ordered Stop   08/04/13 1600  ciprofloxacin (CIPRO) IVPB 400 mg  400 mg 200 mL/hr over 60 Minutes Intravenous Every 12 hours 08/04/13 1443     08/04/13 1600  metroNIDAZOLE (FLAGYL) IVPB 500 mg     500 mg 100 mL/hr over 60 Minutes Intravenous Every 8 hours 08/04/13 1443     08/04/13 1315  ciprofloxacin (CIPRO) IVPB 400 mg     400 mg 200 mL/hr over 60 Minutes Intravenous  Once 08/04/13 1312 08/04/13 1445   08/04/13 1315  metroNIDAZOLE (FLAGYL) IVPB 500 mg     500 mg 100 mL/hr over 60 Minutes Intravenous  Once 08/04/13 1312 08/04/13 1437        Subjective:   [name] seen and examined today. Patient states she's feeling much better today. She states that her nausea and vomiting have also improved. Patient states she has not had another bowel movement since being admitted. She currently denies any chest  pain, shortness of breath, dizziness, headache.  Objective:   Filed Vitals:   08/04/13 1425 08/04/13 1531 08/04/13 2223 08/05/13 0501  BP: 133/86 116/70 113/67 103/62  Pulse: 55 74 71 49  Temp:  98.8 F (37.1 C) 98.1 F (36.7 C) 98.3 F (36.8 C)  TempSrc:  Oral Oral Oral  Resp: 22 22 17 24   Height:      Weight:      SpO2: 100% 97% 96% 98%    Wt Readings from Last 3 Encounters:  08/04/13 63.05 kg (139 lb)  08/02/10 68.04 kg (150 lb)     Intake/Output Summary (Last 24 hours) at 08/05/13 1417 Last data filed at 08/04/13 2354  Gross per 24 hour  Intake    660 ml  Output    350 ml  Net    310 ml    Exam General: Well developed, well nourished, NAD, appears stated age  HEENT: NCAT, mucous membranes moist. Poor dentition.   Cardiovascular: S1 S2 auscultated, no rubs, murmurs or gallops. Regular rate and rhythm.  Respiratory: Clear to auscultation bilaterally with equal chest rise  Abdomen: Soft, LLQ tenderness , nondistended, + bowel sounds, no rebound or guarding  Extremities: warm dry without cyanosis clubbing or edema  Neuro: AAOx3, no focal deficits Skin: Without rashes exudates or nodules  Psych: Normal affect and demeanor with intact judgement and insight  Data Review   Micro Results No results found for this or any previous visit (from the past 240 hour(s)).  Radiology Reports Ct Abdomen Pelvis W Contrast  08/04/2013   CLINICAL DATA:  Left lower quadrant pain and diarrhea. Possible diverticulitis.  EXAM: CT ABDOMEN AND PELVIS WITH CONTRAST  TECHNIQUE: Multidetector CT imaging of the abdomen and pelvis was performed using the standard protocol following bolus administration of intravenous contrast.  CONTRAST:  08/06/2013 OMNIPAQUE IOHEXOL 300 MG/ML  SOLN  COMPARISON:  Ultrasound 05/20/2013  FINDINGS: Lung bases are clear except for a few linear scars. No pleural or pericardial fluid.  The liver has a normal appearance. No calcified gallstones. The spleen is normal. The  pancreas is normal. The adrenal glands are normal. The left kidney is normal. The right kidney shows a 7 mm cyst at the upper pole and a 3.6 cm cyst at the lower pole. The aorta and IVC are unremarkable. No retroperitoneal mass or lymphadenopathy. No free intraperitoneal fluid or air. There is abnormal edema affecting the colon from the cecum to the splenic flexure. The left colon appears normal. The small bowel appears normal. 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 No adnexal mass. There are ordinary degenerative changes of  the lumbar spine.  IMPRESSION: Colitis affecting the colon from the cecum to the hepatic flexure. This could be due to infectious or inflammatory colitis. The possibility of ischemic colitis is less likely. The left colon appears largely spared.   Electronically Signed   By: [name] M.D.   On: 08/04/2013 12:44    CBC  Recent Labs Lab 08/04/13 0930 08/05/13 0700  WBC 3.8* 2.8*  HGB 15.4* 12.9  HCT 46.6* 40.6  PLT 257 222  MCV 84.3 84.9  MCH 27.8 27.0  MCHC 33.0 31.8  RDW 13.1 13.4  LYMPHSABS 1.4  --   MONOABS 0.4  --   EOSABS 0.1  --   BASOSABS 0.0  --     Chemistries   Recent Labs Lab 08/04/13 0930 08/05/13 0700  NA 146 142  K 5.4* 4.0  CL 106 107  CO2 29 25  GLUCOSE 95 92  BUN 11 7  CREATININE 0.86 0.86  CALCIUM 9.8 8.6  AST 19  --   ALT 18  --   ALKPHOS 65  --   BILITOT 0.4  --    ------------------------------------------------------------------------------------------------------------------ estimated creatinine clearance is 61.9 ml/min (by C-G formula based on Cr of 0.86). ------------------------------------------------------------------------------------------------------------------ No results found for this basename: HGBA1C,  in the last 72 hours ------------------------------------------------------------------------------------------------------------------ No results found for this basename: CHOL, HDL, LDLCALC, TRIG,  CHOLHDL, LDLDIRECT,  in the last 72 hours ------------------------------------------------------------------------------------------------------------------ No results found for this basename: TSH, T4TOTAL, FREET3, T3FREE, THYROIDAB,  in the last 72 hours ------------------------------------------------------------------------------------------------------------------ No results found for this basename: VITAMINB12, FOLATE, FERRITIN, TIBC, IRON, RETICCTPCT,  in the last 72 hours  Coagulation profile No results found for this basename: INR, PROTIME,  in the last 168 hours  No results found for this basename: DDIMER,  in the last 72 hours  Cardiac Enzymes No results found for this basename: CK, CKMB, TROPONINI, MYOGLOBIN,  in the last 168 hours ------------------------------------------------------------------------------------------------------------------ No components found with this basename: POCBNP,     Mishael Haran D.O. on 08/05/2013 at 2:17 PM  Between 7am to 7pm - Pager - [phone]  After 7pm go to [email or web address] - password [email or web address]  And look for the night coverage person covering for me after hours  [facility] Office  [phone]

## 2013-08-05 NOTE — Progress Notes (Signed)
    Progress Note   Subjective  No further Nausea, Vomiting, Diarrhea, or Bleeding since yesterday afternoon. Pain greatly decreased. Tolerating liquid diet well.  + flatus. States that if she needs a colonoscopy she wants it done inpatient, because she "knows herself" and states " I wont come to anything once I get out of the hospital."   Explained to patient the importance of outpatient follow up and management, but she was still unwilling to follow up outside of the hospital.   Objective   Vital signs in last 24 hours: Temp:  [97.7 F (36.5 C)-98.8 F (37.1 C)] 98.3 F (36.8 C) (07/30 0501) Pulse Rate:  [46-74] 49 (07/30 0501) Resp:  [16-24] 24 (07/30 0501) BP: (103-157)/(59-95) 103/62 mmHg (07/30 0501) SpO2:  [96 %-100 %] 98 % (07/30 0501) Weight:  [139 lb (63.05 kg)] 139 lb (63.05 kg) (07/29 0935) Last BM Date: 08/04/13 General:   Thin, AA female in NAD Heart:  Normal S1 S2, No MGR noted. Lungs: CTA bilatteral.  Unlabored and even. Abdomen:  Still mildly distended. Less tender than yesterday, but still some in epigastric. Soft. Normal bowel sounds. Extremities:  Without edema.  2+ DP and radial pulses. Neurologic:  Alert and oriented,  grossly normal neurologically. Psych:  Cooperative. Normal mood and affect.  Lab Results:  Recent Labs  08/04/13 0930 08/05/13 0700  WBC 3.8* 2.8*  HGB 15.4* 12.9  HCT 46.6* 40.6  PLT 257 222       Assessment / Plan:    - Diarrhea - No further events since moving up to floor. GI pathogen panel pending. Currently on Cipro/Flagyl. Continue to monitor hydration status. - BRBPR - Chronic, noted. Likely hemorrhoidal.  Will follow and may need outpatient treatment.   - Nausea with vomiting - No further since yesterday. Antiemetics as needed.  Dispo : Home 1-2 days with continued improvement.  Will talk to MD about Colonoscopy as pt states she will likely not comply with outpatient appointment.   LOS: 1 day   Doreatha Martin   08/05/2013, 8:27 AM   I have personally seen the patient, reviewed and repeated key elements of the history and physical and participated in formation of the assessment and plan the student has documented.  She is improved and has either had ischemic or infectious colitis - favor latter.  I have explained she is medically ready to dc tomorrow if able and that cannot do her elective colonoscopy until Sat. She has decided ok to go home and have an outpatient colonoscopy.  We will get one arranged with direct procedure scheduling through my office.   Iva Boop, MD, [facility] [phone] (pager) 08/05/2013 5:32 PM

## 2013-08-06 ENCOUNTER — Encounter: Payer: Self-pay | Admitting: Internal Medicine

## 2013-08-06 DIAGNOSIS — R112 Nausea with vomiting, unspecified: Secondary | ICD-10-CM

## 2013-08-06 DIAGNOSIS — R197 Diarrhea, unspecified: Secondary | ICD-10-CM

## 2013-08-06 DIAGNOSIS — F17200 Nicotine dependence, unspecified, uncomplicated: Secondary | ICD-10-CM

## 2013-08-06 DIAGNOSIS — R109 Unspecified abdominal pain: Secondary | ICD-10-CM

## 2013-08-06 LAB — BASIC METABOLIC PANEL
Anion gap: 10 (ref 5–15)
BUN: 8 mg/dL (ref 6–23)
CO2: 29 mEq/L (ref 19–32)
Calcium: 8.9 mg/dL (ref 8.4–10.5)
Chloride: 105 mEq/L (ref 96–112)
Creatinine, Ser: 0.93 mg/dL (ref 0.50–1.10)
GFR, EST AFRICAN AMERICAN: 74 mL/min — AB (ref >90)
GFR, EST NON AFRICAN AMERICAN: 64 mL/min — AB (ref >90)
Glucose, Bld: 92 mg/dL (ref 70–99)
POTASSIUM: 4.5 meq/L (ref 3.7–5.3)
Sodium: 144 mEq/L (ref 137–147)

## 2013-08-06 LAB — CBC
HEMATOCRIT: 43.8 % (ref 36.0–46.0)
Hemoglobin: 13.9 g/dL (ref 12.0–15.0)
MCH: 26.8 pg (ref 26.0–34.0)
MCHC: 31.7 g/dL (ref 30.0–36.0)
MCV: 84.6 fL (ref 78.0–100.0)
PLATELETS: 212 10*3/uL (ref 150–400)
RBC: 5.18 MIL/uL — ABNORMAL HIGH (ref 3.87–5.11)
RDW: 13.1 % (ref 11.5–15.5)
WBC: 2.7 10*3/uL — ABNORMAL LOW (ref 4.0–10.5)

## 2013-08-06 LAB — CLOSTRIDIUM DIFFICILE BY PCR: Toxigenic C. Difficile by PCR: NEGATIVE

## 2013-08-06 MED ORDER — SODIUM CHLORIDE 0.9 % IV SOLN
INTRAVENOUS | Status: DC
  Administered 2013-08-07: 08:00:00 via INTRAVENOUS

## 2013-08-06 MED ORDER — DICYCLOMINE HCL 20 MG PO TABS
20.0000 mg | ORAL_TABLET | Freq: Three times a day (TID) | ORAL | Status: DC
Start: 2013-08-06 — End: 2016-01-03

## 2013-08-06 MED ORDER — METOCLOPRAMIDE HCL 5 MG/ML IJ SOLN
10.0000 mg | Freq: Once | INTRAMUSCULAR | Status: AC
  Administered 2013-08-06: 10 mg via INTRAVENOUS
  Filled 2013-08-06: qty 2

## 2013-08-06 MED ORDER — CIPROFLOXACIN HCL 500 MG PO TABS: 500.0000 mg | ORAL_TABLET | Freq: Two times a day (BID) | ORAL | Status: DC

## 2013-08-06 MED ORDER — PEG-KCL-NACL-NASULF-NA ASC-C 100 G PO SOLR
0.5000 | Freq: Once | ORAL | Status: AC
  Administered 2013-08-06: 100 g via ORAL
  Filled 2013-08-06: qty 1

## 2013-08-06 MED ORDER — BOOST / RESOURCE BREEZE PO LIQD
1.0000 | Freq: Three times a day (TID) | ORAL | Status: DC
  Administered 2013-08-06 – 2013-08-07 (×2): 1 via ORAL

## 2013-08-06 MED ORDER — METRONIDAZOLE 500 MG PO TABS: 500.0000 mg | ORAL_TABLET | Freq: Three times a day (TID) | ORAL | Status: DC

## 2013-08-06 MED ORDER — OXYCODONE-ACETAMINOPHEN 5-325 MG PO TABS: 1.0000 | ORAL_TABLET | Freq: Three times a day (TID) | ORAL | Status: DC | PRN

## 2013-08-06 MED ORDER — NICOTINE 21 MG/24HR TD PT24: 21.0000 mg | MEDICATED_PATCH | Freq: Every day | TRANSDERMAL | Status: DC

## 2013-08-06 MED ORDER — PEG-KCL-NACL-NASULF-NA ASC-C 100 G PO SOLR
0.5000 | Freq: Once | ORAL | Status: AC
  Administered 2013-08-07: 100 g via ORAL
  Filled 2013-08-06: qty 1

## 2013-08-06 MED ORDER — BISACODYL 5 MG PO TBEC
5.0000 mg | DELAYED_RELEASE_TABLET | Freq: Two times a day (BID) | ORAL | Status: AC
  Administered 2013-08-06: 5 mg via ORAL
  Filled 2013-08-06: qty 1

## 2013-08-06 MED ORDER — METOCLOPRAMIDE HCL 5 MG/ML IJ SOLN
10.0000 mg | Freq: Once | INTRAMUSCULAR | Status: AC
  Administered 2013-08-07: 10 mg via INTRAVENOUS
  Filled 2013-08-06: qty 2

## 2013-08-06 MED ORDER — PEG-KCL-NACL-NASULF-NA ASC-C 100 G PO SOLR: 1.0000 | Freq: Once | ORAL | Status: DC

## 2013-08-06 NOTE — Progress Notes (Signed)
INITIAL NUTRITION ASSESSMENT  DOCUMENTATION CODES Per approved criteria  -Not Applicable   INTERVENTION: Resource Breeze po TID, each supplement provides 250 kcal and 9 grams of protein  NUTRITION DIAGNOSIS: Inadequate oral intake related to abdominal pain/nausea/vomiting as evidenced by clear liquid diet and wt loss.   Goal: Pt to meet >/= 90% of their estimated nutrition needs   Monitor:  Weight trends, po intake, labs  Reason for Assessment: MST  63 y.o. female  Admitting Dx: Colitis  ASSESSMENT: 63 y.o. female with a history of asthma and COPD, family history of colon cancer that presents to the emergency department with abdominal pain, nausea, vomiting, diarrhea bright red blood per rectum.   - Pt scheduled for colonoscopy tomorrow morning.  - Pt was complaining of being "starving" on a clear liquid diet. RD explained the importance of diet for procedure tomorrow.  - Pt has lost ~7 lbs recently. Will be sent nutritional supplements appropriate for clear liquid diet to avoid further weight loss. - Pt with no signs of significant fat and/or muscle wasting  Height: Ht Readings from Last 1 Encounters:  08/04/13 _0  (1.676 m)    Weight: Wt Readings from Last 1 Encounters:  08/04/13 139 lb (63.05 kg)    Ideal Body Weight: 61.3 kg  % Ideal Body Weight: 103%  Wt Readings from Last 10 Encounters:  08/04/13 139 lb (63.05 kg)  08/02/10 150 lb (68.04 kg)    Usual Body Weight: 146 lbs  % Usual Body Weight: 95%  BMI:  Body mass index is 22.45 kg/(m^2).  Estimated Nutritional Needs: Kcal: 1900-2100 Protein: 85-95 g Fluid: 1.9-2.1 L/day  Skin: Intact  Diet Order: Clear Liquid  EDUCATION NEEDS: -Education needs addressed   Intake/Output Summary (Last 24 hours) at 08/06/13 1134 Last data filed at 08/06/13 1039  Gross per 24 hour  Intake    400 ml  Output      0 ml  Net    400 ml    Last BM: prior to admission   Labs:   Recent Labs Lab  08/04/13 0930 08/05/13 0700 08/06/13 0842  NA 146 142 144  K 5.4* 4.0 4.5  CL 106 107 105  CO2 _1 BUN _2 CREATININE 0.86 0.86 0.93  CALCIUM 9.8 8.6 8.9  GLUCOSE 95 92 92    CBG (last 3)  No results found for this basename: GLUCAP,  in the last 72 hours  Scheduled Meds: . bisacodyl  5 mg Oral BID  . ciprofloxacin  400 mg Intravenous Q12H  . dicyclomine  20 mg Oral TID AC & HS  . gabapentin  300 mg Oral BID  . metoCLOPramide (REGLAN) injection  10 mg Intravenous Once   And  . [START ON 08/07/2013] metoCLOPramide (REGLAN) injection  10 mg Intravenous Once  . metronidazole  500 mg Intravenous Q8H  . nicotine  21 mg Transdermal Daily  . peg 3350 powder  0.5 kit Oral Once  . [START ON 08/07/2013] peg 3350 powder  0.5 kit Oral Once  . QUEtiapine  300 mg Oral QHS  . sodium chloride  3 mL Intravenous Q12H    Continuous Infusions:   Past Medical History  Diagnosis Date  . Emphysema   . Asthma   . Polysubstance abuse 2011    4 day [facility] admission for this. Cocaine, ETOH, MJA,   . Anxiety 2011  . Hemorrhoids   . Arthritis     Past Surgical History  Procedure  Laterality Date  . Incision and drainage  12/2009    of thrombosed internal hemorrhoid    Terrace Arabia RD, LDN

## 2013-08-06 NOTE — Progress Notes (Signed)
Progress Note   Subjective   Several normal BMs yesterday.  States her pain and bloating have returned this morning, almost as bad as when she was admitted.  No further N/V. States she doesn't like the food.   Objective   Vital signs in last 24 hours: Temp:  [98.3 F (36.8 C)-98.5 F (36.9 C)] 98.3 F (36.8 C) (07/31 0553) Pulse Rate:  [50-65] 50 (07/31 0553) Resp:  [16-20] 16 (07/31 0553) BP: (101-112)/(61-71) 105/71 mmHg (07/31 0553) SpO2:  [95 %-99 %] 99 % (07/31 0553) Last BM Date: 08/06/13 General: NAD Heart:  RRR, No MGR noted. Lungs: CTA bilatteral.  Unlabored and even. Abdomen:  Moderately distended.  Diffusely tender worse in LLQ. Normal bowel sounds. Extremities:  Without edema.  2+ DP and radial pulses. Neurologic:  Alert and oriented,  grossly normal neurologically. Psych:  Cooperative. Normal mood and affect.  Intake/Output from previous day:   Intake/Output this shift:    Lab Results:  Recent Labs  08/04/13 0930 08/05/13 0700 08/06/13 0842  WBC 3.8* 2.8* 2.7*  HGB 15.4* 12.9 13.9  HCT 46.6* 40.6 43.8  PLT 257 222 212   BMET  Recent Labs  08/04/13 0930 08/05/13 0700 08/06/13 0842  NA 146 142 144  K 5.4* 4.0 4.5  CL 106 107 105  CO2 29 25 29   GLUCOSE 95 92 92  BUN 11 7 8   CREATININE 0.86 0.86 0.93  CALCIUM 9.8 8.6 8.9   LFT  Recent Labs  08/04/13 0930  PROT 7.2  ALBUMIN 4.1  AST 19  ALT 18  ALKPHOS 65  BILITOT 0.4   PT/INR No results found for this basename: LABPROT, INR,  in the last 72 hours  Studies/Results: Ct Abdomen Pelvis W Contrast  08/04/2013   CLINICAL DATA:  Left lower quadrant pain and diarrhea. Possible diverticulitis.  EXAM: CT ABDOMEN AND PELVIS WITH CONTRAST  TECHNIQUE: Multidetector CT imaging of the abdomen and pelvis was performed using the standard protocol following bolus administration of intravenous contrast.  CONTRAST:  08/06/13 OMNIPAQUE IOHEXOL 300 MG/ML  SOLN  COMPARISON:  Ultrasound 05/20/2013   FINDINGS: Lung bases are clear except for a few linear scars. No pleural or pericardial fluid.  The liver has a normal appearance. No calcified gallstones. The spleen is normal. The pancreas is normal. The adrenal glands are normal. The left kidney is normal. The right kidney shows a 7 mm cyst at the upper pole and a 3.6 cm cyst at the lower pole. The aorta and IVC are unremarkable. No retroperitoneal mass or lymphadenopathy. No free intraperitoneal fluid or air. There is abnormal edema affecting the colon from the cecum to the splenic flexure. The left colon appears normal. The small bowel appears normal. 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 No adnexal mass. There are ordinary degenerative changes of the lumbar spine.  IMPRESSION: Colitis affecting the colon from the cecum to the hepatic flexure. This could be due to infectious or inflammatory colitis. The possibility of ischemic colitis is less likely. The left colon appears largely spared.   Electronically Signed   By: M.D.   On: 08/04/2013 12:44       Assessment / Plan:    - Diarrhea - No further events since moving up to floor. GI pathogen panel pending. Currently on Cipro/Flagyl. Continue to monitor hydration status.  - BRBPR - Chronic, noted. Likely hemorrhoidal.  - Nausea with vomiting - No further since 08/04/13. Antiemetics as needed.  With return of pain and pts unwillingness to wait for outpatient colonoscopy, will be scheduled for 0800 tomorrow.    LOS: 2 days   Thereasa Distance, [place] Student  08/06/2013, 9:38 AM   I have personally seen the patient, reviewed and repeated key elements of the history and physical and participated in formation of the assessment and plan the student has documented.  The risks and benefits as well as alternatives of endoscopic procedure(s) have been discussed and reviewed. All questions answered. The patient agrees to proceed.

## 2013-08-06 NOTE — Progress Notes (Signed)
[facility]                                                                              Patient Demographics  Dawn Lane, is a 63 y.o. female, DOB - [birth year], UDJ:[id number]  Admit date - 08/04/2013   Admitting Physician Edsel Petrin, DO  Outpatient Primary MD for the patient is Default, Provider, MD  LOS - 2   Chief Complaint  Patient presents with  . Emesis  . Diarrhea      HPI on 08/04/2013:  Dawn Lane is a 63 y.o. female with a history of asthma and COPD, family history of colon cancer that presents to the emergency department with abdominal pain, nausea, vomiting, diarrhea bright red blood per rectum. Patient stated she has had bright red blood per rectum for approximately 8 months. Patient stated she has not seen any physician for this. She has never had a colonoscopy, although she does have a history of colon cancer. She started having nausea, vomiting, diarrhea, abdominal pain that started the evening prior to admission. She thought it was food poisoning, however, her abdominal pain has not improved. She has been unable to keep any food down. She stated her vomiting felt like poison. She denied any blood in her vomit. She denied any sick contacts, recent illness, fever, chills. She stated she has a crampy type of pain which is located moreso on the left side of her abdomen. Nothing seemed to make the pain better or worse.    Assessment & Plan  Abdominal pain with nausea and vomiting secondary to Colitis  -Improving -CT of the abdomen/pelvis shows colitis affecting the colon from the cecum to the hepatic flexure could be infectious or inflammatory.  -Continue IV Flagyl and ciprofloxacin, continue antiemetics for nasuea -Continue liquid diet  -GI pathogen panel pending -Gastroenterology consulted and agrees with agreed with antibiotics for infectious colitis vs ischemic colitis, and patient was supposed to have outpatient colonoscopy, however that has been changed  to 08/07/2013  Tobacco abuse  -Continue nicotine patch.  -Smoking cessation counseling given.   COPD  -Currently compensated. Continue albuterol as needed.   Arthritis  -Will hold meloxicam at this time. Will continue her on oxycodone/acetaminophen and morphine PRN.   Code Status: Full  Family Communication: None at bedside  Disposition Plan: Admitted  Time Spent in minutes   25 minutes  Procedures  Colonoscopy 08/07/2013  Consults   Gastroenterology  DVT Prophylaxis  SCDs  Lab Results  Component Value Date   PLT 212 08/06/2013    Medications  Scheduled Meds: . ciprofloxacin  400 mg Intravenous Q12H  . dicyclomine  20 mg Oral TID AC & HS  . gabapentin  300 mg Oral BID  . metronidazole  500 mg Intravenous Q8H  . nicotine  21 mg Transdermal Daily  . QUEtiapine  300 mg Oral QHS  . sodium chloride  3 mL Intravenous Q12H   Continuous Infusions:  PRN Meds:.sodium chloride, acetaminophen, acetaminophen, albuterol, cyclobenzaprine, morphine injection, ondansetron (ZOFRAN) IV, ondansetron, oxyCODONE-acetaminophen, sodium chloride  Antibiotics    Anti-infectives   Start     Dose/Rate Route Frequency Ordered Stop   08/06/13 0000  ciprofloxacin (CIPRO) 500 MG  tablet     500 mg Oral 2 times daily 08/06/13 0734     08/06/13 0000  metroNIDAZOLE (FLAGYL) 500 MG tablet     500 mg Oral 3 times daily 08/06/13 0734     08/04/13 1600  ciprofloxacin (CIPRO) IVPB 400 mg     400 mg 200 mL/hr over 60 Minutes Intravenous Every 12 hours 08/04/13 1443     08/04/13 1600  metroNIDAZOLE (FLAGYL) IVPB 500 mg     500 mg 100 mL/hr over 60 Minutes Intravenous Every 8 hours 08/04/13 1443     08/04/13 1315  ciprofloxacin (CIPRO) IVPB 400 mg     400 mg 200 mL/hr over 60 Minutes Intravenous  Once 08/04/13 1312 08/04/13 1445   08/04/13 1315  metroNIDAZOLE (FLAGYL) IVPB 500 mg     500 mg 100 mL/hr over 60 Minutes Intravenous  Once 08/04/13 1312 08/04/13 1437        Subjective:   Dawn Lane seen and examined today. Patient states she would like to go home and  [employer] salad and fried tomatoes.  She denies any diarrhea, nausea or vomiting.   Objective:   Filed Vitals:   08/05/13 0501 08/05/13 1500 08/05/13 2033 08/06/13 0553  BP: 103/62 112/64 101/61 105/71  Pulse: 49 56 65 50  Temp: 98.3 F (36.8 C) 98.5 F (36.9 C) 98.3 F (36.8 C) 98.3 F (36.8 C)  TempSrc: Oral Oral Oral Oral  Resp: 24 20 18 16   Height:      Weight:      SpO2: 98% 95% 97% 99%    Wt Readings from Last 3 Encounters:  08/04/13 63.05 kg (139 lb)  08/02/10 68.04 kg (150 lb)     Intake/Output Summary (Last 24 hours) at 08/06/13 1040 Last data filed at 08/06/13 1039  Gross per 24 hour  Intake    400 ml  Output      0 ml  Net    400 ml    Exam General: Well developed, well nourished, NAD, appears stated age  HEENT: NCAT, mucous membranes moist. Poor dentition.   Cardiovascular: S1 S2 auscultated, . Regular rate and rhythm.  Respiratory: Clear to auscultation bilaterally Abdomen: Soft, nontender, nondistended, + bowel sounds, no rebound or guarding  Extremities: warm dry without cyanosis clubbing or edema  Neuro: AAOx3, no focal deficits Skin: Without rashes exudates or nodules  Psych: Normal affect and demeanor   Data Review   Micro Results No results found for this or any previous visit (from the past 240 hour(s)).  Radiology Reports Ct Abdomen Pelvis W Contrast  08/04/2013   CLINICAL DATA:  Left lower quadrant pain and diarrhea. Possible diverticulitis.  EXAM: CT ABDOMEN AND PELVIS WITH CONTRAST  TECHNIQUE: Multidetector CT imaging of the abdomen and pelvis was performed using the standard protocol following bolus administration of intravenous contrast.  CONTRAST:  08/06/2013 OMNIPAQUE IOHEXOL 300 MG/ML  SOLN  COMPARISON:  Ultrasound 05/20/2013  FINDINGS: Lung bases are clear except for a few linear scars. No pleural or pericardial fluid.  The liver has a normal appearance. No  calcified gallstones. The spleen is normal. The pancreas is normal. The adrenal glands are normal. The left kidney is normal. The right kidney shows a 7 mm cyst at the upper pole and a 3.6 cm cyst at the lower pole. The aorta and IVC are unremarkable. No retroperitoneal mass or lymphadenopathy. No free intraperitoneal fluid or air. There is abnormal edema affecting the colon from the cecum to the splenic  flexure. The left colon appears normal. The small bowel appears normal. 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 No adnexal mass. There are ordinary degenerative changes of the lumbar spine.  IMPRESSION: Colitis affecting the colon from the cecum to the hepatic flexure. This could be due to infectious or inflammatory colitis. The possibility of ischemic colitis is less likely. The left colon appears largely spared.   Electronically Signed   By: [name] M.D.   On: 08/04/2013 12:44    CBC  Recent Labs Lab 08/04/13 0930 08/05/13 0700 08/06/13 0842  WBC 3.8* 2.8* 2.7*  HGB 15.4* 12.9 13.9  HCT 46.6* 40.6 43.8  PLT 257 222 212  MCV 84.3 84.9 84.6  MCH 27.8 27.0 26.8  MCHC 33.0 31.8 31.7  RDW 13.1 13.4 13.1  LYMPHSABS 1.4  --   --   MONOABS 0.4  --   --   EOSABS 0.1  --   --   BASOSABS 0.0  --   --     Chemistries   Recent Labs Lab 08/04/13 0930 08/05/13 0700 08/06/13 0842  NA 146 142 144  K 5.4* 4.0 4.5  CL 106 107 105  CO2 29 25 29   GLUCOSE 95 92 92  BUN 11 7 8   CREATININE 0.86 0.86 0.93  CALCIUM 9.8 8.6 8.9  AST 19  --   --   ALT 18  --   --   ALKPHOS 65  --   --   BILITOT 0.4  --   --    ------------------------------------------------------------------------------------------------------------------ estimated creatinine clearance is 57.2 ml/min (by C-G formula based on Cr of 0.93). ------------------------------------------------------------------------------------------------------------------ No results found for this basename: HGBA1C,  in the last 72  hours ------------------------------------------------------------------------------------------------------------------ No results found for this basename: CHOL, HDL, LDLCALC, TRIG, CHOLHDL, LDLDIRECT,  in the last 72 hours ------------------------------------------------------------------------------------------------------------------ No results found for this basename: TSH, T4TOTAL, FREET3, T3FREE, THYROIDAB,  in the last 72 hours ------------------------------------------------------------------------------------------------------------------ No results found for this basename: VITAMINB12, FOLATE, FERRITIN, TIBC, IRON, RETICCTPCT,  in the last 72 hours  Coagulation profile No results found for this basename: INR, PROTIME,  in the last 168 hours  No results found for this basename: DDIMER,  in the last 72 hours  Cardiac Enzymes No results found for this basename: CK, CKMB, TROPONINI, MYOGLOBIN,  in the last 168 hours ------------------------------------------------------------------------------------------------------------------ No components found with this basename: POCBNP,     Brendy Ficek D.O. on 08/06/2013 at 10:40 AM  Between 7am to 7pm - Pager - [phone]  After 7pm go to [email or web address] - password [email or web address]  And look for the night coverage person covering for me after hours  [facility] Office  [phone]

## 2013-08-06 NOTE — Discharge Summary (Addendum)
Physician Discharge Summary  Dawn Lane HQP:[id number] DOB: 09/23/1950 DOA: 08/04/2013  PCP: Default, Provider, MD  Admit date: 08/04/2013 Discharge date: 08/07/2013  Time spent: 45 minutes  Recommendations for Outpatient Follow-up:  Patient will be discharged to home. She is to followup with her primary care doctor within one week of discharge.  Patient will need to followup for repeat colonoscopy in 10 years. Patient is to continue her medications as prescribed. Patient is to continue physical activity as tolerated. Patient should also followup regarding her renal cyst. Patient was strongly urged to be compliant with her appointments.  Discharge Diagnoses:  Abdominal pain with nausea and vomiting secondary to gastroenteritis versus colitis Tobacco abuse COPD Arthritis  Discharge Condition: Stable  Diet recommendation: Soft bland diet  Filed Weights   08/04/13 0935  Weight: 63.05 kg (139 lb)    History of present illness:  on 08/04/2013:  Dawn Lane is a 63 y.o. female with a history of asthma and COPD, family history of colon cancer that presents to the emergency department with abdominal pain, nausea, vomiting, diarrhea bright red blood per rectum. Patient stated she has had bright red blood per rectum for approximately 8 months. Patient stated she has not seen any physician for this. She has never had a colonoscopy, although she does have a history of colon cancer. She started having nausea, vomiting, diarrhea, abdominal pain that started the evening prior to admission. She thought it was food poisoning, however, her abdominal pain has not improved. She has been unable to keep any food down. She stated her vomiting felt like poison. She denied any blood in her vomit. She denied any sick contacts, recent illness, fever, chills. She stated she has a crampy type of pain which is located moreso on the left side of her abdomen. Nothing seemed to make the pain better or worse.    Hospital Course:  Abdominal pain with nausea and vomiting secondary to Colitis versus gastroenteritis -Improving, no longer had abdominal pain, nausea or vomiting.  -CT of the abdomen/pelvis shows colitis affecting the colon from the cecum to the hepatic flexure could be infectious or inflammatory.  -Continue full liquid diet and advance to soft bland diet -Gastroenterology consulted and colonoscopy. -Colonoscopy showed normal colon with internal hemorrhoids, gastroenterology felt that she had gastroenteritis versus colitis -Gastroenterology recommended to start antibiotics, continue suppositories, next colonoscopy in 10 years  Tobacco abuse  -Continue nicotine patch.  -Smoking cessation counseling given.   COPD  -Currently compensated. Continue albuterol as needed.   Arthritis  -Will hold meloxicam at this time.  -Continue her on oxycodone/acetaminophen  Renal cyst -Patient has had multiple appointments for followup regarding her renal cyst however has continued to cancel them. Patient should follow up with her primary care physician.  Procedures: Colonoscopy ENDOSCOPIC IMPRESSION: 1. Normal colon, 2. Internal hemorrhoids RECOMMENDATIONS: Home today, Stop Antibiotiocs, dicyclomine prn for post-infectious IBS Believe she had gastroenteritis and colon findings at CT were spasm and not colitis See PCP, GI appt not needed at this time, hemorrhoid suppositories prn repeat colonoscopy 10 yrs  Consultations: Gastroenterology  Discharge Exam: Filed Vitals:   08/07/13 0830  BP: 125/65  Pulse: 56  Temp:   Resp: 13   Exam  General: Well developed, well nourished, NAD, appears stated age  HEENT: NCAT, mucous membranes moist. Poor dentition.  Cardiovascular: S1 S2 auscultated, regular rate and rhythm.  Respiratory: Clear to auscultation bilaterally with equal chest rise  Abdomen: Soft, nontender , nondistended, + bowel sounds, no rebound or  guarding  Extremities: warm dry  without cyanosis clubbing or edema  Neuro: AAOx3, no focal deficits  Psych: Normal affect and demeanor    Discharge Instructions      Discharge Instructions   Discharge instructions    Complete by:  As directed   Patient will be discharged to home. She is to followup with her primary care doctor within one week of discharge.  Patient will need to followup for repeat colonoscopy in 10 years. Patient is to continue her medications as prescribed. Patient is to continue physical activity as tolerated. Patient should also followup regarding her renal cyst. Patient was strongly urged to be compliant with her appointments.            Medication List    STOP taking these medications       meloxicam 7.5 MG tablet  Commonly known as:  MOBIC      TAKE these medications       albuterol 108 (90 BASE) MCG/ACT inhaler  Commonly known as:  PROVENTIL HFA;VENTOLIN HFA  Inhale 2 puffs into the lungs every 6 (six) hours as needed for wheezing.     albuterol (2.5 MG/3ML) 0.083% nebulizer solution  Commonly known as:  PROVENTIL  Take 3 mLs (2.5 mg total) by nebulization every 6 (six) hours as needed for wheezing.     bisacodyl 5 MG EC tablet  Commonly known as:  DULCOLAX  Take 1 tablet (5 mg total) by mouth 2 (two) times daily.     cyclobenzaprine 10 MG tablet  Commonly known as:  FLEXERIL  Take 10 mg by mouth 3 (three) times daily as needed for muscle spasms.     dicyclomine 20 MG tablet  Commonly known as:  BENTYL  Take 1 tablet (20 mg total) by mouth 4 (four) times daily -  before meals and at bedtime.     feeding supplement (RESOURCE BREEZE) Liqd  Take 1 Container by mouth 3 (three) times daily between meals.     gabapentin 300 MG capsule  Commonly known as:  NEURONTIN  Take 300 mg by mouth 2 (two) times daily.     nicotine 21 mg/24hr patch  Commonly known as:  NICODERM CQ - dosed in mg/24 hours  Place 1 patch (21 mg total) onto the skin daily.     oxyCODONE-acetaminophen 5-325  MG per tablet  Commonly known as:  PERCOCET/ROXICET  Take 1 tablet by mouth 3 (three) times daily as needed for severe pain.     QUEtiapine 300 MG tablet  Commonly known as:  SEROQUEL  Take 300 mg by mouth at bedtime.       Allergies  Allergen Reactions  . Lorazepam Other (See Comments)    Cardiac arrest oral Ativan possibly taken from a friend  . Advil [Ibuprofen]   . Vicodin [Hydrocodone-Acetaminophen] Swelling    Swelling to feet   Follow-up Information   Follow up with Dr. Eula Listen. Schedule an appointment as soon as possible for a visit in 1 week. [facility] follow, renal cyst)        The results of significant diagnostics from this hospitalization (including imaging, microbiology, ancillary and laboratory) are listed below for reference.    Significant Diagnostic Studies: Ct Abdomen Pelvis W Contrast  08/04/2013   CLINICAL DATA:  Left lower quadrant pain and diarrhea. Possible diverticulitis.  EXAM: CT ABDOMEN AND PELVIS WITH CONTRAST  TECHNIQUE: Multidetector CT imaging of the abdomen and pelvis was performed using the standard protocol following bolus administration of intravenous  contrast.  CONTRAST:  OMNIPAQUE IOHEXOL 300 MG/ML  SOLN  COMPARISON:  Ultrasound 05/20/2013  FINDINGS: Lung bases are clear except for a few linear scars. No pleural or pericardial fluid.  The liver has a normal appearance. No calcified gallstones. The spleen is normal. The pancreas is normal. The adrenal glands are normal. The left kidney is normal. The right kidney shows a 7 mm cyst at the upper pole and a 3.6 cm cyst at the lower pole. The aorta and IVC are unremarkable. No retroperitoneal mass or lymphadenopathy. No free intraperitoneal fluid or air. There is abnormal edema affecting the colon from the cecum to the splenic flexure. The left colon appears normal. The small bowel appears normal. 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 No adnexal mass. There are ordinary degenerative  changes of the lumbar spine.  IMPRESSION: Colitis affecting the colon from the cecum to the hepatic flexure. This could be due to infectious or inflammatory colitis. The possibility of ischemic colitis is less likely. The left colon appears largely spared.   Electronically Signed   By: [name] M.D.   On: 08/04/2013 12:44    Microbiology: Recent Results (from the past 240 hour(s))  CLOSTRIDIUM DIFFICILE BY PCR     Status: None   Collection Time    08/06/13  9:36 PM      Result Value Ref Range Status   C difficile by pcr NEGATIVE  NEGATIVE Final     Labs: Basic Metabolic Panel:  Recent Labs Lab 08/04/13 0930 08/05/13 0700 08/06/13 0842  NA 146 142 144  K 5.4* 4.0 4.5  CL 106 107 105  CO2 29 25 29   GLUCOSE 95 92 92  BUN 11 7 8   CREATININE 0.86 0.86 0.93  CALCIUM 9.8 8.6 8.9   Liver Function Tests:  Recent Labs Lab 08/04/13 0930  AST 19  ALT 18  ALKPHOS 65  BILITOT 0.4  PROT 7.2  ALBUMIN 4.1    Recent Labs Lab 08/04/13 0930  LIPASE 28   No results found for this basename: AMMONIA,  in the last 168 hours CBC:  Recent Labs Lab 08/04/13 0930 08/05/13 0700 08/06/13 0842  WBC 3.8* 2.8* 2.7*  NEUTROABS 2.0  --   --   HGB 15.4* 12.9 13.9  HCT 46.6* 40.6 43.8  MCV 84.3 84.9 84.6  PLT 257 222 212   Cardiac Enzymes: No results found for this basename: CKTOTAL, CKMB, CKMBINDEX, TROPONINI,  in the last 168 hours BNP: BNP (last 3 results)  Recent Labs  09/14/12 1151  PROBNP 80.2   CBG: No results found for this basename: GLUCAP,  in the last 168 hours     Signed:  Edsel Petrin  [facility] 08/07/2013, 10:20 AM

## 2013-08-07 ENCOUNTER — Encounter (HOSPITAL_COMMUNITY)
Admission: EM | Disposition: A | Discharge status: Home / Self Care | Payer: Self-pay | Source: Home / Self Care | Attending: Internal Medicine

## 2013-08-07 ENCOUNTER — Encounter (HOSPITAL_COMMUNITY): Payer: Self-pay | Admitting: *Deleted

## 2013-08-07 HISTORY — PX: COLONOSCOPY: SHX5424

## 2013-08-07 SURGERY — COLONOSCOPY
Anesthesia: Moderate Sedation

## 2013-08-07 MED ORDER — DIPHENHYDRAMINE HCL 50 MG/ML IJ SOLN
INTRAMUSCULAR | Status: AC
  Filled 2013-08-07: qty 1

## 2013-08-07 MED ORDER — DIPHENHYDRAMINE HCL 50 MG/ML IJ SOLN
INTRAMUSCULAR | Status: DC | PRN
  Administered 2013-08-07: 25 mg via INTRAVENOUS

## 2013-08-07 MED ORDER — FENTANYL CITRATE 0.05 MG/ML IJ SOLN
INTRAMUSCULAR | Status: AC
  Filled 2013-08-07: qty 4

## 2013-08-07 MED ORDER — MIDAZOLAM HCL 5 MG/ML IJ SOLN
INTRAMUSCULAR | Status: AC
  Filled 2013-08-07: qty 3

## 2013-08-07 MED ORDER — MIDAZOLAM HCL 5 MG/5ML IJ SOLN
INTRAMUSCULAR | Status: DC | PRN
  Administered 2013-08-07: 1 mg via INTRAVENOUS
  Administered 2013-08-07 (×2): 2 mg via INTRAVENOUS

## 2013-08-07 MED ORDER — BOOST / RESOURCE BREEZE PO LIQD: 1.0000 | Freq: Three times a day (TID) | ORAL | Status: DC

## 2013-08-07 MED ORDER — BISACODYL 5 MG PO TBEC: 5.0000 mg | DELAYED_RELEASE_TABLET | Freq: Two times a day (BID) | ORAL | Status: DC

## 2013-08-07 MED ORDER — FENTANYL CITRATE 0.05 MG/ML IJ SOLN
INTRAMUSCULAR | Status: DC | PRN
  Administered 2013-08-07 (×2): 25 ug via INTRAVENOUS

## 2013-08-07 NOTE — Op Note (Signed)
[facility] [address] [place], [address]   COLONOSCOPY PROCEDURE REPORT  PATIENT: Dawn Lane, Dawn Lane  MR#: [id number] BIRTHDATE: 1950/12/06 , 63  yrs. old GENDER: Female ENDOSCOPIST: Iva Boop, MD, [facility] PROCEDURE DATE:  08/07/2013 PROCEDURE:   Colonoscopy, diagnostic  ASA CLASS:   Class II INDICATIONS:an abnormal CT.   "colitis" MEDICATIONS: Benadryl 25 mg IV, Versed 5 mg IV, and Fentanyl 50 mcg IV  DESCRIPTION OF PROCEDURE:   After the risks benefits and alternatives of the procedure were thoroughly explained, informed consent was obtained.  A digital rectal exam revealed no abnormalities of the rectum.   The Pentax Adult Colon [id number] endoscope was introduced through the anus and advanced to the cecum, which was identified by both the appendix and ileocecal valve. No adverse events experienced.   The quality of the prep was good, using MoviPrep  The instrument was then slowly withdrawn as the colon was fully examined.      COLON FINDINGS: A normal appearing cecum, ileocecal valve, and appendiceal orifice were identified.  The ascending, hepatic flexure, transverse, splenic flexure, descending, sigmoid colon and rectum appeared unremarkable.  No polyps or cancers were seen. Retroflexed views revealed internal hemorrhoids. The time to cecum=4 minutes 0 seconds.  Withdrawal time=10 minutes 0 seconds. The scope was withdrawn and the procedure completed. COMPLICATIONS: There were no complications.  ENDOSCOPIC IMPRESSION: 1.   Normal colon 2.   Internal hemorrhoids  RECOMMENDATIONS: Home today Stop Antibiotiocs dicyclomine prn for post-infectious IBS Believe she had gastroenteritis and colon findings at CT were spasm and not colitis See PCP GI appt not needed at this time hemorrhoid suppositories prn repeat colonoscopy 10 yrs  eSigned:  Iva Boop, MD, [facility] 08/07/2013 8:36 AM   cc:  the patient

## 2013-08-07 NOTE — Discharge Instructions (Signed)
Smoking Cessation Quitting smoking is important to your health and has many advantages. However, it is not always easy to quit since nicotine is a very addictive drug. Oftentimes, people try 3 times or more before being able to quit. This document explains the best ways for you to prepare to quit smoking. Quitting takes hard work and a lot of effort, but you can do it. ADVANTAGES OF QUITTING SMOKING  You will live longer, feel better, and live better.  Your body will feel the impact of quitting smoking almost immediately.  Within 20 minutes, blood pressure decreases. Your pulse returns to its normal level.  After 8 hours, carbon monoxide levels in the blood return to normal. Your oxygen level increases.  After 24 hours, the chance of having a heart attack starts to decrease. Your breath, hair, and body stop smelling like smoke.  After 48 hours, damaged nerve endings begin to recover. Your sense of taste and smell improve.  After 72 hours, the body is virtually free of nicotine. Your bronchial tubes relax and breathing becomes easier.  After 2 to 12 weeks, lungs can hold more air. Exercise becomes easier and circulation improves.  The risk of having a heart attack, stroke, cancer, or lung disease is greatly reduced.  After 1 year, the risk of coronary heart disease is cut in half.  After 5 years, the risk of stroke falls to the same as a nonsmoker.  After 10 years, the risk of lung cancer is cut in half and the risk of other cancers decreases significantly.  After 15 years, the risk of coronary heart disease drops, usually to the level of a nonsmoker.  If you are pregnant, quitting smoking will improve your chances of having a healthy baby.  The people you live with, especially any children, will be healthier.  You will have extra money to spend on things other than cigarettes. QUESTIONS TO THINK ABOUT BEFORE ATTEMPTING TO QUIT You may want to talk about your answers with your  health care provider.  Why do you want to quit?  If you tried to quit in the past, what helped and what did not?  What will be the most difficult situations for you after you quit? How will you plan to handle them?  Who can help you through the tough times? Your family? Friends? A health care provider?  What pleasures do you get from smoking? What ways can you still get pleasure if you quit? Here are some questions to ask your health care provider:  How can you help me to be successful at quitting?  What medicine do you think would be best for me and how should I take it?  What should I do if I need more help?  What is smoking withdrawal like? How can I get information on withdrawal? GET READY  Set a quit date.  Change your environment by getting rid of all cigarettes, ashtrays, matches, and lighters in your home, car, or work. Do not let people smoke in your home.  Review your past attempts to quit. Think about what worked and what did not. GET SUPPORT AND ENCOURAGEMENT You have a better chance of being successful if you have help. You can get support in many ways.  Tell your family, friends, and coworkers that you are going to quit and need their support. Ask them not to smoke around you.  Get individual, group, or telephone counseling and support. Programs are available at [employer] and health centers. Call  your local health department for information about programs in your area.  Spiritual beliefs and practices may help some smokers quit.  Download a "quit meter" on your computer to keep track of quit statistics, such as how long you have gone without smoking, cigarettes not smoked, and money saved.  Get a self-help book about quitting smoking and staying off tobacco. LEARN NEW SKILLS AND BEHAVIORS  Distract yourself from urges to smoke. Talk to someone, go for a walk, or occupy your time with a task.  Change your normal routine. Take a different route to work.  Drink tea instead of coffee. Eat breakfast in a different place.  Reduce your stress. Take a hot bath, exercise, or read a book.  Plan something enjoyable to do every day. Reward yourself for not smoking.  Explore interactive web-based programs that specialize in helping you quit. GET MEDICINE AND USE IT CORRECTLY Medicines can help you stop smoking and decrease the urge to smoke. Combining medicine with the above behavioral methods and support can greatly increase your chances of successfully quitting smoking.  Nicotine replacement therapy helps deliver nicotine to your body without the negative effects and risks of smoking. Nicotine replacement therapy includes nicotine gum, lozenges, inhalers, nasal sprays, and skin patches. Some may be available over-the-counter and others require a prescription.  Antidepressant medicine helps people abstain from smoking, but how this works is unknown. This medicine is available by prescription.  Nicotinic receptor partial agonist medicine simulates the effect of nicotine in your brain. This medicine is available by prescription. Ask your health care provider for advice about which medicines to use and how to use them based on your health history. Your health care provider will tell you what side effects to look out for if you choose to be on a medicine or therapy. Carefully read the information on the package. Do not use any other product containing nicotine while using a nicotine replacement product.  RELAPSE OR DIFFICULT SITUATIONS Most relapses occur within the first 3 months after quitting. Do not be discouraged if you start smoking again. Remember, most people try several times before finally quitting. You may have symptoms of withdrawal because your body is used to nicotine. You may crave cigarettes, be irritable, feel very hungry, cough often, get headaches, or have difficulty concentrating. The withdrawal symptoms are only temporary. They are strongest  when you first quit, but they will go away within 10-14 days. To reduce the chances of relapse, try to:  Avoid drinking alcohol. Drinking lowers your chances of successfully quitting.  Reduce the amount of caffeine you consume. Once you quit smoking, the amount of caffeine in your body increases and can give you symptoms, such as a rapid heartbeat, sweating, and anxiety.  Avoid smokers because they can make you want to smoke.  Do not let weight gain distract you. Many smokers will gain weight when they quit, usually less than 10 pounds. Eat a healthy diet and stay active. You can always lose the weight gained after you quit.  Find ways to improve your mood other than smoking. FOR MORE INFORMATION  www.smokefree.gov  Document Released: 12/18/2000 Document Revised: 05/10/2013 Document Reviewed: 04/04/2011 [facility] Patient Information 2015 [place], [place]. This information is not intended to replace advice given to you by your health care provider. Make sure you discuss any questions you have with your health care provider. Colitis Colitis is inflammation of the colon. Colitis can be a short-term or long-standing (chronic) illness. Crohn's disease and ulcerative colitis are  2 types of colitis which are chronic. They usually require lifelong treatment. CAUSES  There are many different causes of colitis, including:  Viruses.  Germs (bacteria).  Medicine reactions. SYMPTOMS   Diarrhea.  Intestinal bleeding.  Pain.  Fever.  Throwing up (vomiting).  Tiredness (fatigue).  Weight loss.  Bowel blockage. DIAGNOSIS  The diagnosis of colitis is based on examination and stool or blood tests. X-rays, CT scan, and colonoscopy may also be needed. TREATMENT  Treatment may include:  Fluids given through the vein (intravenously).  Bowel rest (nothing to eat or drink for a period of time).  Medicine for pain and diarrhea.  Medicines (antibiotics) that kill germs.  Cortisone  medicines.  Surgery. HOME CARE INSTRUCTIONS   Get plenty of rest.  Drink enough water and fluids to keep your urine clear or pale yellow.  Eat a well-balanced diet.  Call your caregiver for follow-up as recommended. SEEK IMMEDIATE MEDICAL CARE IF:   You develop chills.  You have an oral temperature above 102 F (38.9 C), not controlled by medicine.  You have extreme weakness, fainting, or dehydration.  You have repeated vomiting.  You develop severe belly (abdominal) pain or are passing bloody or tarry stools. MAKE SURE YOU:   Understand these instructions. Will watch your condition.Viral Gastroenteritis Viral gastroenteritis is also known as stomach flu. This condition affects the stomach and intestinal tract. It can cause sudden diarrhea and vomiting. The illness typically lasts 3 to 8 days. Most people develop an immune response that eventually gets rid of the virus. While this natural response develops, the virus can make you quite ill. CAUSES  Many different viruses can cause gastroenteritis, such as rotavirus or noroviruses. You can catch one of these viruses by consuming contaminated food or water. You may also catch a virus by sharing utensils or other personal items with an infected person or by touching a contaminated surface. SYMPTOMS  The most common symptoms are diarrhea and vomiting. These problems can cause a severe loss of body fluids (dehydration) and a body salt (electrolyte) imbalance. Other symptoms may include: Fever. Headache. Fatigue. Abdominal pain. DIAGNOSIS  Your caregiver can usually diagnose viral gastroenteritis based on your symptoms and a physical exam. A stool sample may also be taken to test for the presence of viruses or other infections. TREATMENT  This illness typically goes away on its own. Treatments are aimed at rehydration. The most serious cases of viral gastroenteritis involve vomiting so severely that you are not able to keep fluids  down. In these cases, fluids must be given through an intravenous line (IV). HOME CARE INSTRUCTIONS  Drink enough fluids to keep your urine clear or pale yellow. Drink small amounts of fluids frequently and increase the amounts as tolerated. Ask your caregiver for specific rehydration instructions. Avoid: Foods high in sugar. Alcohol. Carbonated drinks. Tobacco. Juice. Caffeine drinks. Extremely hot or cold fluids. Fatty, greasy foods. Too much intake of anything at one time. Dairy products until 24 to 48 hours after diarrhea stops. You may consume probiotics. Probiotics are active cultures of beneficial bacteria. They may lessen the amount and number of diarrheal stools in adults. Probiotics can be found in yogurt with active cultures and in supplements. Wash your hands well to avoid spreading the virus. Only take over-the-counter or prescription medicines for pain, discomfort, or fever as directed by your caregiver. Do not give aspirin to children. Antidiarrheal medicines are not recommended. Ask your caregiver if you should continue to take your regular prescribed  and over-the-counter medicines. Keep all follow-up appointments as directed by your caregiver. SEEK IMMEDIATE MEDICAL CARE IF:  You are unable to keep fluids down. You do not urinate at least once every 6 to 8 hours. You develop shortness of breath. You notice blood in your stool or vomit. This may look like coffee grounds. You have abdominal pain that increases or is concentrated in one small area (localized). You have persistent vomiting or diarrhea. You have a fever. The patient is a child younger than 3 months, and he or she has a fever. The patient is a child older than 3 months, and he or she has a fever and persistent symptoms. The patient is a child older than 3 months, and he or she has a fever and symptoms suddenly get worse. The patient is a baby, and he or she has no tears when crying. MAKE SURE YOU:    Understand these instructions. Will watch your condition. Will get help right away if you are not doing well or get worse. Document Released: 12/24/2004 Document Revised: 03/18/2011 Document Reviewed: 10/10/2010 [facility] Patient Information 2015 [place], [place]. This information is not intended to replace advice given to you by your health care provider. Make sure you discuss any questions you have with your health care provider.  Will get help right away if you are not doing well or get worse. Document Released: 02/01/2004 Document Revised: 03/18/2011 Document Reviewed: 04/28/2009 [facility] Patient Information 2015 [place], [place]. This information is not intended to replace advice given to you by your health care provider. Make sure you discuss any questions you have with your health care provider. Hemorrhoids Hemorrhoids are swollen veins around the rectum or anus. There are two types of hemorrhoids:   Internal hemorrhoids. These occur in the veins just inside the rectum. They may poke through to the outside and become irritated and painful.  External hemorrhoids. These occur in the veins outside the anus and can be felt as a painful swelling or hard lump near the anus. CAUSES  Pregnancy.   Obesity.   Constipation or diarrhea.   Straining to have a bowel movement.   Sitting for long periods on the toilet.  Heavy lifting or other activity that caused you to strain.  Anal intercourse. SYMPTOMS   Pain.   Anal itching or irritation.   Rectal bleeding.   Fecal leakage.   Anal swelling.   One or more lumps around the anus.  DIAGNOSIS  Your caregiver may be able to diagnose hemorrhoids by visual examination. Other examinations or tests that may be performed include:   Examination of the rectal area with a gloved hand (digital rectal exam).   Examination of anal canal using a small tube (scope).   A blood test if you have lost a significant amount of  blood.  A test to look inside the colon (sigmoidoscopy or colonoscopy). TREATMENT Most hemorrhoids can be treated at home. However, if symptoms do not seem to be getting better or if you have a lot of rectal bleeding, your caregiver may perform a procedure to help make the hemorrhoids get smaller or remove them completely. Possible treatments include:   Placing a rubber band at the base of the hemorrhoid to cut off the circulation (rubber band ligation).   Injecting a chemical to shrink the hemorrhoid (sclerotherapy).   Using a tool to burn the hemorrhoid (infrared light therapy).   Surgically removing the hemorrhoid (hemorrhoidectomy).   Stapling the hemorrhoid to block blood flow to the  tissue (hemorrhoid stapling).  HOME CARE INSTRUCTIONS   Eat foods with fiber, such as whole grains, beans, nuts, fruits, and vegetables. Ask your doctor about taking products with added fiber in them (fibersupplements).  Increase fluid intake. Drink enough water and fluids to keep your urine clear or pale yellow.   Exercise regularly.   Go to the bathroom when you have the urge to have a bowel movement. Do not wait.   Avoid straining to have bowel movements.   Keep the anal area dry and clean. Use wet toilet paper or moist towelettes after a bowel movement.   Medicated creams and suppositories may be used or applied as directed.   Only take over-the-counter or prescription medicines as directed by your caregiver.   Take warm sitz baths for 15-20 minutes, 3-4 times a day to ease pain and discomfort.   Place ice packs on the hemorrhoids if they are tender and swollen. Using ice packs between sitz baths may be helpful.   Put ice in a plastic bag.   Place a towel between your skin and the bag.   Leave the ice on for 15-20 minutes, 3-4 times a day.   Do not use a donut-shaped pillow or sit on the toilet for long periods. This increases blood pooling and pain.  SEEK MEDICAL  CARE IF:  You have increasing pain and swelling that is not controlled by treatment or medicine.  You have uncontrolled bleeding.  You have difficulty or you are unable to have a bowel movement.  You have pain or inflammation outside the area of the hemorrhoids. MAKE SURE YOU:  Understand these instructions.  Will watch your condition.  Will get help right away if you are not doing well or get worse. Document Released: 12/22/1999 Document Revised: 12/11/2011 Document Reviewed: 10/29/2011 [facility] Patient Information 2015 [place], [place]. This information is not intended to replace advice given to you by your health care provider. Make sure you discuss any questions you have with your health care provider.

## 2013-08-07 NOTE — Progress Notes (Signed)
Attempted to complete discharge instructions with patient. Pt refused to listen and stated she would "read it when she got home". Pt talking about smoking a cigarette and drinking beer when she got home. She was caught leaving with the SCD machine in a trash bag. Charge RN stopped pt and informed her that she couldn't take it home. Pt home meds returned and cigarette and lighter returned as well.

## 2013-08-09 ENCOUNTER — Encounter (HOSPITAL_COMMUNITY): Payer: Self-pay | Admitting: Internal Medicine

## 2013-08-10 LAB — GI PATHOGEN PANEL BY PCR, STOOL
C difficile toxin A/B: NEGATIVE
Campylobacter by PCR: NEGATIVE
Cryptosporidium by PCR: NEGATIVE
E COLI (ETEC) LT/ST: NEGATIVE
E COLI (STEC): NEGATIVE
E COLI 0157 BY PCR: NEGATIVE
G lamblia by PCR: NEGATIVE
Norovirus GI/GII: NEGATIVE
Rotavirus A by PCR: NEGATIVE
Salmonella by PCR: NEGATIVE
Shigella by PCR: NEGATIVE

## 2013-09-09 ENCOUNTER — Encounter: Payer: Medicaid Other | Admitting: Internal Medicine

## 2013-11-13 ENCOUNTER — Encounter (HOSPITAL_COMMUNITY): Payer: Self-pay | Admitting: *Deleted

## 2013-11-13 ENCOUNTER — Emergency Department (HOSPITAL_COMMUNITY)
Admission: EM | Admit: 2013-11-13 | Discharge: 2013-11-13 | Disposition: A | Discharge status: Home / Self Care | Payer: Medicaid Other | Attending: Emergency Medicine | Admitting: Emergency Medicine

## 2013-11-13 ENCOUNTER — Emergency Department (HOSPITAL_COMMUNITY): Payer: Medicaid Other

## 2013-11-13 DIAGNOSIS — F419 Anxiety disorder, unspecified: Secondary | ICD-10-CM | POA: Insufficient documentation

## 2013-11-13 DIAGNOSIS — M1990 Unspecified osteoarthritis, unspecified site: Secondary | ICD-10-CM | POA: Insufficient documentation

## 2013-11-13 DIAGNOSIS — J45901 Unspecified asthma with (acute) exacerbation: Secondary | ICD-10-CM | POA: Diagnosis not present

## 2013-11-13 DIAGNOSIS — J439 Emphysema, unspecified: Secondary | ICD-10-CM | POA: Diagnosis not present

## 2013-11-13 DIAGNOSIS — Z79899 Other long term (current) drug therapy: Secondary | ICD-10-CM | POA: Insufficient documentation

## 2013-11-13 DIAGNOSIS — Z8719 Personal history of other diseases of the digestive system: Secondary | ICD-10-CM | POA: Diagnosis not present

## 2013-11-13 DIAGNOSIS — Z720 Tobacco use: Secondary | ICD-10-CM | POA: Diagnosis not present

## 2013-11-13 DIAGNOSIS — J40 Bronchitis, not specified as acute or chronic: Secondary | ICD-10-CM

## 2013-11-13 DIAGNOSIS — R0602 Shortness of breath: Secondary | ICD-10-CM | POA: Diagnosis present

## 2013-11-13 LAB — COMPREHENSIVE METABOLIC PANEL
ALT: 14 U/L (ref 0–35)
ANION GAP: 13 (ref 5–15)
AST: 19 U/L (ref 0–37)
Albumin: 3.8 g/dL (ref 3.5–5.2)
Alkaline Phosphatase: 68 U/L (ref 39–117)
BILIRUBIN TOTAL: 0.3 mg/dL (ref 0.3–1.2)
BUN: 12 mg/dL (ref 6–23)
CALCIUM: 9.6 mg/dL (ref 8.4–10.5)
CHLORIDE: 104 meq/L (ref 96–112)
CO2: 25 meq/L (ref 19–32)
Creatinine, Ser: 1 mg/dL (ref 0.50–1.10)
GFR calc non Af Amer: 58 mL/min — ABNORMAL LOW (ref >90)
GFR, EST AFRICAN AMERICAN: 67 mL/min — AB (ref >90)
Glucose, Bld: 105 mg/dL — ABNORMAL HIGH (ref 70–99)
Potassium: 3.9 mEq/L (ref 3.7–5.3)
Sodium: 142 mEq/L (ref 137–147)
Total Protein: 7.3 g/dL (ref 6.0–8.3)

## 2013-11-13 LAB — CBC
HCT: 43.7 % (ref 36.0–46.0)
Hemoglobin: 14.5 g/dL (ref 12.0–15.0)
MCH: 28 pg (ref 26.0–34.0)
MCHC: 33.2 g/dL (ref 30.0–36.0)
MCV: 84.4 fL (ref 78.0–100.0)
PLATELETS: 255 10*3/uL (ref 150–400)
RBC: 5.18 MIL/uL — AB (ref 3.87–5.11)
RDW: 13.3 % (ref 11.5–15.5)
WBC: 4.1 10*3/uL (ref 4.0–10.5)

## 2013-11-13 LAB — PRO B NATRIURETIC PEPTIDE: Pro B Natriuretic peptide (BNP): 52 pg/mL (ref 0–125)

## 2013-11-13 LAB — I-STAT TROPONIN, ED: TROPONIN I, POC: 0 ng/mL (ref 0.00–0.08)

## 2013-11-13 MED ORDER — ALBUTEROL (5 MG/ML) CONTINUOUS INHALATION SOLN
10.0000 mg/h | INHALATION_SOLUTION | Freq: Once | RESPIRATORY_TRACT | Status: AC
  Administered 2013-11-13: 10 mg/h via RESPIRATORY_TRACT
  Filled 2013-11-13: qty 20

## 2013-11-13 MED ORDER — IPRATROPIUM BROMIDE 0.02 % IN SOLN
0.5000 mg | Freq: Once | RESPIRATORY_TRACT | Status: AC
  Administered 2013-11-13: 0.5 mg via RESPIRATORY_TRACT
  Filled 2013-11-13: qty 2.5

## 2013-11-13 MED ORDER — ALBUTEROL SULFATE (2.5 MG/3ML) 0.083% IN NEBU
5.0000 mg | INHALATION_SOLUTION | Freq: Once | RESPIRATORY_TRACT | Status: AC
  Administered 2013-11-13: 5 mg via RESPIRATORY_TRACT
  Filled 2013-11-13: qty 6

## 2013-11-13 MED ORDER — IPRATROPIUM-ALBUTEROL 0.5-2.5 (3) MG/3ML IN SOLN
3.0000 mL | Freq: Once | RESPIRATORY_TRACT | Status: AC
  Administered 2013-11-13: 3 mL via RESPIRATORY_TRACT

## 2013-11-13 MED ORDER — PREDNISONE 20 MG PO TABS: 60.0000 mg | ORAL_TABLET | Freq: Every day | ORAL | Status: DC

## 2013-11-13 MED ORDER — GUAIFENESIN-CODEINE 100-10 MG/5ML PO SOLN: 5.0000 mL | Freq: Four times a day (QID) | ORAL | Status: DC | PRN

## 2013-11-13 MED ORDER — GUAIFENESIN-CODEINE 100-10 MG/5ML PO SOLN
10.0000 mL | ORAL | Status: DC | PRN
  Administered 2013-11-13: 10 mL via ORAL
  Filled 2013-11-13: qty 10

## 2013-11-13 MED ORDER — ALBUTEROL SULFATE (2.5 MG/3ML) 0.083% IN NEBU: 2.5000 mg | INHALATION_SOLUTION | Freq: Four times a day (QID) | RESPIRATORY_TRACT | Status: DC | PRN

## 2013-11-13 MED ORDER — METHYLPREDNISOLONE SODIUM SUCC 125 MG IJ SOLR
125.0000 mg | Freq: Once | INTRAMUSCULAR | Status: AC
  Administered 2013-11-13: 125 mg via INTRAVENOUS
  Filled 2013-11-13: qty 2

## 2013-11-13 NOTE — ED Notes (Signed)
MD at bedside. 

## 2013-11-13 NOTE — ED Notes (Signed)
The pt has asthma and has had diff breathing for one month.  Audible wheezes.  She has run out of asthma meds for her hhn at home.  coughing

## 2013-11-13 NOTE — Discharge Instructions (Signed)
Asthma Asthma is a recurring condition in which the airways tighten and narrow. Asthma can make it difficult to breathe. It can cause coughing, wheezing, and shortness of breath. Asthma episodes, also called asthma attacks, range from minor to life-threatening. Asthma cannot be cured, but medicines and lifestyle changes can help control it. CAUSES Asthma is believed to be caused by inherited (genetic) and environmental factors, but its exact cause is unknown. Asthma may be triggered by allergens, lung infections, or irritants in the air. Asthma triggers are different for each person. Common triggers include:   Animal dander.  Dust mites.  Cockroaches.  Pollen from trees or grass.  Mold.  Smoke.  Air pollutants such as dust, household cleaners, hair sprays, aerosol sprays, paint fumes, strong chemicals, or strong odors.  Cold air, weather changes, and winds (which increase molds and pollens in the air).  Strong emotional expressions such as crying or laughing hard.  Stress.  Certain medicines (such as aspirin) or types of drugs (such as beta-blockers).  Sulfites in foods and drinks. Foods and drinks that may contain sulfites include dried fruit, potato chips, and sparkling grape juice.  Infections or inflammatory conditions such as the flu, a cold, or an inflammation of the nasal membranes (rhinitis).  Gastroesophageal reflux disease (GERD).  Exercise or strenuous activity. SYMPTOMS Symptoms may occur immediately after asthma is triggered or many hours later. Symptoms include:  Wheezing.  Excessive nighttime or early morning coughing.  Frequent or severe coughing with a common cold.  Chest tightness.  Shortness of breath. DIAGNOSIS  The diagnosis of asthma is made by a review of your medical history and a physical exam. Tests may also be performed. These may include:  Lung function studies. These tests show how much air you breathe in and out.  Allergy  tests.  Imaging tests such as X-rays. TREATMENT  Asthma cannot be cured, but it can usually be controlled. Treatment involves identifying and avoiding your asthma triggers. It also involves medicines. There are 2 classes of medicine used for asthma treatment:   Controller medicines. These prevent asthma symptoms from occurring. They are usually taken every day.  Reliever or rescue medicines. These quickly relieve asthma symptoms. They are used as needed and provide short-term relief. Your health care provider will help you create an asthma action plan. An asthma action plan is a written plan for managing and treating your asthma attacks. It includes a list of your asthma triggers and how they may be avoided. It also includes information on when medicines should be taken and when their dosage should be changed. An action plan may also involve the use of a device called a peak flow meter. A peak flow meter measures how well the lungs are working. It helps you monitor your condition. HOME CARE INSTRUCTIONS   Take medicines only as directed by your health care provider. Speak with your health care provider if you have questions about how or when to take the medicines.  Use a peak flow meter as directed by your health care provider. Record and keep track of readings.  Understand and use the action plan to help minimize or stop an asthma attack without needing to seek medical care.  Control your home environment in the following ways to help prevent asthma attacks:  Do not smoke. Avoid being exposed to secondhand smoke.  Change your heating and air conditioning filter regularly.  Limit your use of fireplaces and wood stoves.  Get rid of pests (such as roaches and  mice) and their droppings.  Throw away plants if you see mold on them.  Clean your floors and dust regularly. Use unscented cleaning products.  Try to have someone else vacuum for you regularly. Stay out of rooms while they are  being vacuumed and for a short while afterward. If you vacuum, use a dust mask from a hardware store, a double-layered or microfilter vacuum cleaner bag, or a vacuum cleaner with a HEPA filter.  Replace carpet with wood, tile, or vinyl flooring. Carpet can trap dander and dust.  Use allergy-proof pillows, mattress covers, and box spring covers.  Wash bed sheets and blankets every week in hot water and dry them in a dryer.  Use blankets that are made of polyester or cotton.  Clean bathrooms and kitchens with bleach. If possible, have someone repaint the walls in these rooms with mold-resistant paint. Keep out of the rooms that are being cleaned and painted.  Wash hands frequently. SEEK MEDICAL CARE IF:   You have wheezing, shortness of breath, or a cough even if taking medicine to prevent attacks.  The colored mucus you cough up (sputum) is thicker than usual.  Your sputum changes from clear or white to yellow, green, gray, or bloody.  You have any problems that may be related to the medicines you are taking (such as a rash, itching, swelling, or trouble breathing).  You are using a reliever medicine more than 2-3 times per week.  Your peak flow is still at 50-79% of your personal best after following your action plan for 1 hour.  You have a fever. SEEK IMMEDIATE MEDICAL CARE IF:   You seem to be getting worse and are unresponsive to treatment during an asthma attack.  You are short of breath even at rest.  You get short of breath when doing very little physical activity.  You have difficulty eating, drinking, or talking due to asthma symptoms.  You develop chest pain.  You develop a fast heartbeat.  You have a bluish color to your lips or fingernails.  You are light-headed, dizzy, or faint.  Your peak flow is less than 50% of your personal best. MAKE SURE YOU:   Understand these instructions.  Will watch your condition.  Will get help right away if you are not  doing well or get worse. Document Released: 12/24/2004 Document Revised: 05/10/2013 Document Reviewed: 07/23/2012 [facility] Patient Information 2015 [place], [place]. This information is not intended to replace advice given to you by your health care provider. Make sure you discuss any questions you have with your health care provider.  Cough, Adult  A cough is a reflex that helps clear your throat and airways. It can help heal the body or may be a reaction to an irritated airway. A cough may only last 2 or 3 weeks (acute) or may last more than 8 weeks (chronic).  CAUSES Acute cough:  Viral or bacterial infections. Chronic cough:  Infections.  Allergies.  Asthma.  Post-nasal drip.  Smoking.  Heartburn or acid reflux.  Some medicines.  Chronic lung problems (COPD).  Cancer. SYMPTOMS   Cough.  Fever.  Chest pain.  Increased breathing rate.  High-pitched whistling sound when breathing (wheezing).  Colored mucus that you cough up (sputum). TREATMENT   A bacterial cough may be treated with antibiotic medicine.  A viral cough must run its course and will not respond to antibiotics.  Your caregiver may recommend other treatments if you have a chronic cough. HOME CARE INSTRUCTIONS  Only take over-the-counter or prescription medicines for pain, discomfort, or fever as directed by your caregiver. Use cough suppressants only as directed by your caregiver.  Use a cold steam vaporizer or humidifier in your bedroom or home to help loosen secretions.  Sleep in a semi-upright position if your cough is worse at night.  Rest as needed.  Stop smoking if you smoke. SEEK IMMEDIATE MEDICAL CARE IF:   You have pus in your sputum.  Your cough starts to worsen.  You cannot control your cough with suppressants and are losing sleep.  You begin coughing up blood.  You have difficulty breathing.  You develop pain which is getting worse or is uncontrolled with  medicine.  You have a fever. MAKE SURE YOU:   Understand these instructions.  Will watch your condition.  Will get help right away if you are not doing well or get worse. Document Released: 06/22/2010 Document Revised: 03/18/2011 Document Reviewed: 06/22/2010 [facility] Patient Information 2015 [place], [place]. This information is not intended to replace advice given to you by your health care provider. Make sure you discuss any questions you have with your health care provider.  Chronic Asthmatic Bronchitis Chronic asthmatic bronchitis is a complication of persistent asthma. After a period of time with asthma, some people develop airflow obstruction that is present all the time, even when not having an asthma attack.There is also persistent inflammation of the airways, and the bronchial tubes produce more mucus. Chronic asthmatic bronchitis usually is a permanent problem with the lungs. CAUSES  Chronic asthmatic bronchitis happens most often in people who have asthma and also smoke cigarettes. Occasionally, it can happen to a person with long-standing or severe asthma even if the person is not a smoker. SIGNS AND SYMPTOMS  Chronic asthmatic bronchitis usually causes symptoms of both asthma and chronic bronchitis, including:   Coughing.  Increased sputum production.  Wheezing and shortness of breath.  Chest discomfort.  Recurring infections. DIAGNOSIS  Your health care provider will take a medical history and perform a physical exam. Chronic asthmatic bronchitis is suspected when a person with asthma has abnormal results on breathing tests (pulmonary function tests) even when breathing symptoms are at their best. Other tests, such as a chest X-ray, may be performed to rule out other conditions.  TREATMENT  Treatment involves controlling symptoms with medicine and lifestyle changes.  Your health care provider may prescribe asthma medicines, including inhaler and nebulizer  medicines.  Infection can be treated with medicine to kill germs (antibiotics). Serious infections may require hospitalization. These can include:  Pneumonia.  Sinus infections.  Acute bronchitis.   Preventing infection and hospitalization is very important. Get an influenza vaccination every year as directed by your health care provider. Ask your health care provider whether you need a pneumonia vaccine.  Ask your health care provider whether you would benefit from a pulmonary rehabilitation program. HOME CARE INSTRUCTIONS  Take medicines only as directed by your health care provider.  If you are a cigarette smoker, the most important thing that you can do is quit. Talk to your health care provider for help with quitting smoking.  Avoid pollen, dust, animal dander, molds, smoke, and other things that cause attacks.  Regular exercise is very important to help you feel better. Discuss possible exercise routines with your health care provider.  If animal dander is the cause of asthma, you may not be able to keep pets.  It is important that you:  Become educated about your medical condition.  Participate in maintaining wellness.  Seek medical care as directed. Delay in seeking medical care could cause permanent injury and may be a risk to your life. SEEK MEDICAL CARE IF:  You have wheezing and shortness of breath even if taking medicine to prevent attacks.  You have muscle aches, chest pain, or thickening of sputum.  Your sputum changes from clear or white to yellow, green, gray, or bloody. SEEK IMMEDIATE MEDICAL CARE IF:  Your usual medicines do not stop your wheezing.  You have increased coughing or shortness of breath or both.  You have increased difficulty breathing.  You have any problems from the medicine you are taking, such as a rash, itching, swelling, or trouble breathing. MAKE SURE YOU:   Understand these instructions.  Will watch your condition.  Will  get help right away if you are not doing well or get worse. Document Released: 10/11/2005 Document Revised: 05/10/2013 Document Reviewed: 02/01/2013 [facility] Patient Information 2015 [place], [place]. This information is not intended to replace advice given to you by your health care provider. Make sure you discuss any questions you have with your health care provider.

## 2013-11-13 NOTE — ED Provider Notes (Signed)
CSN: [id number]     Arrival date & time 11/13/13  0229 History   First MD Initiated Contact with Patient 11/13/13 [phone]     Chief Complaint  Patient presents with  . Shortness of Breath     (Consider location/radiation/quality/duration/timing/severity/associated sxs/prior Treatment) HPI 63 yo female presents to the ER from home with complaint of "cold" for one month.  Pt has taken multiple over the counter medications for cough, congestion without relief.  She has been using her inhaler without improvement.  She has run out of her neb solution.  Pt has h/o bronchitis, emphysema, asthma, and is still smoking, but has cut back.  No fevers.  No sputum change. Past Medical History  Diagnosis Date  . Emphysema   . Asthma   . Polysubstance abuse 2011    4 day [facility] admission for this. Cocaine, ETOH, MJA,   . Anxiety 2011  . Hemorrhoids   . Arthritis    Past Surgical History  Procedure Laterality Date  . Incision and drainage  12/2009    of thrombosed internal hemorrhoid  . Colonoscopy N/A 08/07/2013    Procedure: COLONOSCOPY;  Surgeon: Iva Boop, MD;  Location: [facility] ENDOSCOPY;  Service: Endoscopy;  Laterality: N/A;   Family History  Problem Relation Age of Onset  . Emphysema Father   . Asthma Sister   . Lung cancer Father     was a smoker  . Heart disease Father   . Uterine cancer Mother   . Prostate cancer Father    History  Substance Use Topics  . Smoking status: Current Every Day Smoker -- 0.50 packs/day for 50 years    Types: Cigarettes  . Smokeless tobacco: Never Used  . Alcohol Use: Yes     Comment: States she will drink a 40 oz beer of a pint of liquor over a weekend.   OB History    No data available     Review of Systems  All other systems reviewed and are negative.     Allergies  Lorazepam; Advil; and Vicodin  Home Medications   Prior to Admission medications   Medication Sig Start Date End Date Taking? Authorizing Provider  albuterol  (PROVENTIL HFA;VENTOLIN HFA) 108 (90 BASE) MCG/ACT inhaler Inhale 2 puffs into the lungs every 6 (six) hours as needed for wheezing. 09/14/12   Kristen N Ward, DO  albuterol (PROVENTIL) (2.5 MG/3ML) 0.083% nebulizer solution Take 3 mLs (2.5 mg total) by nebulization every 6 (six) hours as needed for wheezing. 11/13/13   Olivia Mackie, MD  bisacodyl (DULCOLAX) 5 MG EC tablet Take 1 tablet (5 mg total) by mouth 2 (two) times daily. 08/07/13   Maryann Mikhail, DO  cyclobenzaprine (FLEXERIL) 10 MG tablet Take 10 mg by mouth 3 (three) times daily as needed for muscle spasms.    Historical Provider, MD  dicyclomine (BENTYL) 20 MG tablet Take 1 tablet (20 mg total) by mouth 4 (four) times daily -  before meals and at bedtime. 08/06/13   Maryann Mikhail, DO  feeding supplement, RESOURCE BREEZE, (RESOURCE BREEZE) LIQD Take 1 Container by mouth 3 (three) times daily between meals. 08/07/13   Maryann Mikhail, DO  gabapentin (NEURONTIN) 300 MG capsule Take 300 mg by mouth 2 (two) times daily.    Historical Provider, MD  guaiFENesin-codeine 100-10 MG/5ML syrup Take 5 mLs by mouth every 6 (six) hours as needed for cough. 11/13/13   Olivia Mackie, MD  nicotine (NICODERM CQ - DOSED IN MG/24 HOURS)  21 mg/24hr patch Place 1 patch (21 mg total) onto the skin daily. 08/06/13   Maryann Mikhail, DO  oxyCODONE-acetaminophen (PERCOCET/ROXICET) 5-325 MG per tablet Take 1 tablet by mouth 3 (three) times daily as needed for severe pain. 08/06/13   Maryann Mikhail, DO  predniSONE (DELTASONE) 20 MG tablet Take 3 tablets (60 mg total) by mouth daily. 11/13/13   Olivia Mackie, MD  QUEtiapine (SEROQUEL) 300 MG tablet Take 300 mg by mouth at bedtime.    Historical Provider, MD   BP 112/68 mmHg  Pulse 63  Temp(Src) 97.6 F (36.4 C) (Oral)  Resp 15  Ht 5\' 6"  (1.676 m)  Wt 146 lb (66.225 kg)  BMI 23.58 kg/m2  SpO2 100% Physical Exam  Constitutional: She is oriented to person, place, and time. She appears well-developed and well-nourished. She  appears distressed (uncomfortable appearing, cough present).  HENT:  Head: Normocephalic and atraumatic.  Right Ear: External ear normal.  Left Ear: External ear normal.  Nose: Nose normal.  Mouth/Throat: Oropharynx is clear and moist.  Eyes: Conjunctivae and EOM are normal. Pupils are equal, round, and reactive to light.  Neck: Normal range of motion. Neck supple. No JVD present. No tracheal deviation present. No thyromegaly present.  Cardiovascular: Normal rate, regular rhythm, normal heart sounds and intact distal pulses.  Exam reveals no gallop and no friction rub.   No murmur heard. Pulmonary/Chest: Effort normal. No stridor. No respiratory distress. She has wheezes. She has no rales. She exhibits no tenderness.  Abdominal: Soft. Bowel sounds are normal. She exhibits no distension and no mass. There is no tenderness. There is no rebound and no guarding.  Musculoskeletal: Normal range of motion. She exhibits no edema or tenderness.  Lymphadenopathy:    She has no cervical adenopathy.  Neurological: She is alert and oriented to person, place, and time. She displays normal reflexes. She exhibits normal muscle tone. Coordination normal.  Skin: Skin is warm and dry. No rash noted. No erythema. No pallor.  Psychiatric: She has a normal mood and affect. Her behavior is normal. Judgment and thought content normal.  Nursing note and vitals reviewed.   ED Course  Procedures (including critical care time) Labs Review Labs Reviewed  COMPREHENSIVE METABOLIC PANEL - Abnormal; Notable for the following:    Glucose, Bld 105 (*)    GFR calc non Af Amer 58 (*)    GFR calc Af Amer 67 (*)    All other components within normal limits  CBC - Abnormal; Notable for the following:    RBC 5.18 (*)    All other components within normal limits  PRO B NATRIURETIC PEPTIDE  I-STAT TROPOININ, ED    Imaging Review Dg Chest Port 1 View  11/13/2013   CLINICAL DATA:  Initial evaluation for acute shortness  of breath.  EXAM: PORTABLE CHEST - 1 VIEW  COMPARISON:  Prior radiograph from 09/14/2012  FINDINGS: Cardiomegaly is stable from prior study. Mediastinal silhouette within normal limits.  Lungs are well inflated. No focal infiltrate, pulmonary edema, or pleural effusion. Mild diffuse peribronchial thickening present. No pneumothorax.  No acute osseus abnormality.  IMPRESSION: 1. Mild diffuse peribronchial thickening, which may be related to history reactive airways disease. No superimposed focal infiltrates identified. 2. Stable cardiomegaly without pulmonary edema.   Electronically Signed   By: 11/14/2012 M.D.   On: 11/13/2013 03:50     EKG Interpretation None      MDM   Final diagnoses:  Bronchitis  Asthma exacerbation  63 year old female with asthma exacerbation.  She is still smoking.  Symptoms ongoing for the last month, has run out of her albuterol neb solution.  Patient reports that she does have a primary care doctor.  Wheezing much improved after hour-long neb.  Plan for 5 days of prednisone burst, cough suppressant therapy, and refill of her albuterol neb solution.    Olivia Mackie, MD 11/13/13 [phone]

## 2014-04-15 ENCOUNTER — Emergency Department (HOSPITAL_COMMUNITY)
Admission: EM | Admit: 2014-04-15 | Discharge: 2014-04-15 | Disposition: A | Discharge status: Home / Self Care | Payer: Medicaid Other | Attending: Emergency Medicine | Admitting: Emergency Medicine

## 2014-04-15 ENCOUNTER — Emergency Department (HOSPITAL_COMMUNITY): Payer: Medicaid Other

## 2014-04-15 ENCOUNTER — Encounter (HOSPITAL_COMMUNITY): Payer: Self-pay | Admitting: *Deleted

## 2014-04-15 DIAGNOSIS — F419 Anxiety disorder, unspecified: Secondary | ICD-10-CM | POA: Diagnosis not present

## 2014-04-15 DIAGNOSIS — R0602 Shortness of breath: Secondary | ICD-10-CM | POA: Diagnosis present

## 2014-04-15 DIAGNOSIS — Z7952 Long term (current) use of systemic steroids: Secondary | ICD-10-CM | POA: Diagnosis not present

## 2014-04-15 DIAGNOSIS — M1990 Unspecified osteoarthritis, unspecified site: Secondary | ICD-10-CM | POA: Diagnosis not present

## 2014-04-15 DIAGNOSIS — Z8719 Personal history of other diseases of the digestive system: Secondary | ICD-10-CM | POA: Diagnosis not present

## 2014-04-15 DIAGNOSIS — Z720 Tobacco use: Secondary | ICD-10-CM | POA: Diagnosis not present

## 2014-04-15 DIAGNOSIS — Z79899 Other long term (current) drug therapy: Secondary | ICD-10-CM | POA: Diagnosis not present

## 2014-04-15 DIAGNOSIS — J441 Chronic obstructive pulmonary disease with (acute) exacerbation: Secondary | ICD-10-CM | POA: Insufficient documentation

## 2014-04-15 HISTORY — DX: Tobacco use: Z72.0

## 2014-04-15 LAB — COMPREHENSIVE METABOLIC PANEL WITH GFR
ALT: 15 U/L (ref 0–35)
AST: 18 U/L (ref 0–37)
Albumin: 3.8 g/dL (ref 3.5–5.2)
Alkaline Phosphatase: 59 U/L (ref 39–117)
Anion gap: 6 (ref 5–15)
BUN: 7 mg/dL (ref 6–23)
CO2: 30 mmol/L (ref 19–32)
Calcium: 9.3 mg/dL (ref 8.4–10.5)
Chloride: 107 mmol/L (ref 96–112)
Creatinine, Ser: 1.02 mg/dL (ref 0.50–1.10)
GFR calc Af Amer: 66 mL/min — ABNORMAL LOW
GFR calc non Af Amer: 57 mL/min — ABNORMAL LOW
Glucose, Bld: 91 mg/dL (ref 70–99)
Potassium: 4.4 mmol/L (ref 3.5–5.1)
Sodium: 143 mmol/L (ref 135–145)
Total Bilirubin: 0.4 mg/dL (ref 0.3–1.2)
Total Protein: 6.5 g/dL (ref 6.0–8.3)

## 2014-04-15 LAB — CBC WITH DIFFERENTIAL/PLATELET
Basophils Absolute: 0 K/uL (ref 0.0–0.1)
Basophils Relative: 0 % (ref 0–1)
Eosinophils Absolute: 0.1 K/uL (ref 0.0–0.7)
Eosinophils Relative: 3 % (ref 0–5)
HCT: 42.8 % (ref 36.0–46.0)
Hemoglobin: 14 g/dL (ref 12.0–15.0)
Lymphocytes Relative: 31 % (ref 12–46)
Lymphs Abs: 1.4 K/uL (ref 0.7–4.0)
MCH: 28.2 pg (ref 26.0–34.0)
MCHC: 32.7 g/dL (ref 30.0–36.0)
MCV: 86.1 fL (ref 78.0–100.0)
Monocytes Absolute: 0.5 K/uL (ref 0.1–1.0)
Monocytes Relative: 11 % (ref 3–12)
Neutro Abs: 2.5 K/uL (ref 1.7–7.7)
Neutrophils Relative %: 55 % (ref 43–77)
Platelets: 265 K/uL (ref 150–400)
RBC: 4.97 MIL/uL (ref 3.87–5.11)
RDW: 13.4 % (ref 11.5–15.5)
WBC: 4.6 K/uL (ref 4.0–10.5)

## 2014-04-15 LAB — I-STAT TROPONIN, ED: Troponin i, poc: 0 ng/mL (ref 0.00–0.08)

## 2014-04-15 LAB — D-DIMER, QUANTITATIVE: D-Dimer, Quant: 0.54 ug{FEU}/mL — ABNORMAL HIGH (ref 0.00–0.48)

## 2014-04-15 LAB — BRAIN NATRIURETIC PEPTIDE: B NATRIURETIC PEPTIDE 5: 279.5 pg/mL — AB (ref 0.0–100.0)

## 2014-04-15 MED ORDER — PREDNISONE 20 MG PO TABS: 40.0000 mg | ORAL_TABLET | Freq: Every day | ORAL | Status: DC

## 2014-04-15 MED ORDER — ALBUTEROL SULFATE (2.5 MG/3ML) 0.083% IN NEBU
2.5000 mg | INHALATION_SOLUTION | Freq: Once | RESPIRATORY_TRACT | Status: AC
  Administered 2014-04-15: 2.5 mg via RESPIRATORY_TRACT
  Filled 2014-04-15: qty 3

## 2014-04-15 MED ORDER — DOXYCYCLINE HYCLATE 100 MG PO TABS: 100.0000 mg | ORAL_TABLET | Freq: Two times a day (BID) | ORAL | Status: DC

## 2014-04-15 MED ORDER — IPRATROPIUM-ALBUTEROL 0.5-2.5 (3) MG/3ML IN SOLN
3.0000 mL | Freq: Once | RESPIRATORY_TRACT | Status: AC
  Administered 2014-04-15: 3 mL via RESPIRATORY_TRACT
  Filled 2014-04-15: qty 3

## 2014-04-15 MED ORDER — IOHEXOL 350 MG/ML SOLN
80.0000 mL | Freq: Once | INTRAVENOUS | Status: AC | PRN
  Administered 2014-04-15: 80 mL via INTRAVENOUS

## 2014-04-15 MED ORDER — OXYCODONE-ACETAMINOPHEN 5-325 MG PO TABS
2.0000 | ORAL_TABLET | Freq: Once | ORAL | Status: AC
  Administered 2014-04-15: 2 via ORAL
  Filled 2014-04-15: qty 2

## 2014-04-15 MED ORDER — ALBUTEROL SULFATE (2.5 MG/3ML) 0.083% IN NEBU: 2.5000 mg | INHALATION_SOLUTION | RESPIRATORY_TRACT | Status: AC | PRN

## 2014-04-15 MED ORDER — PREDNISONE 20 MG PO TABS
60.0000 mg | ORAL_TABLET | Freq: Once | ORAL | Status: AC
  Administered 2014-04-15: 60 mg via ORAL
  Filled 2014-04-15: qty 3

## 2014-04-15 NOTE — ED Notes (Signed)
Pt. oob to the bathroom, gait steady.  

## 2014-04-15 NOTE — ED Notes (Signed)
Patient wont leave her pulse ox on.

## 2014-04-15 NOTE — ED Notes (Signed)
Lunch tray ordered 

## 2014-04-15 NOTE — ED Notes (Signed)
Patient reports she has had cold since Sunday.  She reports no fever but she is coughing up green colored sputum.  Patient states she is sob at rest.  Patient has tried otc meds w/o relief.  Patient has also used inhaler at home w/o relief.  Last used at .  Patient has sob when sitting.  Denies chest pain but states she has pain in the left lung around to her back.

## 2014-04-15 NOTE — Discharge Instructions (Signed)
°Emergency Department Resource Guide °1) Find a Doctor and Pay Out of Pocket °Although you won't have to find out who is covered by your insurance plan, it is a good idea to ask around and get recommendations. You will then need to call the office and see if the doctor you have chosen will accept you as a new patient and what types of options they offer for patients who are self-pay. Some doctors offer discounts or will set up payment plans for their patients who do not have insurance, but you will need to ask so you aren't surprised when you get to your appointment. ° °2) Contact Your Local Health Department °Not all health departments have doctors that can see patients for sick visits, but many do, so it is worth a call to see if yours does. If you don't know where your local health department is, you can check in your phone book. The CDC also has a tool to help you locate your state's health department, and many state websites also have listings of all of their local health departments. ° °3) Find a Walk-in Clinic °If your illness is not likely to be very severe or complicated, you may want to try a walk in clinic. These are popping up all over the country in pharmacies, drugstores, and shopping centers. They're usually staffed by nurse practitioners or physician assistants that have been trained to treat common illnesses and complaints. They're usually fairly quick and inexpensive. However, if you have serious medical issues or chronic medical problems, these are probably not your best option. ° °No Primary Care Doctor: °- Call [facility] at  832-8000 - they can help you locate a primary care doctor that  accepts your insurance, provides certain services, etc. °- Physician Referral Service- 1-800-533-3463 ° °Chronic Pain Problems: °Organization         Address  Phone   Notes  °[facility]  (336) [phone] Patients need to be referred by their primary care doctor.  ° °Medication  Assistance: °Organization         Address  Phone   Notes  °[place] [facility] [address] °[place], [place] [address] (336) 641-8030 --Must be a resident of [place] °-- Must have NO insurance coverage whatsoever (no Medicaid/ Medicare, etc.) °-- The pt. MUST have a primary care doctor that directs their care regularly and follows them in the community °  °[facility]  (866) 331-1348   °[employer]  (888) 892-1162   ° °Agencies that provide inexpensive medical care: °Organization         Address  Phone   Notes  °[facility]  (336) 832-8035   °[facility]    (336) 832-7272   °[facility] [address] °[place], [place] [address] (336) 832-4777   °[facility] [address], °[place] (336) 271-4999   °[facility]    (336) 373-0678   °[facility]    (336) 272-1050   °[facility] ° [address], [place] Phone:  (336) 832-4444, Fax:  (336) 832-4440 Hours of Operation:  9 am - 6 pm, M-F.  Also accepts Medicaid/Medicare and self-pay.  °[facility] ° [address], [place] Phone: (336) [phone], Fax: (336) [phone]. Hours of Operation:  8:30 am - 5:30 pm, M-F.  Also accepts Medicaid and self-pay.  °[facility] [place] [address], [place] Phone: (336) 878-6027   °[facility] [address], [place], [place] (336)723-1848, Ext. 123 Mondays & Thursdays: 7-9 AM.  First 15 patients are seen on a first come, first serve basis. °  ° °Medicaid-accepting [place] Providers: ° °Organization         Address  Phone   Notes  °[facility] [address], [place] (336) [phone] Also accepts self-pay patients.  °[facility] [address], [place] ° (336) 856-9996   °[facility] [address], [place]  (336) 288-8857   °[facility] [address] [place] [address], [place] (336) 299-7000   °Veita Bland [address], [place]  ° (336) 373-1557 Only accepts [place] Access Medicaid patients after they have their name applied to their card.  ° °Self-Pay (no insurance) in [place]: ° °Organization         Address  Phone   Notes  °Sickle Cell Patients, [facility] [address], [place] (336) 832-1970   °[facility] [address], [place] (336) 832-4400   °[facility] ° [address], [place] (336) 992-4800   °[facility]/Dr. Osei-Bonsu ° [address] [place] [address], [place] or [address], [place] (336) [phone] Phone number for both [place] and [place] locations is the same.  °[facility] [address], [place] (336) 299-0000   °[facility] [address] [place] [address], [place] or [address] (336) 852-7530 °(336) 878-2260   °[facility] [address], [place] (336) 350-1642, phone; (336) 294-5005, fax Sees patients 1st and 3rd Saturday of every month.  Must not qualify for public or private insurance (i.e. Medicaid, Medicare, [place] Health Choice, Veterans' Benefits) • Household income should be no more than 200% of the poverty level •The clinic cannot treat you if you are pregnant or think you are pregnant • Sexually transmitted diseases are not treated at the clinic.  ° ° °Dental Care: °Organization         Address  Phone  Notes  °[place] [facility] [address], [place] (336) 641-6152 Accepts children up to age 21 who are enrolled in Medicaid or [place] Health Choice; pregnant women with a Medicaid card; and children who have applied for Medicaid or [place] Health Choice, but were declined, whose parents can pay a reduced fee at time of service.  °[place]  [facility] [place]  [address], [place] (336) 641-7733 Accepts children up to age 21 who are enrolled in Medicaid or [place] Health Choice; pregnant women with a Medicaid card; and children who have applied for Medicaid or [place] Health Choice, but were declined, whose parents can pay a reduced fee at time of service.  °[facility] ° [address], [place] (336) 641-4533 Patients are seen by appointment only. Walk-ins are not accepted. [facility] will see patients 18 years of age and older. °Monday - Tuesday (8am-5pm) °Most Wednesdays (8:30-5pm) °$30 per visit, cash only  °[facility] ° [address], [place] (336) 641-4533 Patients are seen by appointment only. Walk-ins are not accepted. [facility] will see patients 18 years of age and older. °One   Wednesday Evening (Monthly: Volunteer Based).  $30 per visit, cash only  °[facility]  (919) [phone] for adults; Children under age 4, call Graduate Pediatric Dentistry at ([phone]) [phone]. Children aged 4-14, please call ([phone]) [phone] to request a pediatric application. ° Dental services are provided in all areas of dental care including fillings, crowns and bridges, complete and partial dentures, implants, gum treatment, root canals, and extractions. Preventive care is also provided. Treatment is provided to both adults and children. °Patients are selected via a lottery and there is often a waiting list. °  °[facility] [address], °[place] ° (336) 763-8833 [email or web address] °  °[facility] [address], [place], [place] (336)723-1848, Ext. 123 Second and Fourth Thursday of each month, opens at 6:30 AM; Clinic ends at 9 AM.  Patients are seen on a first-come first-served basis, and a limited number are seen during each clinic.  ° °[facility] ° [address], [place], [place] (336) 723-7904    Eligibility Requirements °You must have lived in [place], [place], or [place] counties for at least the last three months. °  You cannot be eligible for state or federal sponsored healthcare insurance, including Veterans Administration, Medicaid, or Medicare. °  You generally cannot be eligible for healthcare insurance through your employer.  °  How to apply: °Eligibility screenings are held every Tuesday and Wednesday afternoon from 1:00 pm until 4:00 pm. You do not need an appointment for the interview!  °[facility] [address], [place], [place] 336-631-2330   °[facility]  336-342-8273   °[facility]  336-703-3100   °[facility]  336-570-6415   ° °Behavioral Health Resources in the Community: °Intensive Outpatient Programs °Organization         Address  Phone  Notes  °[facility] [address], [place], [place] 336-878-6098   °[facility] [address], [place], [place] 336-832-9800   °[facility] [address], [place], [place] ° 336-882-2125   °[facility] [address],  °[place], [place] 1-800-853-5163 or 336-641-4981   °Substance Abuse Resources °Organization         Address  Phone  Notes  °[facility]  336-882-2125   °[facility]  336-784-9470   °[facility]  336-285-9073   °[facility]  336-845-3988   °Residential & Outpatient Substance Abuse Program  1-800-659-3381   °Psychological Services °Organization         Address  Phone  Notes  °[facility]  336- 832-9600   °[facility]  336- 378-7881   °[facility] [address], [place] 1-800-853-5163 or 336-641-4981   ° °Mobile Crisis Teams °Organization         Address  Phone  Notes  °[facility], Mobile Crisis Care Unit  1-877-626-1772   °Assertive °[facility] ° [address]  [place], [place] 336-834-9664   °Sharon DeEsch [address] °[place] 336-554-5454   ° °Self-Help/Support Groups °Organization         Address  Phone             Notes  °[facility]  - variety of support groups  336- 373-1402 Call for more information  °Narcotics Anonymous (NA), [facility] [address], °[place]  2 meetings at this location  ° °  Residential Treatment Programs Organization         Address  Phone  Notes  [facility] [address],    [place]  2-778-242-3536   [facility]  [address] [place] [address], [place], [place] 400-867-6195   [facility] [address] [place], [place] 093-267-1245 Admissions: 8am-3pm M-F  [facility] [address]    [place], [place] 809-983-3825   [facility] [address] [place], [place], [place] 053-976-7341   [facility] [address]  [place], [place] 937-902-4097   [facility] - Intensive Outpatient [address], [place], [place] 353-299-2426   [facility] ([facility] [address] [place]  [place], [place] 8-341-962-2297 or 857-284-0819   [facility] ([facility] [address] [place], [place] 408-144-8185 Accepts Medicaid  [facility] [place] [address],  [place] 6-314-970-2637 Substance Abuse/Addiction Treatment   [facility]         Address  Phone  Notes  [facility]  816-667-3794   Angie Fava, PhD [address] [place], [place]   949-686-6436 or 773-423-1454   [facility]   [address] [place], [place] (973) 699-9345   [facility] [address], [place], [place] 817-777-5656 Insurance/Medicaid/sponsorship through [facility] [address]                                    [place], [place] (662)[phone] Therapy/tele-psych/case    [facility] [address], [place] [phone]    Dr. Lolly Mustache  (231)810-0761   [facility]  [employer] [facility]. 1) [address],  2) [address] [place] 3)  [address], [place] [phone]  [phone]   [facility] Child Abuse Hotline 419-540-3441 or 607-134-0063 (After Hours)      Take the prescriptions as directed.  Use your albuterol inhaler (2 to 4 puffs) or your albuterol nebulizer (1 unit dose) every 4 hours for the next 7 days, then as needed for cough, wheezing, or shortness of breath.  Call your regular medical doctor today to schedule a follow up appointment within the next 3 days.  Return to the Emergency Department immediately sooner if worsening.

## 2014-04-15 NOTE — ED Provider Notes (Signed)
CSN: [id number]     Arrival date & time 04/15/14  0315 History   First MD Initiated Contact with Patient 04/15/14 [phone]     Chief Complaint  Patient presents with  . Shortness of Breath  . URI      HPI Pt was seen at 0710.  Per pt, c/o gradual onset and persistence of constant runny/stuffy nose, sinus congestion, and cough for the past 1 week. Has been associated with SOB and constant left sided mid-back "pain." Describes her cough as productive of "green" sputum. States she used her albuterol MDI at home without change in her symptoms. Pt states she has run out of her neb solution and is requesting a refill. Denies fevers, no rash, no CP/palpitations, no N/V/D, no abd pain.    Past Medical History  Diagnosis Date  . Emphysema   . Asthma   . Polysubstance abuse 2011    [facility] day [facility] admission for this. Cocaine, ETOH, MJA,   . Anxiety 2011  . Hemorrhoids   . Arthritis   . Tobacco use    Past Surgical History  Procedure Laterality Date  . Incision and drainage  12/2009    of thrombosed internal hemorrhoid  . Colonoscopy N/A 08/07/2013    Procedure: COLONOSCOPY;  Surgeon: Iva Boop, MD;  Location: [facility] ENDOSCOPY;  Service: Endoscopy;  Laterality: N/A;  . Tubal ligation     Family History  Problem Relation Age of Onset  . Emphysema Father   . Asthma Sister   . Lung cancer Father     was a smoker  . Heart disease Father   . Uterine cancer Mother   . Prostate cancer Father    History  Substance Use Topics  . Smoking status: Current Every Day Smoker -- 0.50 packs/day for 50 years    Types: Cigarettes  . Smokeless tobacco: Never Used  . Alcohol Use: Yes     Comment: States she will drink a 40 oz beer of a pint of liquor over a weekend.    Review of Systems ROS: Statement: All systems negative except as marked or noted in the HPI; Constitutional: Negative for fever and chills. ; ; Eyes: Negative for eye pain, redness and discharge. ; ; ENMT: Negative for ear pain,  hoarseness, sore throat. +nasal congestion, sinus pressure and rhinorrhea. ; ; Cardiovascular: Negative for chest pain, palpitations, diaphoresis, and peripheral edema. ; ; Respiratory: +SOB, cough. Negative for wheezing and stridor. ; ; Gastrointestinal: Negative for nausea, vomiting, diarrhea, abdominal pain, blood in stool, hematemesis, jaundice and rectal bleeding. . ; ; Genitourinary: Negative for dysuria, flank pain and hematuria. ; ; Musculoskeletal: Negative for back pain and neck pain. Negative for swelling and trauma.; ; Skin: Negative for pruritus, rash, abrasions, blisters, bruising and skin lesion.; ; Neuro: Negative for headache, lightheadedness and neck stiffness. Negative for weakness, altered level of consciousness , altered mental status, extremity weakness, paresthesias, involuntary movement, seizure and syncope.     Allergies  Lorazepam; Advil; and Vicodin  Home Medications   Prior to Admission medications   Medication Sig Start Date End Date Taking? Authorizing Provider  albuterol (PROVENTIL HFA;VENTOLIN HFA) 108 (90 BASE) MCG/ACT inhaler Inhale 2 puffs into the lungs every 6 (six) hours as needed for wheezing. 09/14/12  Yes Kristen N Ward, DO  albuterol (PROVENTIL) (2.5 MG/3ML) 0.083% nebulizer solution Take 3 mLs (2.5 mg total) by nebulization every 6 (six) hours as needed for wheezing. 11/13/13  Yes Marisa Severin, MD  gabapentin (NEURONTIN)  300 MG capsule Take 300 mg by mouth 2 (two) times daily.   Yes Historical Provider, MD  MELOXICAM PO Take 1 tablet by mouth 2 (two) times daily.   Yes Historical Provider, MD  oxyCODONE-acetaminophen (PERCOCET/ROXICET) 5-325 MG per tablet Take 1 tablet by mouth 3 (three) times daily as needed for severe pain. 08/06/13  Yes Maryann Mikhail, DO  QUEtiapine (SEROQUEL) 300 MG tablet Take 300 mg by mouth at bedtime.   Yes Historical Provider, MD  bisacodyl (DULCOLAX) 5 MG EC tablet Take 1 tablet (5 mg total) by mouth 2 (two) times daily. Patient not  taking: Reported on 04/15/2014 08/07/13   Nita Sells Mikhail, DO  dicyclomine (BENTYL) 20 MG tablet Take 1 tablet (20 mg total) by mouth 4 (four) times daily -  before meals and at bedtime. Patient not taking: Reported on 04/15/2014 08/06/13   [facility] Mikhail, DO  feeding supplement, RESOURCE BREEZE, (RESOURCE BREEZE) LIQD Take 1 Container by mouth 3 (three) times daily between meals. Patient not taking: Reported on 04/15/2014 08/07/13   Nita Sells Mikhail, DO  guaiFENesin-codeine 100-10 MG/5ML syrup Take 5 mLs by mouth every 6 (six) hours as needed for cough. Patient not taking: Reported on 04/15/2014 11/13/13   Marisa Severin, MD  nicotine (NICODERM CQ - DOSED IN MG/24 HOURS) 21 mg/24hr patch Place 1 patch (21 mg total) onto the skin daily. Patient not taking: Reported on 04/15/2014 08/06/13   Nita Sells Mikhail, DO  predniSONE (DELTASONE) 20 MG tablet Take 3 tablets (60 mg total) by mouth daily. Patient not taking: Reported on 04/15/2014 11/13/13   Marisa Severin, MD   BP 127/79 mmHg  Pulse 85  Temp(Src) 98.2 F (36.8 C) (Oral)  Resp 18  Ht 5\' 6"  (1.676 m)  Wt 152 lb 2 oz (69.003 kg)  BMI 24.57 kg/m2  SpO2 97% Physical Exam 0715: Physical examination:  Nursing notes reviewed; Vital signs and O2 SAT reviewed;  Constitutional: Well developed, Well nourished, Well hydrated, In no acute distress; Head:  Normocephalic, atraumatic; Eyes: EOMI, PERRL, No scleral icterus; ENMT: TM's clear bilat. +edemetous nasal turbinates bilat with clear rhinorrhea. Mouth and pharynx normal, Mucous membranes moist; Neck: Supple, Full range of motion, No lymphadenopathy; Cardiovascular: Regular rate and rhythm, No murmur, rub, or gallop; Respiratory: Breath sounds coarse & equal bilaterally, No wheezes.  Speaking full sentences with ease, Normal respiratory effort/excursion; Chest: Nontender, Movement normal; Abdomen: Soft, Nontender, Nondistended, Normal bowel sounds; Genitourinary: No CVA tenderness; Spine:  No midline CS, TS, LS tenderness. No  rash.;; Extremities: Pulses normal, No tenderness, No edema, No calf edema or asymmetry.; Neuro: AA&Ox3, Major CN grossly intact.  Speech clear. No gross focal motor or sensory deficits in extremities. Climbs on and off stretcher easily by herself. Gait steady.; Skin: Color normal, Warm, Dry.; Psych:  Anxious.    ED Course  Procedures     EKG Interpretation None      MDM  MDM Reviewed: previous chart, nursing note and vitals Reviewed previous: labs and ECG Interpretation: labs, ECG and x-ray   ED ECG REPORT   Date: 04/15/2014  Rate: 82  Rhythm: normal sinus rhythm  QRS Axis: normal  Intervals: normal  ST/T Wave abnormalities: normal  Conduction Disutrbances:none  Narrative Interpretation:   Old EKG Reviewed: unchanged; no significant changes from previous EKG dated 11/13/2013.    Results for orders placed or performed during the hospital encounter of 04/15/14  Comprehensive metabolic panel  Result Value Ref Range   Sodium 143 135 - 145 mmol/L   Potassium  4.4 3.5 - 5.1 mmol/L   Chloride 107 96 - 112 mmol/L   CO2 30 19 - 32 mmol/L   Glucose, Bld 91 70 - 99 mg/dL   BUN 7 6 - 23 mg/dL   Creatinine, Ser 8.09 0.50 - 1.10 mg/dL   Calcium 9.3 8.4 - 98.3 mg/dL   Total Protein 6.5 6.0 - 8.3 g/dL   Albumin 3.8 3.5 - 5.2 g/dL   AST 18 0 - 37 U/L   ALT 15 0 - 35 U/L   Alkaline Phosphatase 59 39 - 117 U/L   Total Bilirubin 0.4 0.3 - 1.2 mg/dL   GFR calc non Af Amer 57 (L) >90 mL/min   GFR calc Af Amer 66 (L) >90 mL/min   Anion gap 6 5 - 15  Brain natriuretic peptide (only with dyspnea)  Result Value Ref Range   B Natriuretic Peptide 279.5 (H) 0.0 - 100.0 pg/mL  D-dimer, quantitative  Result Value Ref Range   D-Dimer, Quant 0.54 (H) 0.00 - 0.48 ug/mL-FEU  CBC with Differential  Result Value Ref Range   WBC 4.6 4.0 - 10.5 K/uL   RBC 4.97 3.87 - 5.11 MIL/uL   Hemoglobin 14.0 12.0 - 15.0 g/dL   HCT 38.2 50.5 - 39.7 %   MCV 86.1 78.0 - 100.0 fL   MCH 28.2 26.0 - 34.0 pg    MCHC 32.7 30.0 - 36.0 g/dL   RDW 67.3 41.9 - 37.9 %   Platelets 265 150 - 400 K/uL   Neutrophils Relative % 55 43 - 77 %   Neutro Abs 2.5 1.7 - 7.7 K/uL   Lymphocytes Relative 31 12 - 46 %   Lymphs Abs 1.4 0.7 - 4.0 K/uL   Monocytes Relative 11 3 - 12 %   Monocytes Absolute 0.5 0.1 - 1.0 K/uL   Eosinophils Relative 3 0 - 5 %   Eosinophils Absolute 0.1 0.0 - 0.7 K/uL   Basophils Relative 0 0 - 1 %   Basophils Absolute 0.0 0.0 - 0.1 K/uL  I-stat troponin, ED (not at [facility])  Result Value Ref Range   Troponin i, poc 0.00 0.00 - 0.08 ng/mL   Comment 3           Dg Chest 2 View 04/15/2014   CLINICAL DATA:  Shortness of breath and cough for 6 days.  EXAM: CHEST  2 VIEW  COMPARISON:  PA and lateral chest in a 06/14/2010 and 02/15/2012.  FINDINGS: The lungs are clear. Heart size is normal. There is no pneumothorax or pleural effusion. No focal bony abnormality is identified.  IMPRESSION: Negative exam.   Electronically Signed   By: Drusilla Kanner M.D.   On: 04/15/2014 07:11   Ct Angio Chest Pe W/cm &/or Wo Cm 04/15/2014   CLINICAL DATA:  Shortness of breath at rest. Upper respiratory infection with productive cough. Chest pain extending around to the back. Elevated D-dimer level.  EXAM: CT ANGIOGRAPHY CHEST WITH CONTRAST  TECHNIQUE: Multidetector CT imaging of the chest was performed using the standard protocol during bolus administration of intravenous contrast. Multiplanar CT image reconstructions and MIPs were obtained to evaluate the vascular anatomy.  CONTRAST:  4mL OMNIPAQUE IOHEXOL 350 MG/ML SOLN  COMPARISON:  Multiple exams, including 04/15/2014 chest radiograph  FINDINGS: Mediastinum/Nodes: No filling defect is identified in the pulmonary arterial tree to suggest pulmonary embolus. Mild aortic atherosclerotic calcification without acute aortic findings; the contrast bolus in the aorta is relatively dilute but probably enough to exclude dissection with  reasonable confidence.  Mild cardiomegaly.   No pathologic adenopathy.  Lungs/Pleura: Scarring or subsegmental atelectasis anteriorly in the left lower lobe. Airway thickening is present, suggesting bronchitis or reactive airways disease. Somewhat narrow appearance of the left mainstem bronchus, likely due to a combination of the airway thickening and possible leak mild bronchomalacia.  There is some very faint ground-glass nodularity in the lung apices which is probably inflammatory.  Upper abdomen: Unremarkable  Musculoskeletal: Unremarkable  Review of the MIP images confirms the above findings.  IMPRESSION: 1. No pulmonary embolus or acute aortic finding identified. 2. Airway thickening is present, suggesting bronchitis or reactive airways disease. 3. Cannot exclude mild bronchomalacia given the small luminal caliber of the left mainstem bronchus. 4. Very faint ground-glass nodularity in the lung apices, likely inflammatory. 5. Cardiomegaly.   Electronically Signed   By: Gaylyn Rong M.D.   On: 04/15/2014 10:38    1045:  No PE on CT scan. BNP mildly elevated; no old to compare, however, no overt CHF on CXR or CT chest; will have pt f/u outpt. Pt states she "feels better" after neb and steroid.  NAD, lungs CTA bilat, no wheezing, resps easy, speaking full sentences, Sats 100% R/A.  Pt ambulated around the ED with resps easy, NAD. Pt has tol PO well without N/V. Pt wants to go home now. Tx COPD exacerbation symptomatically at this time. Dx and testing d/w pt.  Questions answered.  Verb understanding, agreeable to d/c home with outpt f/u.    Samuel Jester, DO 04/18/14 1331

## 2015-06-19 DIAGNOSIS — F319 Bipolar disorder, unspecified: Secondary | ICD-10-CM

## 2015-06-19 DIAGNOSIS — M25559 Pain in unspecified hip: Secondary | ICD-10-CM

## 2015-06-19 DIAGNOSIS — R0600 Dyspnea, unspecified: Secondary | ICD-10-CM

## 2015-06-19 DIAGNOSIS — M25561 Pain in right knee: Secondary | ICD-10-CM

## 2015-06-19 DIAGNOSIS — F209 Schizophrenia, unspecified: Secondary | ICD-10-CM

## 2015-06-19 DIAGNOSIS — M25562 Pain in left knee: Secondary | ICD-10-CM

## 2015-06-19 DIAGNOSIS — M542 Cervicalgia: Secondary | ICD-10-CM

## 2015-06-19 DIAGNOSIS — J441 Chronic obstructive pulmonary disease with (acute) exacerbation: Secondary | ICD-10-CM

## 2015-06-19 DIAGNOSIS — G8929 Other chronic pain: Secondary | ICD-10-CM

## 2015-06-19 NOTE — Congregational Nurse Program (Signed)
PATH team worker Tiffany wanted CN to help client fill out her CAP-DA paperwork because client left her glasses.  CN agreed to help client.  Safety check completed, client without suicidal or homicidal ideation,  Client does admit to visual hallucinations, denies tactile or auditory hallucinations, client admits to not taking Seroquel but having a current prescription, client states, "I have but I don't want to take it." Client states, "I dont like the way it makes me feel, so I'm just going to be act crazy."  Client

## 2015-08-11 ENCOUNTER — Institutional Professional Consult (permissible substitution): Payer: Medicaid Other | Admitting: Internal Medicine

## 2015-09-13 ENCOUNTER — Emergency Department (HOSPITAL_COMMUNITY)
Admission: EM | Admit: 2015-09-13 | Discharge: 2015-09-14 | Disposition: A | Discharge status: Other Acute Inpatient Hospital | Payer: Medicare Other | Attending: Emergency Medicine | Admitting: Emergency Medicine

## 2015-09-13 ENCOUNTER — Encounter (HOSPITAL_COMMUNITY): Payer: Self-pay | Admitting: *Deleted

## 2015-09-13 DIAGNOSIS — F10120 Alcohol abuse with intoxication, uncomplicated: Secondary | ICD-10-CM | POA: Insufficient documentation

## 2015-09-13 DIAGNOSIS — J45909 Unspecified asthma, uncomplicated: Secondary | ICD-10-CM | POA: Insufficient documentation

## 2015-09-13 DIAGNOSIS — R45851 Suicidal ideations: Secondary | ICD-10-CM

## 2015-09-13 DIAGNOSIS — F17210 Nicotine dependence, cigarettes, uncomplicated: Secondary | ICD-10-CM | POA: Diagnosis not present

## 2015-09-13 DIAGNOSIS — Z79899 Other long term (current) drug therapy: Secondary | ICD-10-CM | POA: Insufficient documentation

## 2015-09-13 DIAGNOSIS — F1910 Other psychoactive substance abuse, uncomplicated: Secondary | ICD-10-CM | POA: Diagnosis present

## 2015-09-13 LAB — CBC WITH DIFFERENTIAL/PLATELET
Basophils Absolute: 0 10*3/uL (ref 0.0–0.1)
Basophils Relative: 0 %
Eosinophils Absolute: 0.1 10*3/uL (ref 0.0–0.7)
Eosinophils Relative: 1 %
HCT: 46.1 % — ABNORMAL HIGH (ref 36.0–46.0)
HEMOGLOBIN: 14.9 g/dL (ref 12.0–15.0)
LYMPHS ABS: 1.5 10*3/uL (ref 0.7–4.0)
Lymphocytes Relative: 38 %
MCH: 28.2 pg (ref 26.0–34.0)
MCHC: 32.3 g/dL (ref 30.0–36.0)
MCV: 87.1 fL (ref 78.0–100.0)
Monocytes Absolute: 0.3 10*3/uL (ref 0.1–1.0)
Monocytes Relative: 8 %
NEUTROS PCT: 53 %
Neutro Abs: 2.1 10*3/uL (ref 1.7–7.7)
Platelets: 271 10*3/uL (ref 150–400)
RBC: 5.29 MIL/uL — ABNORMAL HIGH (ref 3.87–5.11)
RDW: 13.8 % (ref 11.5–15.5)
WBC: 4 10*3/uL (ref 4.0–10.5)

## 2015-09-13 LAB — RAPID URINE DRUG SCREEN, HOSP PERFORMED
AMPHETAMINES: NOT DETECTED
BARBITURATES: NOT DETECTED
Benzodiazepines: NOT DETECTED
Cocaine: POSITIVE — AB
Opiates: NOT DETECTED
TETRAHYDROCANNABINOL: NOT DETECTED

## 2015-09-13 LAB — BASIC METABOLIC PANEL
Anion gap: 17 — ABNORMAL HIGH (ref 5–15)
BUN: 10 mg/dL (ref 6–20)
CHLORIDE: 103 mmol/L (ref 101–111)
CO2: 20 mmol/L — ABNORMAL LOW (ref 22–32)
CREATININE: 0.86 mg/dL (ref 0.44–1.00)
Calcium: 9 mg/dL (ref 8.9–10.3)
GFR calc Af Amer: >60 mL/min (ref >60)
GFR calc non Af Amer: >60 mL/min (ref >60)
GLUCOSE: 95 mg/dL (ref 65–99)
Potassium: 3.5 mmol/L (ref 3.5–5.1)
Sodium: 140 mmol/L (ref 135–145)

## 2015-09-13 LAB — ETHANOL: ALCOHOL ETHYL (B): 20 mg/dL — AB (ref <5)

## 2015-09-13 MED ORDER — LORAZEPAM 2 MG/ML IJ SOLN
2.0000 mg | Freq: Once | INTRAMUSCULAR | Status: AC
  Administered 2015-09-13: 2 mg via INTRAVENOUS

## 2015-09-13 MED ORDER — LORAZEPAM 2 MG/ML IJ SOLN
INTRAMUSCULAR | Status: AC
  Filled 2015-09-13: qty 1

## 2015-09-13 MED ORDER — ZIPRASIDONE MESYLATE 20 MG IM SOLR
20.0000 mg | Freq: Once | INTRAMUSCULAR | Status: AC
  Administered 2015-09-13: 20 mg via INTRAMUSCULAR

## 2015-09-13 MED ORDER — GABAPENTIN 300 MG PO CAPS
300.0000 mg | ORAL_CAPSULE | Freq: Two times a day (BID) | ORAL | Status: DC
  Administered 2015-09-14 (×2): 300 mg via ORAL
  Filled 2015-09-13 (×2): qty 1

## 2015-09-13 MED ORDER — MELOXICAM 7.5 MG PO TABS
15.0000 mg | ORAL_TABLET | Freq: Two times a day (BID) | ORAL | Status: DC
Start: 2015-09-14 — End: 2015-09-14
  Administered 2015-09-14 (×2): 15 mg via ORAL
  Filled 2015-09-13 (×4): qty 2

## 2015-09-13 NOTE — ED Triage Notes (Signed)
Pt to ED by [facility] after husband called for concerns of drug use. When EMS arrived, pt was asleep on the couch, alert, but unable to answer questions. Pt combative enroute after thinking she was going to jail for drug use. EMS placed pt in restraints. Pt admits to cocaine/crack, xanax, and ETOH use tonight. Pt denies SI/HI

## 2015-09-13 NOTE — ED Notes (Signed)
Pt urinated in a bed pan.

## 2015-09-13 NOTE — ED Notes (Signed)
Pt moved to room 13 sitter at bedside , pt sleeping at this time

## 2015-09-13 NOTE — ED Notes (Signed)
Pt found, by EMT, standing on the bed with pulse ox cord wrapped several times around her neck at 0358. Pt assisted to bed, EMT attempted to remove cord, and pt pulled the cord tighter around neck. After removing cord, pt had redness to neck. Pt remained alert and responsive following episode. Dr.Horton at bedside. ~54mins following episode, pt became aggressive, combative, and attempting to leave. Pt assisted to bed after being aggressive and attempting to kick staff. Pt given IM medications and placed in 4point restraints.

## 2015-09-13 NOTE — ED Notes (Signed)
Pt requesting to leave AMA. Dr.Horton at bedside.

## 2015-09-13 NOTE — ED Notes (Addendum)
Pt given sprite with ice and cranberry juice with ice, per Marylene Land, RN.  A meal tray has been ordered by Amy, RN.

## 2015-09-13 NOTE — ED Notes (Addendum)
At times, pt is tearful, requesting help for detox. At other times, pt verbally aggressive and combative to staff. Pt denied hx of bipolar or other mental health disorders. Pt admits to snorting xanax and percocets at home. Reports having chronic neck pain and "swallowing the pills takes too long to help my pain." Pt also reports that boyfriend and grandchildren are wanting pt to receive detox from drugs.

## 2015-09-13 NOTE — ED Notes (Signed)
Pt given heating pack to place on hands for arthritis.

## 2015-09-13 NOTE — ED Notes (Signed)
Pt called out and stated "I want my mobic and gabapentin" Dr Effie Shy notified about daily meds and Dr Effie Shy stated that he will look into it.

## 2015-09-13 NOTE — ED Notes (Signed)
TTS in room speaking with pt

## 2015-09-13 NOTE — ED Notes (Signed)
Bilateral wrist restraints removed. CMS intact

## 2015-09-13 NOTE — ED Provider Notes (Signed)
[facility]-EMERGENCY DEPT Provider Note   CSN: [id number] Arrival date & time: 09/13/15  4944  By signing my name below, I, Christel Mormon, attest that this documentation has been prepared under the direction and in the presence of Shon Baton, MD . Electronically Signed: Christel Mormon, Scribe. 09/13/2015. 3:27 AM.   History   Chief Complaint Chief Complaint  Patient presents with  . Drug Overdose    The history is provided by the EMS personnel. No language interpreter was used.   HPI Comments:  Dawn Lane is a 65 y.o. female with a history of polysubstance abuse  who presents to the Emergency Department via EMS, complaining of after husband called for concerns of drug use. When EMS arrived, pt was asleep on the couch, alert, but unable to answer questions. Pt combative enroute after thinking she was going to jail for drug use. EMS placed pt in restraints. Pt is a daily alcohol user. Pt admits to crack, xanax, and ETOH use tonight. Pt admits to snorting her medications.Patient reports that she would like to leave. She states that she does "not one of my pills taken away." She denies SI or HI but does make vague statements of "sometimes be better if I just went away." She reports that her family gets very frustrated with her but "I'm not willing to change."   Past Medical History:  Diagnosis Date  . Anxiety 2011  . Arthritis   . Asthma   . Emphysema   . Hemorrhoids   . Polysubstance abuse 2011   [facility] day [facility] admission for this. Cocaine, ETOH, MJA,   . Tobacco use     Patient Active Problem List   Diagnosis Date Noted  . Colitis 08/04/2013  . Abdominal pain 08/04/2013  . Nausea, vomiting and diarrhea 08/04/2013  . Tobacco abuse 08/04/2013  . Dyspnea 08/02/2010    Past Surgical History:  Procedure Laterality Date  . COLONOSCOPY N/A 08/07/2013   Procedure: COLONOSCOPY;  Surgeon: Iva Boop, MD;  Location: [facility] ENDOSCOPY;  Service: Endoscopy;  Laterality: N/A;  .  INCISION AND DRAINAGE  12/2009   of thrombosed internal hemorrhoid  . TUBAL LIGATION      OB History    No data available       Home Medications    Prior to Admission medications   Medication Sig Start Date End Date Taking? Authorizing Provider  albuterol (PROVENTIL HFA;VENTOLIN HFA) 108 (90 BASE) MCG/ACT inhaler Inhale 2 puffs into the lungs every 6 (six) hours as needed for wheezing. 09/14/12   Kristen N Ward, DO  albuterol (PROVENTIL) (2.5 MG/3ML) 0.083% nebulizer solution Take 3 mLs (2.5 mg total) by nebulization every 4 (four) hours as needed for wheezing or shortness of breath. 04/15/14   Samuel Jester, DO  bisacodyl (DULCOLAX) 5 MG EC tablet Take 1 tablet (5 mg total) by mouth 2 (two) times daily. 08/07/13   Maryann Mikhail, DO  dicyclomine (BENTYL) 20 MG tablet Take 1 tablet (20 mg total) by mouth 4 (four) times daily -  before meals and at bedtime. Patient not taking: Reported on 04/15/2014 08/06/13   Nita Sells Mikhail, DO  doxycycline (VIBRA-TABS) 100 MG tablet Take 1 tablet (100 mg total) by mouth 2 (two) times daily. Patient not taking: Reported on 06/19/2015 04/15/14   Samuel Jester, DO  feeding supplement, RESOURCE BREEZE, (RESOURCE BREEZE) LIQD Take 1 Container by mouth 3 (three) times daily between meals. Patient not taking: Reported on 04/15/2014 08/07/13   Edsel Petrin, DO  gabapentin (  NEURONTIN) 300 MG capsule Take 300 mg by mouth 2 (two) times daily.    Historical Provider, MD  guaiFENesin-codeine 100-10 MG/5ML syrup Take 5 mLs by mouth every 6 (six) hours as needed for cough. Patient not taking: Reported on 04/15/2014 11/13/13   Marisa Severin, MD  meloxicam (MOBIC) 15 MG tablet Take 15 mg by mouth 2 (two) times daily.    Historical Provider, MD  nicotine (NICODERM CQ - DOSED IN MG/24 HOURS) 21 mg/24hr patch Place 1 patch (21 mg total) onto the skin daily. Patient not taking: Reported on 04/15/2014 08/06/13   Nita Sells Mikhail, DO  oxyCODONE-acetaminophen (PERCOCET/ROXICET) 5-325 MG per  tablet Take 1 tablet by mouth 3 (three) times daily as needed for severe pain. 08/06/13   Maryann Mikhail, DO  predniSONE (DELTASONE) 20 MG tablet Take 2 tablets (40 mg total) by mouth daily. 04/15/14   Samuel Jester, DO  QUEtiapine (SEROQUEL) 300 MG tablet Take 300 mg by mouth at bedtime.    Historical Provider, MD    Family History Family History  Problem Relation Age of Onset  . Emphysema Father   . Lung cancer Father     was a smoker  . Heart disease Father   . Prostate cancer Father   . Asthma Sister   . Uterine cancer Mother     Social History Social History  Substance Use Topics  . Smoking status: Current Every Day Smoker    Packs/day: 0.50    Years: 50.00    Types: Cigarettes  . Smokeless tobacco: Never Used  . Alcohol use Yes     Comment: States she will drink a 40 oz beer of a pint of liquor over a weekend.     Allergies   Lorazepam; Advil [ibuprofen]; and Vicodin [hydrocodone-acetaminophen]   Review of Systems Review of Systems  Respiratory: Negative for shortness of breath.   Cardiovascular: Negative for chest pain.  Psychiatric/Behavioral: Positive for agitation, self-injury and suicidal ideas.  All other systems reviewed and are negative.    Physical Exam Updated Vital Signs BP (!) 88/64 (BP Location: Right Arm)   Pulse 86   Resp 18   SpO2 97%   Physical Exam  Constitutional: She is oriented to person, place, and time.  Disheveled, no acute distress, hyper active  HENT:  Head: Normocephalic and atraumatic.  Poor dentition  Eyes: Pupils are equal, round, and reactive to light.  Cardiovascular: Normal rate, regular rhythm and normal heart sounds.   Pulmonary/Chest: Effort normal and breath sounds normal. No respiratory distress. She has no wheezes.  Abdominal: Soft. There is no tenderness.  Neurological: She is alert and oriented to person, place, and time.  Skin: Skin is warm and dry.  Psychiatric:  Waxing and waning agitation  Nursing note  and vitals reviewed.    ED Treatments / Results  DIAGNOSTIC STUDIES:  Oxygen Saturation is 97% on RA, normal by my interpretation.    COORDINATION OF CARE:  3:27 AM Discussed treatment plan with pt at bedside and pt agreed to plan.  Labs (all labs ordered are listed, but only abnormal results are displayed) Labs Reviewed  CBC WITH DIFFERENTIAL/PLATELET - Abnormal; Notable for the following:       Result Value   RBC 5.29 (*)    HCT 46.1 (*)    All other components within normal limits  URINE RAPID DRUG SCREEN, HOSP PERFORMED - Abnormal; Notable for the following:    Cocaine POSITIVE (*)    All other components within normal limits  BASIC METABOLIC PANEL  ETHANOL    EKG  EKG Interpretation  Date/Time:  Wednesday September 13 2015 03:14:24 EDT Ventricular Rate:  88 PR Interval:    QRS Duration: 82 QT Interval:  362 QTC Calculation: 438 R Axis:   -135 Text Interpretation:  Sinus rhythm Paired ventricular premature complexes Aberrant conduction of SV complex(es) LAE, consider biatrial enlargement Markedly posterior QRS axis Abnormal T, consider ischemia, lateral leads Baseline wander in lead(s) V5 Confirmed by Sumedh Shinsato  MD, Toni Amend ([id number]) on 09/13/2015 7:33:47 AM       Radiology No results found.  Procedures Procedures (including critical care time)  Medications Ordered in ED Medications  ziprasidone (GEODON) injection 20 mg (20 mg Intramuscular Given 09/13/15 0428)  LORazepam (ATIVAN) injection 2 mg (2 mg Intravenous Given 09/13/15 0440)     Initial Impression / Assessment and Plan / ED Course  I have reviewed the triage vital signs and the nursing notes.  Pertinent labs & imaging results that were available during my care of the patient were reviewed by me and considered in my medical decision making (see chart for details).  Clinical Course  Comment By Time  Yelling heard from the hallway. Patient was found by tach with monitor cords wrapped around her neck. She  is tearful. No ligature marks noted. Vital signs reassuring. IVC paperwork filled out. Patient is now on suicide watch. She will not answer questions at this time. Shon Baton, MD 09/06 0400    3:58AM Pt put a cord around her neck and will now have a sitter.  7:49 AM Restrainted, given Geodon, ativan    Patient presents initially with intoxication. Endorses snorting Xanax, cocaine, and combining this with alcohol. She is otherwise nontoxic. She appears to have capacity and she is oriented 3. I initially offered the patient resources and evaluation for detox. However, patient subsequently took the monitor cords and rectum around her neck. I interpreted this as a suicidal gesture. She was involuntarily Committed. Lab work sent. Patient was very combative and continued to state that she wanted to get home" this will work for me." She received Geodon and Ativan. She is now resting comfortably. She will need to metabolize. Lab work is still pending. When medically cleared, she will need TTS evaluation for her suicidal gesture and polysubstance abuse.  Final Clinical Impressions(s) / ED Diagnoses   Final diagnoses:  None    New Prescriptions New Prescriptions   No medications on file   I personally performed the services described in this documentation, which was scribed in my presence. The recorded information has been reviewed and is accurate.    Shon Baton, MD 09/13/15 [phone]

## 2015-09-13 NOTE — ED Notes (Signed)
Pt reports her boyfriend called EMS because he wants her to get detox. Pt initially refusing detox and wanting to leave. After speaking to MD, pt reconsidering detox options.

## 2015-09-13 NOTE — ED Notes (Signed)
Ordered diet tray 

## 2015-09-13 NOTE — ED Notes (Signed)
Pt assisited up to void in bedpan on chair and then she went back to bed, she did ask where she was and told [facility] was [facility]

## 2015-09-13 NOTE — ED Notes (Signed)
This EMT was bringing a different patient to a room. I turned my head to look at her to see if she was still laying in the bed. Pt at time standing on bed with Sp02 cord wrapped around her neck hanging from the LED surgical lighting that is on top of the bed. I run in while yelling "no" repeately and attempted to turn on the emergency call (but failed) and jump on the bed and try to get the patient from hanging herself. Pt wrapped cord tighter around neck. I was able to get it loosened enough to push her onto her back on the bed. ED staff runs in at this time and helps restrain the patient down to the bed and get the cord from around  her neck. Pt hysterical during all this.

## 2015-09-13 NOTE — ED Notes (Signed)
Meal tray arrived to room. Pt eating dinner.

## 2015-09-13 NOTE — ED Notes (Signed)
Restraints removed from ankles. CMS intact.

## 2015-09-13 NOTE — Progress Notes (Signed)
Patient has been recommended inpatient treatment on 9/06, per T. Starkes NP.  Patient has been referred to the following facilities: [facility] (Geriatric unit) [facility]. [facility]  CSW will continue to seek placement.  Melbourne Abts, LCSWA Disposition staff 09/13/2015 11:08 PM

## 2015-09-13 NOTE — ED Notes (Signed)
EMT Holly called out for EMT Amy to check on this patient because "she's getting out of the bed". EMT Amy and I go into the room to check on pt. Pt at foot of bed trying to get out. Pt stating she needed to urinate. Amy and I get pt on bedpan. Pt while urinating starts making suicidal comments saying that she is tired and is going to kill herself and end it all that it would be better on everyone. Pt told to not think like that or speak that way and to try and just sleep and relax. Pt relaxes back into bed. Amy and I walk out the room and open the trauma room curtains wide open  (Amy to empty bedpan, and I go to speak with pt nurse). Nurse informed about the witnessed comments made by patient. Nurse responded that she was aware. This EMT goes back to designated pod.
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
## 2015-09-13 NOTE — ED Notes (Signed)
TTS complete 

## 2015-09-14 DIAGNOSIS — F10120 Alcohol abuse with intoxication, uncomplicated: Secondary | ICD-10-CM | POA: Diagnosis not present

## 2015-09-14 MED ORDER — ALPRAZOLAM 0.5 MG PO TABS: 0.5000 mg | ORAL_TABLET | Freq: Three times a day (TID) | ORAL | Status: DC | PRN

## 2015-09-14 MED ORDER — DOCUSATE SODIUM 100 MG PO CAPS: 100.0000 mg | ORAL_CAPSULE | Freq: Every day | ORAL | Status: DC | PRN

## 2015-09-14 MED ORDER — NICOTINE 21 MG/24HR TD PT24
21.0000 mg | MEDICATED_PATCH | Freq: Every day | TRANSDERMAL | Status: DC
  Administered 2015-09-14 (×2): 21 mg via TRANSDERMAL
  Filled 2015-09-14 (×2): qty 1

## 2015-09-14 MED ORDER — DIAZEPAM 5 MG PO TABS
5.0000 mg | ORAL_TABLET | Freq: Three times a day (TID) | ORAL | Status: DC | PRN
  Filled 2015-09-14 (×2): qty 1

## 2015-09-14 NOTE — ED Notes (Signed)
Pt goes to shower then returns down hallway wrapped in towel. Pt informed she needs to be clothed in hallway.

## 2015-09-14 NOTE — Progress Notes (Signed)
Pt accepted to [facility] by Dr. Estill Cotta, report number [id number]. Can arrive anytime per Minerva Areola in intake. Informed [facility].  Ilean Skill, MSW, LCSW Clinical Social Work, Disposition  09/14/2015 [phone]

## 2015-09-14 NOTE — ED Notes (Signed)
Patient was given a snack and drink, and A regular diet was ordered for Lunch. 

## 2015-09-14 NOTE — ED Notes (Signed)
Pt out with [facility] ambulatory and stable.

## 2015-09-14 NOTE — ED Notes (Signed)
Pt on phone with family  

## 2015-09-14 NOTE — ED Notes (Signed)
Pt requesting medication for arthritis. Informed pt that meds are listed and coming from pharmacy.

## 2015-09-14 NOTE — ED Notes (Signed)
Pt now sleeping, Sitter remains at bedside

## 2015-09-14 NOTE — ED Notes (Addendum)
[facility] contacted for transport and states it will be "later this afternoon "before transport available.

## 2015-09-14 NOTE — ED Notes (Signed)
Pt on phone talking with family and states I will break the fuck out of this motherfucker. Pt cursing repeatedly and speaks about becoming violent with staff.

## 2015-09-14 NOTE — ED Notes (Signed)
Pt informed she will be leaving for facility soon. States "This is some fucked shit." Attempting to call family

## 2015-11-13 ENCOUNTER — Emergency Department (HOSPITAL_COMMUNITY)
Admission: EM | Admit: 2015-11-13 | Discharge: 2015-11-13 | Disposition: A | Discharge status: Home / Self Care | Payer: Medicare Other | Attending: Emergency Medicine | Admitting: Emergency Medicine

## 2015-11-13 ENCOUNTER — Encounter (HOSPITAL_COMMUNITY): Payer: Self-pay

## 2015-11-13 DIAGNOSIS — F17210 Nicotine dependence, cigarettes, uncomplicated: Secondary | ICD-10-CM | POA: Insufficient documentation

## 2015-11-13 DIAGNOSIS — J45901 Unspecified asthma with (acute) exacerbation: Secondary | ICD-10-CM | POA: Insufficient documentation

## 2015-11-13 DIAGNOSIS — J45909 Unspecified asthma, uncomplicated: Secondary | ICD-10-CM | POA: Diagnosis present

## 2015-11-13 DIAGNOSIS — Z79899 Other long term (current) drug therapy: Secondary | ICD-10-CM | POA: Diagnosis not present

## 2015-11-13 MED ORDER — PREDNISONE 20 MG PO TABS
60.0000 mg | ORAL_TABLET | Freq: Once | ORAL | Status: AC
  Administered 2015-11-13: 60 mg via ORAL
  Filled 2015-11-13: qty 3

## 2015-11-13 MED ORDER — PREDNISONE 20 MG PO TABS: 40.0000 mg | ORAL_TABLET | Freq: Every day | ORAL | 0 refills | Status: DC

## 2015-11-13 MED ORDER — ALBUTEROL SULFATE (2.5 MG/3ML) 0.083% IN NEBU
5.0000 mg | INHALATION_SOLUTION | Freq: Once | RESPIRATORY_TRACT | Status: AC
  Administered 2015-11-13: 5 mg via RESPIRATORY_TRACT
  Filled 2015-11-13: qty 6

## 2015-11-13 MED ORDER — ALBUTEROL SULFATE HFA 108 (90 BASE) MCG/ACT IN AERS
1.0000 | INHALATION_SPRAY | RESPIRATORY_TRACT | Status: DC | PRN
  Administered 2015-11-13: 1 via RESPIRATORY_TRACT
  Filled 2015-11-13: qty 6.7

## 2015-11-13 MED ORDER — AEROCHAMBER PLUS FLO-VU MEDIUM MISC
1.0000 | Freq: Once | Status: AC
  Administered 2015-11-13: 1
  Filled 2015-11-13: qty 1

## 2015-11-13 NOTE — ED Provider Notes (Signed)
[facility]-EMERGENCY DEPT Provider Note   CSN: [id number] Arrival date & time: 11/13/15  8329     History   Chief Complaint Chief Complaint  Patient presents with  . Asthma    HPI Dawn Lane is a 65 y.o. female.  Patient presents to the emergency department with chief complaint of asthma exacerbation. She states that she has a long history of asthma. States that she ran out of her nebulizer solution over the weekend and has had progressively worsening shortness of breath and wheezing. She states that her chest feels tight. She states this feels like her typical asthma exacerbation. She has appointment with her primary care doctor this afternoon, and asks if she can get a breathing treatment to hold her off until that appointment. She denies any fever, chills, or productive cough. She denies any chest pain, but does report chest tightness which she associates with her typical asthma exacerbations. Her symptoms are normally relieved with breathing treatment, which she has been out of. She denies any other associated symptoms.   The history is provided by the patient. No language interpreter was used.    Past Medical History:  Diagnosis Date  . Anxiety 2011  . Arthritis   . Asthma   . Emphysema   . Hemorrhoids   . Polysubstance abuse 2011   [facility] admission for this. Cocaine, ETOH, MJA,   . Tobacco use     Patient Active Problem List   Diagnosis Date Noted  . Colitis 08/04/2013  . Abdominal pain 08/04/2013  . Nausea, vomiting and diarrhea 08/04/2013  . Tobacco abuse 08/04/2013  . Dyspnea 08/02/2010    Past Surgical History:  Procedure Laterality Date  . COLONOSCOPY N/A 08/07/2013   Procedure: COLONOSCOPY;  Surgeon: Iva Boop, MD;  Location: [facility] ENDOSCOPY;  Service: Endoscopy;  Laterality: N/A;  . INCISION AND DRAINAGE  12/2009   of thrombosed internal hemorrhoid  . TUBAL LIGATION      OB History    No data available       Home Medications    Prior  to Admission medications   Medication Sig Start Date End Date Taking? Authorizing Provider  albuterol (PROVENTIL HFA;VENTOLIN HFA) 108 (90 BASE) MCG/ACT inhaler Inhale 2 puffs into the lungs every 6 (six) hours as needed for wheezing. 09/14/12   Kristen N Ward, DO  albuterol (PROVENTIL) (2.5 MG/3ML) 0.083% nebulizer solution Take 3 mLs (2.5 mg total) by nebulization every 4 (four) hours as needed for wheezing or shortness of breath. 04/15/14   Samuel Jester, DO  bisacodyl (DULCOLAX) 5 MG EC tablet Take 1 tablet (5 mg total) by mouth 2 (two) times daily. 08/07/13   Maryann Mikhail, DO  dicyclomine (BENTYL) 20 MG tablet Take 1 tablet (20 mg total) by mouth 4 (four) times daily -  before meals and at bedtime. Patient not taking: Reported on 04/15/2014 08/06/13   Nita Sells Mikhail, DO  doxycycline (VIBRA-TABS) 100 MG tablet Take 1 tablet (100 mg total) by mouth 2 (two) times daily. Patient not taking: Reported on 06/19/2015 04/15/14   Samuel Jester, DO  feeding supplement, RESOURCE BREEZE, (RESOURCE BREEZE) LIQD Take 1 Container by mouth 3 (three) times daily between meals. 08/07/13   Maryann Mikhail, DO  gabapentin (NEURONTIN) 300 MG capsule Take 300 mg by mouth 2 (two) times daily.    Historical Provider, MD  guaiFENesin-codeine 100-10 MG/5ML syrup Take 5 mLs by mouth every 6 (six) hours as needed for cough. Patient not taking: Reported on 04/15/2014 11/13/13  Marisa Severin, MD  meloxicam (MOBIC) 15 MG tablet Take 15 mg by mouth 2 (two) times daily.    Historical Provider, MD  nicotine (NICODERM CQ - DOSED IN MG/24 HOURS) 21 mg/24hr patch Place 1 patch (21 mg total) onto the skin daily. Patient not taking: Reported on 04/15/2014 08/06/13   Nita Sells Mikhail, DO  oxyCODONE-acetaminophen (PERCOCET/ROXICET) 5-325 MG per tablet Take 1 tablet by mouth 3 (three) times daily as needed for severe pain. 08/06/13   Maryann Mikhail, DO  predniSONE (DELTASONE) 20 MG tablet Take 2 tablets (40 mg total) by mouth daily. 04/15/14    Samuel Jester, DO  tiZANidine (ZANAFLEX) 2 MG tablet Take 2 mg by mouth every 6 (six) hours as needed for muscle spasms.    Historical Provider, MD    Family History Family History  Problem Relation Age of Onset  . Emphysema Father   . Lung cancer Father     was a smoker  . Heart disease Father   . Prostate cancer Father   . Asthma Sister   . Uterine cancer Mother     Social History Social History  Substance Use Topics  . Smoking status: Current Every Day Smoker    Packs/day: 0.50    Years: 50.00    Types: Cigarettes  . Smokeless tobacco: Never Used  . Alcohol use Yes     Comment: BAC .20     Allergies   Lorazepam; Advil [ibuprofen]; and Vicodin [hydrocodone-acetaminophen]   Review of Systems Review of Systems  Respiratory: Positive for chest tightness and wheezing.   All other systems reviewed and are negative.    Physical Exam Updated Vital Signs BP 123/87 (BP Location: Left Arm)   Pulse 96   Temp 98.1 F (36.7 C) (Oral)   Resp 23   SpO2 99%   Physical Exam  Constitutional: She is oriented to person, place, and time. She appears well-developed and well-nourished.  HENT:  Head: Normocephalic and atraumatic.  Eyes: Conjunctivae and EOM are normal. Pupils are equal, round, and reactive to light.  Neck: Normal range of motion. Neck supple.  Cardiovascular: Normal rate and regular rhythm.  Exam reveals no gallop and no friction rub.   No murmur heard. Pulmonary/Chest: No respiratory distress. She has wheezes. She has no rales. She exhibits no tenderness.  Mildly increased work of breathing, wheezing bilaterally, able to speak in complete sentences  Abdominal: Soft. Bowel sounds are normal. She exhibits no distension and no mass. There is no tenderness. There is no rebound and no guarding.  Musculoskeletal: Normal range of motion. She exhibits no edema or tenderness.  Neurological: She is alert and oriented to person, place, and time.  Skin: Skin is warm  and dry.  Psychiatric: She has a normal mood and affect. Her behavior is normal. Judgment and thought content normal.  Nursing note and vitals reviewed.    ED Treatments / Results  Labs (all labs ordered are listed, but only abnormal results are displayed) Labs Reviewed - No data to display  EKG  EKG Interpretation None       Radiology No results found.  Procedures Procedures (including critical care time)  Medications Ordered in ED Medications  predniSONE (DELTASONE) tablet 60 mg (not administered)  albuterol (PROVENTIL) (2.5 MG/3ML) 0.083% nebulizer solution 5 mg (5 mg Nebulization Given 11/13/15 0946)     Initial Impression / Assessment and Plan / ED Course  I have reviewed the triage vital signs and the nursing notes.  Pertinent labs & imaging  results that were available during my care of the patient were reviewed by me and considered in my medical decision making (see chart for details).  Clinical Course     Patient with suspected asthma exacerbation. Will give breathing treatment and prednisone. Will reassess.  10:21 AM Patient feels much better. She is ready to be discharged. Patient seen by and discussed with Dr. Particia Nearing, who agrees with plan for discharge.  Final Clinical Impressions(s) / ED Diagnoses   Final diagnoses:  Exacerbation of asthma, unspecified asthma severity, unspecified whether persistent    New Prescriptions New Prescriptions   No medications on file     Roxy Horseman, PA-C 11/13/15 1021    Jacalyn Lefevre, MD 11/13/15 [phone]

## 2015-11-13 NOTE — ED Triage Notes (Signed)
Pt reports hx of asthma. She reports she is out of her nebulizer treatment. Some wheezing noted on ausculation.

## 2015-11-13 NOTE — ED Notes (Signed)
Nebulizer treatment done. Patient states that she is feeling much better and breathing better.

## 2015-12-05 ENCOUNTER — Encounter (HOSPITAL_COMMUNITY): Payer: Self-pay

## 2015-12-05 ENCOUNTER — Emergency Department (HOSPITAL_COMMUNITY): Payer: Medicare Other

## 2015-12-05 DIAGNOSIS — F17210 Nicotine dependence, cigarettes, uncomplicated: Secondary | ICD-10-CM | POA: Diagnosis not present

## 2015-12-05 DIAGNOSIS — J209 Acute bronchitis, unspecified: Secondary | ICD-10-CM | POA: Diagnosis not present

## 2015-12-05 DIAGNOSIS — J45909 Unspecified asthma, uncomplicated: Secondary | ICD-10-CM | POA: Diagnosis not present

## 2015-12-05 DIAGNOSIS — R0602 Shortness of breath: Secondary | ICD-10-CM | POA: Diagnosis present

## 2015-12-05 MED ORDER — ALBUTEROL SULFATE (2.5 MG/3ML) 0.083% IN NEBU
5.0000 mg | INHALATION_SOLUTION | Freq: Once | RESPIRATORY_TRACT | Status: AC
  Administered 2015-12-05: 5 mg via RESPIRATORY_TRACT
  Filled 2015-12-05: qty 6

## 2015-12-05 NOTE — ED Triage Notes (Signed)
Pt states that she began having flu- like symptoms last week. Her symptoms include nasal and chest congestion, cough, and chills. Pt has a hx of asthma. She states that she did not get the flu shot this year. A&Ox4. Ambulatory.

## 2015-12-06 ENCOUNTER — Emergency Department (HOSPITAL_COMMUNITY)
Admission: EM | Admit: 2015-12-06 | Discharge: 2015-12-06 | Disposition: A | Discharge status: Home / Self Care | Payer: Medicare Other | Attending: Emergency Medicine | Admitting: Emergency Medicine

## 2015-12-06 DIAGNOSIS — J209 Acute bronchitis, unspecified: Secondary | ICD-10-CM

## 2015-12-06 MED ORDER — PREDNISONE 20 MG PO TABS: 40.0000 mg | ORAL_TABLET | Freq: Every day | ORAL | 0 refills | Status: DC

## 2015-12-06 MED ORDER — BENZONATATE 100 MG PO CAPS
200.0000 mg | ORAL_CAPSULE | Freq: Once | ORAL | Status: AC
  Administered 2015-12-06: 200 mg via ORAL
  Filled 2015-12-06: qty 2

## 2015-12-06 MED ORDER — PREDNISONE 20 MG PO TABS
60.0000 mg | ORAL_TABLET | Freq: Once | ORAL | Status: AC
  Administered 2015-12-06: 60 mg via ORAL
  Filled 2015-12-06: qty 3

## 2015-12-06 MED ORDER — GUAIFENESIN-CODEINE 100-10 MG/5ML PO SOLN: 5.0000 mL | Freq: Four times a day (QID) | ORAL | 0 refills | Status: DC | PRN

## 2015-12-06 NOTE — ED Notes (Signed)
Patient was alert, oriented and stable upon discharge. RN went over AVS and patient had no further questions.  

## 2015-12-06 NOTE — Discharge Instructions (Signed)
Take prednisone as prescribed and using albuterol inhaler, 2 puffs every 4-6 hours, as needed for wheezing and shortness of breath. Use the cough syrup prescribed to you as needed. Follow-up with a primary care doctor regarding your visit today, to ensure resolution of symptoms.

## 2015-12-06 NOTE — ED Provider Notes (Signed)
[facility]-EMERGENCY DEPT Provider Note   CSN: [id number] Arrival date & time: 12/05/15  2316  By signing my name below, I, Modena Jansky, attest that this documentation has been prepared under the direction and in the presence of non-physician practitioner, Antony Madura, PA-C. Electronically Signed: Modena Jansky, Scribe. 12/06/2015. 1:19 AM.  History   Chief Complaint Chief Complaint  Patient presents with  . Cough  . Shortness of Breath   The history is provided by the patient. No language interpreter was used.  Cough  This is a recurrent problem. The current episode started more than 1 week ago. The problem occurs constantly. The problem has been gradually worsening. There has been no fever. Associated symptoms include chills, ear pain, sore throat and shortness of breath. She has tried decongestants and cough syrup for the symptoms. She is a smoker. Her past medical history is significant for asthma.  Shortness of Breath  Associated symptoms include sore throat, ear pain and cough. Pertinent negatives include no fever. Associated medical issues include asthma.   HPI Comments: Dawn Lane is a 65 y.o. female with a hx of asthma  who presents to the Emergency Department complaining of intermittent moderate cough that started about a week ago. She states she has been having gradually worsening URI-like symptoms, which include associated symptoms of sore throat, ear ache, chest congestion, and chills. She reports trying soup and various OTC medications without any relief. She admits to a hx of smoking. She denies any fever or other complaints.    Past Medical History:  Diagnosis Date  . Anxiety 2011  . Arthritis   . Asthma   . Emphysema   . Hemorrhoids   . Polysubstance abuse 2011   [facility] day [facility] admission for this. Cocaine, ETOH, MJA,   . Tobacco use     Patient Active Problem List   Diagnosis Date Noted  . Colitis 08/04/2013  . Abdominal pain 08/04/2013  . Nausea, vomiting  and diarrhea 08/04/2013  . Tobacco abuse 08/04/2013  . Dyspnea 08/02/2010    Past Surgical History:  Procedure Laterality Date  . COLONOSCOPY N/A 08/07/2013   Procedure: COLONOSCOPY;  Surgeon: Iva Boop, MD;  Location: [facility] END[facility];  Service: Endoscopy;  Laterality: N/A;  . INCISION AND DRAINAGE  12/2009   of thrombosed internal hemorrhoid  . TUBAL LIGATION      OB History    No data available       Home Medications    Prior to Admission medications   Medication Sig Start Date End Date Taking? Authorizing Provider  albuterol (PROVENTIL HFA;VENTOLIN HFA) 108 (90 BASE) MCG/ACT inhaler Inhale 2 puffs into the lungs every 6 (six) hours as needed for wheezing. 09/14/12  Yes Kristen N Ward, DO  albuterol (PROVENTIL) (2.5 MG/3ML) 0.083% nebulizer solution Take 3 mLs (2.5 mg total) by nebulization every 4 (four) hours as needed for wheezing or shortness of breath. 04/15/14  Yes Samuel Jester, DO  bisacodyl (DULCOLAX) 5 MG EC tablet Take 1 tablet (5 mg total) by mouth 2 (two) times daily. 08/07/13  Yes Maryann Mikhail, DO  gabapentin (NEURONTIN) 300 MG capsule Take 300 mg by mouth 2 (two) times daily.   Yes Historical Provider, MD  meloxicam (MOBIC) 15 MG tablet Take 15 mg by mouth 2 (two) times daily.   Yes Historical Provider, MD  oxyCODONE-acetaminophen (PERCOCET/ROXICET) 5-325 MG per tablet Take 1 tablet by mouth 3 (three) times daily as needed for severe pain. 08/06/13  Yes Maryann Mikhail, DO  tiZANidine (  ZANAFLEX) 2 MG tablet Take 2 mg by mouth every 6 (six) hours as needed for muscle spasms.   Yes Historical Provider, MD  dicyclomine (BENTYL) 20 MG tablet Take 1 tablet (20 mg total) by mouth 4 (four) times daily -  before meals and at bedtime. Patient not taking: Reported on 04/15/2014 08/06/13   Nita Sells Mikhail, DO  doxycycline (VIBRA-TABS) 100 MG tablet Take 1 tablet (100 mg total) by mouth 2 (two) times daily. Patient not taking: Reported on 06/19/2015 04/15/14   Samuel Jester, DO    feeding supplement, RESOURCE BREEZE, (RESOURCE BREEZE) LIQD Take 1 Container by mouth 3 (three) times daily between meals. Patient not taking: Reported on 12/06/2015 08/07/13   Nita Sells Mikhail, DO  guaiFENesin-codeine 100-10 MG/5ML syrup Take 5 mLs by mouth every 6 (six) hours as needed for cough. 12/06/15   Antony Madura, PA-C  nicotine (NICODERM CQ - DOSED IN MG/24 HOURS) 21 mg/24hr patch Place 1 patch (21 mg total) onto the skin daily. Patient not taking: Reported on 04/15/2014 08/06/13   Nita Sells Mikhail, DO  predniSONE (DELTASONE) 20 MG tablet Take 2 tablets (40 mg total) by mouth daily. 12/06/15   Antony Madura, PA-C    Family History Family History  Problem Relation Age of Onset  . Emphysema Father   . Lung cancer Father     was a smoker  . Heart disease Father   . Prostate cancer Father   . Asthma Sister   . Uterine cancer Mother     Social History Social History  Substance Use Topics  . Smoking status: Current Every Day Smoker    Packs/day: 0.50    Years: 50.00    Types: Cigarettes  . Smokeless tobacco: Never Used  . Alcohol use Yes     Comment: BAC .20     Allergies   Lorazepam; Advil [ibuprofen]; and Vicodin [hydrocodone-acetaminophen]   Review of Systems Review of Systems  Constitutional: Positive for chills. Negative for fever.  HENT: Positive for congestion (Chest), ear pain and sore throat.   Respiratory: Positive for cough and shortness of breath.   All other systems reviewed and are negative.    Physical Exam Updated Vital Signs BP 162/94 (BP Location: Left Arm)   Pulse 78   Temp 98.2 F (36.8 C) (Oral)   Resp 20   Ht 5\' 6"  (1.676 m)   Wt 151 lb (68.5 kg)   SpO2 97%   BMI 24.37 kg/m   Physical Exam  Constitutional: She is oriented to person, place, and time. She appears well-developed and well-nourished. No distress.  Nontoxic and in NAD  HENT:  Head: Normocephalic and atraumatic.  Mild erythema in posterior oropharynx. No angioedema.  Eyes:  Conjunctivae and EOM are normal. No scleral icterus.  Neck: Normal range of motion.  Cardiovascular: Normal rate, regular rhythm and intact distal pulses.   Pulmonary/Chest: Effort normal. No respiratory distress. She has no wheezes. She has no rales.  Sporadic, dry cough appreciated at bedside. No tachypnea or dyspnea. No rales or wheezing bilaterally.  Musculoskeletal: Normal range of motion.  Neurological: She is alert and oriented to person, place, and time. She exhibits normal muscle tone. Coordination normal.  Skin: Skin is warm and dry. No rash noted. She is not diaphoretic. No erythema. No pallor.  Psychiatric: She has a normal mood and affect. Her behavior is normal.  Nursing note and vitals reviewed.    ED Treatments / Results  DIAGNOSTIC STUDIES: Oxygen Saturation is 97% on RA, normal by  my interpretation.    COORDINATION OF CARE: 1:23 AM- Pt advised of plan for treatment and pt agrees.  Labs (all labs ordered are listed, but only abnormal results are displayed) Labs Reviewed - No data to display  EKG  EKG Interpretation None       Radiology Dg Chest 2 View  Result Date: 12/06/2015 CLINICAL DATA:  Shortness of breath EXAM: CHEST  2 VIEW COMPARISON:  Chest CT 04/15/2014. FINDINGS: Lungs are hyperexpanded. Cardiomediastinal contours are normal. No focal airspace consolidation or pulmonary edema. No pneumothorax or pleural effusion. IMPRESSION: Hyperexpanded lungs, suggesting COPD.  No focal airspace disease. Electronically Signed   By: Deatra Robinson M.D.   On: 12/06/2015 00:29    Procedures Procedures (including critical care time)  Medications Ordered in ED Medications  albuterol (PROVENTIL) (2.5 MG/3ML) 0.083% nebulizer solution 5 mg (5 mg Nebulization Given 12/05/15 2343)  benzonatate (TESSALON) capsule 200 mg (200 mg Oral Given 12/06/15 0136)  predniSONE (DELTASONE) tablet 60 mg (60 mg Oral Given 12/06/15 0136)     Initial Impression / Assessment and Plan  / ED Course  I have reviewed the triage vital signs and the nursing notes.  Pertinent labs & imaging results that were available during my care of the patient were reviewed by me and considered in my medical decision making (see chart for details).  Clinical Course     Pt CXR negative for acute infiltrate. Patient's symptoms are consistent with acute bronchitis, likely viral etiology. Discussed that antibiotics are not indicated for viral infections. Patient will be discharged with symptomatic treatment. Sheerbalizes understanding and is agreeable with plan. Pt is hemodynamically stable and in NAD prior to discharge.   Final Clinical Impressions(s) / ED Diagnoses   Final diagnoses:  Acute bronchitis, unspecified organism    New Prescriptions Discharge Medication List as of 12/06/2015  1:39 AM      I personally performed the services described in this documentation, which was scribed in my presence. The recorded information has been reviewed and is accurate.       Antony Madura, PA-C 12/06/15 7253    Tomasita Crumble, MD 12/06/15 [phone]

## 2015-12-28 ENCOUNTER — Emergency Department (HOSPITAL_COMMUNITY): Payer: Medicare Other

## 2015-12-28 ENCOUNTER — Emergency Department (HOSPITAL_COMMUNITY)
Admission: EM | Admit: 2015-12-28 | Discharge: 2015-12-28 | Disposition: A | Discharge status: Home / Self Care | Payer: Medicare Other | Attending: Emergency Medicine | Admitting: Emergency Medicine

## 2015-12-28 ENCOUNTER — Encounter (HOSPITAL_COMMUNITY): Payer: Self-pay

## 2015-12-28 DIAGNOSIS — F17210 Nicotine dependence, cigarettes, uncomplicated: Secondary | ICD-10-CM | POA: Insufficient documentation

## 2015-12-28 DIAGNOSIS — Z79899 Other long term (current) drug therapy: Secondary | ICD-10-CM | POA: Insufficient documentation

## 2015-12-28 DIAGNOSIS — J45909 Unspecified asthma, uncomplicated: Secondary | ICD-10-CM | POA: Diagnosis not present

## 2015-12-28 DIAGNOSIS — R059 Cough, unspecified: Secondary | ICD-10-CM

## 2015-12-28 DIAGNOSIS — R05 Cough: Secondary | ICD-10-CM | POA: Diagnosis not present

## 2015-12-28 MED ORDER — IPRATROPIUM-ALBUTEROL 0.5-2.5 (3) MG/3ML IN SOLN
3.0000 mL | RESPIRATORY_TRACT | Status: DC
  Administered 2015-12-28: 3 mL via RESPIRATORY_TRACT
  Filled 2015-12-28: qty 3

## 2015-12-28 MED ORDER — PREDNISONE 20 MG PO TABS: 40.0000 mg | ORAL_TABLET | Freq: Every day | ORAL | 0 refills | Status: DC

## 2015-12-28 NOTE — Discharge Instructions (Signed)
Your exam, chest x-ray were all very reassuring. Take your medications as prescribed. Follow-up with your pulmonologist for your regularly scheduled appointment. Return to ED for new or worsening symptoms as we discussed.

## 2015-12-28 NOTE — ED Triage Notes (Signed)
Per Pt, PT is coming from home with complaints of a "lung infection." Pt reports, "I have had this before and I can't see my pulmonologist until 12/26 so I came in. My lung is infected again." Pt reports productive cough, sore throat, and chills.

## 2015-12-28 NOTE — ED Notes (Signed)
Patient transported to X-ray 

## 2015-12-28 NOTE — ED Provider Notes (Signed)
[facility]-EMERGENCY DEPT Provider Note   CSN: [id number] Arrival date & time: 12/28/15  1140     History   Chief Complaint Chief Complaint  Patient presents with  . Cough    HPI Dawn Lane is a 65 y.o. female.  HPI evaluation of cough. Patient reports symptoms started yesterday. She also reports she was seen for this problem 2 times over the past 2 months and feels better after steroids. States she has an appointment with pulmonology on December 26. She denies any fevers, chills, overt shortness of breath, chest pain, leg swelling. Nothing tried to improve symptoms. Although she does think a breathing treatment would help her symptoms.  Past Medical History:  Diagnosis Date  . Anxiety 2011  . Arthritis   . Asthma   . Emphysema   . Hemorrhoids   . Polysubstance abuse 2011   [facility] day [facility] admission for this. Cocaine, ETOH, MJA,   . Tobacco use     Patient Active Problem List   Diagnosis Date Noted  . Colitis 08/04/2013  . Abdominal pain 08/04/2013  . Nausea, vomiting and diarrhea 08/04/2013  . Tobacco abuse 08/04/2013  . Dyspnea 08/02/2010    Past Surgical History:  Procedure Laterality Date  . COLONOSCOPY N/A 08/07/2013   Procedure: COLONOSCOPY;  Surgeon: Iva Boop, MD;  Location: [facility] ENDOSCOPY;  Service: Endoscopy;  Laterality: N/A;  . INCISION AND DRAINAGE  12/2009   of thrombosed internal hemorrhoid  . TUBAL LIGATION      OB History    No data available       Home Medications    Prior to Admission medications   Medication Sig Start Date End Date Taking? Authorizing Provider  albuterol (PROVENTIL HFA;VENTOLIN HFA) 108 (90 BASE) MCG/ACT inhaler Inhale 2 puffs into the lungs every 6 (six) hours as needed for wheezing. 09/14/12   Kristen N Ward, DO  albuterol (PROVENTIL) (2.5 MG/3ML) 0.083% nebulizer solution Take 3 mLs (2.5 mg total) by nebulization every 4 (four) hours as needed for wheezing or shortness of breath. 04/15/14   Samuel Jester, DO    bisacodyl (DULCOLAX) 5 MG EC tablet Take 1 tablet (5 mg total) by mouth 2 (two) times daily. 08/07/13   Maryann Mikhail, DO  dicyclomine (BENTYL) 20 MG tablet Take 1 tablet (20 mg total) by mouth 4 (four) times daily -  before meals and at bedtime. Patient not taking: Reported on 04/15/2014 08/06/13   Nita Sells Mikhail, DO  doxycycline (VIBRA-TABS) 100 MG tablet Take 1 tablet (100 mg total) by mouth 2 (two) times daily. Patient not taking: Reported on 06/19/2015 04/15/14   Samuel Jester, DO  feeding supplement, RESOURCE BREEZE, (RESOURCE BREEZE) LIQD Take 1 Container by mouth 3 (three) times daily between meals. Patient not taking: Reported on 12/06/2015 08/07/13   Nita Sells Mikhail, DO  gabapentin (NEURONTIN) 300 MG capsule Take 300 mg by mouth 2 (two) times daily.    Historical Provider, MD  guaiFENesin-codeine 100-10 MG/5ML syrup Take 5 mLs by mouth every 6 (six) hours as needed for cough. 12/06/15   Antony Madura, PA-C  meloxicam (MOBIC) 15 MG tablet Take 15 mg by mouth 2 (two) times daily.    Historical Provider, MD  nicotine (NICODERM CQ - DOSED IN MG/24 HOURS) 21 mg/24hr patch Place 1 patch (21 mg total) onto the skin daily. Patient not taking: Reported on 04/15/2014 08/06/13   Nita Sells Mikhail, DO  oxyCODONE-acetaminophen (PERCOCET/ROXICET) 5-325 MG per tablet Take 1 tablet by mouth 3 (three) times daily as needed  for severe pain. 08/06/13   Maryann Mikhail, DO  predniSONE (DELTASONE) 20 MG tablet Take 2 tablets (40 mg total) by mouth daily. 12/06/15   Antony Madura, PA-C  predniSONE (DELTASONE) 20 MG tablet Take 2 tablets (40 mg total) by mouth daily. 12/28/15   Joycie Peek, PA-C  tiZANidine (ZANAFLEX) 2 MG tablet Take 2 mg by mouth every 6 (six) hours as needed for muscle spasms.    Historical Provider, MD    Family History Family History  Problem Relation Age of Onset  . Emphysema Father   . Lung cancer Father     was a smoker  . Heart disease Father   . Prostate cancer Father   . Asthma  Sister   . Uterine cancer Mother     Social History Social History  Substance Use Topics  . Smoking status: Current Every Day Smoker    Packs/day: 0.50    Years: 50.00    Types: Cigarettes  . Smokeless tobacco: Never Used  . Alcohol use Yes     Comment: BAC .20     Allergies   Lorazepam; Advil [ibuprofen]; and Vicodin [hydrocodone-acetaminophen]   Review of Systems Review of Systems A complete review of systems was completed and was negative except for pertinent positives and negatives as mentioned in the history of present illness    Physical Exam Updated Vital Signs BP 148/90   Pulse 78   Temp 98.3 F (36.8 C) (Oral)   Resp 18   Ht 5\' 6"  (1.676 m)   Wt 67.2 kg   SpO2 94%   BMI 23.91 kg/m   Physical Exam  Constitutional: She appears well-developed. No distress.  Awake, alert and nontoxic in appearance  HENT:  Head: Normocephalic and atraumatic.  Right Ear: External ear normal.  Left Ear: External ear normal.  Mouth/Throat: Oropharynx is clear and moist.  Eyes: Conjunctivae and EOM are normal. Pupils are equal, round, and reactive to light.  Neck: Normal range of motion. No JVD present.  Cardiovascular: Normal rate, regular rhythm and normal heart sounds.   Pulmonary/Chest: Effort normal and breath sounds normal. No stridor. No respiratory distress.  Intermittent, dry cough. No obvious adventitious lung sounds.  Abdominal: Soft. There is no tenderness.  Musculoskeletal: Normal range of motion.  Neurological:  Awake, alert, cooperative and aware of situation; motor strength bilaterally; sensation normal to light touch bilaterally; no facial asymmetry; tongue midline; major cranial nerves appear intact;  baseline gait without new ataxia.  Skin: No rash noted. She is not diaphoretic.  Psychiatric: She has a normal mood and affect. Her behavior is normal. Thought content normal.  Nursing note and vitals reviewed.   Vitals:   12/28/15 1146 12/28/15 1245  12/28/15 1300 12/28/15 1315  BP: 128/88 125/77 131/91 148/90  Pulse: 75 62 (!) 57 78  Resp: 18     Temp: 98.3 F (36.8 C)     TempSrc: Oral     SpO2: 97% 97% 97% 94%  Weight: 67.2 kg     Height:        ED Treatments / Results  Labs (all labs ordered are listed, but only abnormal results are displayed) Labs Reviewed - No data to display  EKG  EKG Interpretation None       Radiology Dg Chest 2 View  Result Date: 12/28/2015 CLINICAL DATA:  Cough EXAM: CHEST  2 VIEW COMPARISON:  12/06/2015 FINDINGS: COPD with pulmonary hyperinflation. Negative for pneumonia. Heart size is enlarged. Negative for heart failure or edema.  No mass or effusion. IMPRESSION: COPD without acute cardiopulmonary abnormality Electronically Signed   By: Marlan Palau M.D.   On: 12/28/2015 12:33    Procedures Procedures (including critical care time)  Medications Ordered in ED Medications  ipratropium-albuterol (DUONEB) 0.5-2.5 (3) MG/3ML nebulizer solution 3 mL (3 mLs Nebulization Given 12/28/15 1234)     Initial Impression / Assessment and Plan / ED Course  I have reviewed the triage vital signs and the nursing notes.  Pertinent labs & imaging results that were available during my care of the patient were reviewed by me and considered in my medical decision making (see chart for details).  Clinical Course     History of COPD presents for evaluation of dry cough. Exam is reassuring, chest x-ray shows no acute cardiopulmonary pathology. Expresses relief with DuoNeb. We'll give short course steroids. Stress importance of follow-up with pulmonology on 26. Return precautions discussed. Hemodynamically stable, afebrile and appropriate for discharge and outpatient follow-up. Prior to patient discharge, I discussed and reviewed this case with Dr.Liu     Final Clinical Impressions(s) / ED Diagnoses   Final diagnoses:  Cough    New Prescriptions Discharge Medication List as of 12/28/2015  1:22 PM      START taking these medications   Details  !! predniSONE (DELTASONE) 20 MG tablet Take 2 tablets (40 mg total) by mouth daily., Starting Thu 12/28/2015, Print     !! - Potential duplicate medications found. Please discuss with provider.       Joycie Peek, PA-C 12/28/15 1340    Lavera Guise, MD 12/29/15 1115

## 2016-01-02 ENCOUNTER — Institutional Professional Consult (permissible substitution): Payer: Medicare Other | Admitting: Internal Medicine

## 2016-01-03 ENCOUNTER — Ambulatory Visit (INDEPENDENT_AMBULATORY_CARE_PROVIDER_SITE_OTHER): Payer: Medicare Other | Admitting: Internal Medicine

## 2016-01-03 ENCOUNTER — Encounter: Payer: Self-pay | Admitting: Internal Medicine

## 2016-01-03 VITALS — BP 116/80 | HR 74 | Ht 66.0 in | Wt 146.4 lb

## 2016-01-03 DIAGNOSIS — R0609 Other forms of dyspnea: Secondary | ICD-10-CM | POA: Diagnosis not present

## 2016-01-03 DIAGNOSIS — F17210 Nicotine dependence, cigarettes, uncomplicated: Secondary | ICD-10-CM

## 2016-01-03 DIAGNOSIS — J449 Chronic obstructive pulmonary disease, unspecified: Secondary | ICD-10-CM | POA: Diagnosis not present

## 2016-01-03 MED ORDER — PREDNISONE 10 MG PO TABS: ORAL_TABLET | ORAL | 0 refills | Status: DC

## 2016-01-03 MED ORDER — AZITHROMYCIN 250 MG PO TABS: ORAL_TABLET | ORAL | 0 refills | Status: DC

## 2016-01-03 MED ORDER — GUAIFENESIN-CODEINE 100-10 MG/5ML PO SOLN: 5.0000 mL | ORAL | 0 refills | Status: DC | PRN

## 2016-01-03 MED ORDER — BUDESONIDE-FORMOTEROL FUMARATE 160-4.5 MCG/ACT IN AERO: INHALATION_SPRAY | RESPIRATORY_TRACT | Status: DC

## 2016-01-03 MED ORDER — NICOTINE 21 MG/24HR TD PT24: 21.0000 mg | MEDICATED_PATCH | Freq: Every day | TRANSDERMAL | 0 refills | Status: DC

## 2016-01-03 NOTE — Patient Instructions (Addendum)
zpak Prednisone 10 mg take  4 each am x 2 days,   2 each am x 2 days,  1 each am x 2 days and stop   For cough > guaifensin with codeine 1-2 tsp every 4 hours   Nicotine patch 21 mg /day  Work on inhaler technique:  relax and gently blow all the way out then take a nice smooth deep breath back in, triggering the inhaler at same time you start breathing in.  Hold for up to 5 seconds if you can. Blow out thru nose. Rinse and gargle with water when done     Plan A = Automatic = symbicort 160  Take 2 puffs first thing in am and then another 2 puffs about 12 hours later.     Plan B = Backup Only use your albuterol as a rescue medication to be used if you can't catch your breath by resting or doing a relaxed purse lip breathing pattern.  - The less you use it, the better it will work when you need it. - Ok to use the inhaler up to 2 puffs  every 4 hours if you must but call for appointment if use goes up over your usual need - Don't leave home without it !!  (think of it like the spare tire for your car)   Plan C = Crisis - only use your albuterol nebulizer if you first try Plan B and it fails to help > ok to use the nebulizer up to every 4 hours but if start needing it regularly call for immediate appointment    Please schedule a follow up office visit in 2 weeks, sooner if needed with Tammy NP - with all medications in hand  Needs spirometry on return and be sure the symbicort sample is being used (should be on zero if returns in 2 weeks)

## 2016-01-03 NOTE — Progress Notes (Signed)
Subjective:    Patient ID: Dawn Lane, female    DOB: [birth year]     MRN: [id number]  HPI  50 yobf active smoker athletic as child/ ran track / onset of AB in 1995 on inhalers ever since then on a daily basis eval by Dr Jethro Bolus neg allergies  "but bad left lung" with last decent day in September 2017 but most days very limited by breathing to where spb across the room referred to pulmonary clinic 01/03/2016 by Dr   Ronne Binning.  Note seen  08/02/10 with similar complaints and treated for gerd/scheduled for pfts and did not f/u as rec    01/03/2016  Pulmonary office visit/ [facility]  / re-establish care  Chief Complaint  Patient presents with  . Pulm Consult    From Dr. Ronne Binning for a cough. Discharged from the hospital on Dec 21st.   On best days x years  best can do maybe one block before gives slower pace than others = MMRC3 = can't walk 100 yards even at a slow pace at a flat grade s stopping due to sob  / worse since September 2017  admit x 2 and gradually worse since on prednisone completed 01/02/16 p seen in ER 12/28/15  Also worse cough/ sense of chest congestion at hs and early in am but not coughing up much at all. Rx  multiple forms of saba with no true maint rx per her recall / nebs work the best but only for a few hours  Note failed dulera per notes from 08/02/10 and mainly had upper airway features to symptoms, does not remember if cough improved on GERD rx and no longer taking any   No obvious day to day or daytime variability or assoc excess/ purulent sputum or mucus plugs or hemoptysis or cp or chest tightness, subjective wheeze or overt sinus or hb symptoms. No unusual exp hx or h/o childhood pna/ asthma or knowledge of premature birth.  Sleeping ok without nocturnal  or early am exacerbation  of respiratory  c/o's or need for noct saba. Also denies any obvious fluctuation of symptoms with weather or environmental changes or other aggravating or alleviating factors except as  outlined above   Current Medications, Allergies, Complete Past Medical History, Past Surgical History, Family History, and Social History were reviewed in [employer] record.     Review of Systems  Constitutional: Negative for fever and unexpected weight change.  HENT: Positive for congestion. Negative for dental problem, ear pain, nosebleeds, postnasal drip, rhinorrhea, sinus pressure, sneezing, sore throat and trouble swallowing.   Eyes: Negative for redness and itching.  Respiratory: Positive for cough and shortness of breath. Negative for chest tightness and wheezing.   Cardiovascular: Negative for palpitations and leg swelling.  Gastrointestinal: Negative for nausea and vomiting.  Genitourinary: Negative for dysuria.  Musculoskeletal: Negative for joint swelling.  Skin: Negative for rash.  Neurological: Negative for headaches.  Hematological: Does not bruise/bleed easily.  Psychiatric/Behavioral: Negative for dysphoric mood. The patient is nervous/anxious.        Objective:   Physical Exam  amb bf nad  Wt Readings from Last 3 Encounters:  01/03/16 146 lb 6.4 oz (66.4 kg)  12/28/15 148 lb 2 oz (67.2 kg)  12/05/15 151 lb (68.5 kg)    Vital signs reviewed  - Note on arrival 02 sats  98% on RA     HEENT: nl dentition, turbinates, and oropharynx. Nl external ear canals without cough reflex  NECK :  without JVD/Nodes/TM/ nl carotid upstrokes bilaterally   LUNGS: no acc muscle use,  slt barrel contour chest/ insp and exp rhonchi L > R    CV:  RRR  no s3 or murmur or increase in P2, nad no edema   ABD:  soft and nontender with nl inspiratory excursion in the supine position. No bruits or organomegaly appreciated, bowel sounds nl  MS:  Nl gait/ ext warm without deformities, calf tenderness, cyanosis or clubbing No obvious joint restrictions   SKIN: warm and dry without lesions    NEURO:  alert, approp, nl sensorium with  no motor or cerebellar  deficits apparent.     I personally reviewed images and agree with radiology impression as follows:  CXR:   12/28/15  COPD with pulmonary hyperinflation. Negative for pneumonia. Heart size is enlarged. Negative for heart failure or edema. No mass or Effusion and without acute cardiopulmonary abnormality     Assessment & Plan:

## 2016-01-04 DIAGNOSIS — J449 Chronic obstructive pulmonary disease, unspecified: Secondary | ICD-10-CM | POA: Insufficient documentation

## 2016-01-04 NOTE — Assessment & Plan Note (Signed)
-   Allergy w/u reported neg 2012 @ [facility]     - Aggressive gerd rx started 08/02/2010 > did not f/u   Suspect now has evolved to more of a copd/AB component as does improve with prednisone > see copd

## 2016-01-04 NOTE — Assessment & Plan Note (Addendum)
01/03/2016  After extensive coaching HFA effectiveness =    75% from a baseline on 25% > try symb 160 2bid x one sample and return in 2 weeks   DDX of  difficult airways management almost all start with A and  include Adherence, Ace Inhibitors, Acid Reflux, Active Sinus Disease, Alpha 1 Antitripsin deficiency, Anxiety masquerading as Airways dz,  ABPA,  Allergy(esp in young), Aspiration (esp in elderly), Adverse effects of meds,  Active smokers, A bunch of PE's (a small clot burden can't cause this syndrome unless there is already severe underlying pulm or vascular dz with poor reserve) plus two Bs  = Bronchiectasis and Beta blocker use..and one C= CHF   Adherence is always the initial "prime suspect" and is a multilayered concern that requires a "trust but verify" approach in every patient - starting with knowing how to use medications, especially inhalers, correctly, keeping up with refills and understanding the fundamental difference between maintenance and prns vs those medications only taken for a very short course and then stopped and not refilled.  - see hfa teaching - advised will need to keep appts and return with all meds in hand using a trust but verify approach to confirm accurate Medication  Reconciliation The principal here is that until we are certain that the  patients are doing what we've asked, it makes no sense to ask them to do more.   Active smoking  (see separate a/p)   ? Allergy > neg per prev w/u and not likely now > rx only short course prednisone   ? Active sinus dz > doubt/ rx zpak, do sinus ct before any more abx  ? Acid (or non-acid) GERD > always difficult to exclude as up to 75% of pts in some series report no assoc GI/ Heartburn symptoms> rec consider trial max (24h)  acid suppression and diet restrictions if not better on return  ? Anxiety > usually at the bottom of this list of usual suspects but should be much higher on this pt's based on H and P   ?  Bronchiectasis > not apparent on last ct 04/15/14 but did have suggestion of mild bronchomalacia on L and does have a little more rhonchi on L vs R  Will bring her back in 2 weeks to first verify taking meds and then do spirometry   Total time devoted to counseling  > 50 % of 60 moffice visit:  review case with pt/ discussion of options/alternatives/ personally creating written customized instructions  in presence of pt  then going over those specific  Instructions directly with the pt including how to use all of the meds but in particular covering each new medication in detail and the difference between the maintenance/automatic meds and the prns using an action plan format for the latter.  Please see AVS from this visit for a full list of these instructions

## 2016-01-04 NOTE — Assessment & Plan Note (Signed)
>   3 min discussion  I emphasized that although we never turn away smokers from the pulmonary clinic, we do ask that they understand that the recommendations that we make  won't work nearly as well in the presence of continued cigarette exposure.  In fact, we may very well  reach a point where we can't promise to help the patient if he/she can't quit smoking. (We can and will promise to try to help, we just can't promise what we recommend will really work)   Offered nicotine patches,  Did not care for e cigs previously

## 2016-01-18 ENCOUNTER — Encounter: Payer: Medicare Other | Admitting: Adult Health

## 2016-03-20 ENCOUNTER — Emergency Department (HOSPITAL_COMMUNITY): Payer: Medicare Other

## 2016-03-20 ENCOUNTER — Encounter (HOSPITAL_COMMUNITY): Payer: Self-pay | Admitting: Emergency Medicine

## 2016-03-20 ENCOUNTER — Emergency Department (HOSPITAL_COMMUNITY)
Admission: EM | Admit: 2016-03-20 | Discharge: 2016-03-20 | Disposition: A | Discharge status: Home / Self Care | Payer: Medicare Other | Attending: Emergency Medicine | Admitting: Emergency Medicine

## 2016-03-20 DIAGNOSIS — Z79899 Other long term (current) drug therapy: Secondary | ICD-10-CM | POA: Insufficient documentation

## 2016-03-20 DIAGNOSIS — R4182 Altered mental status, unspecified: Secondary | ICD-10-CM | POA: Diagnosis present

## 2016-03-20 DIAGNOSIS — J449 Chronic obstructive pulmonary disease, unspecified: Secondary | ICD-10-CM | POA: Insufficient documentation

## 2016-03-20 DIAGNOSIS — F1910 Other psychoactive substance abuse, uncomplicated: Secondary | ICD-10-CM | POA: Diagnosis not present

## 2016-03-20 DIAGNOSIS — F17210 Nicotine dependence, cigarettes, uncomplicated: Secondary | ICD-10-CM | POA: Diagnosis not present

## 2016-03-20 DIAGNOSIS — J45909 Unspecified asthma, uncomplicated: Secondary | ICD-10-CM | POA: Insufficient documentation

## 2016-03-20 LAB — RAPID URINE DRUG SCREEN, HOSP PERFORMED
AMPHETAMINES: NOT DETECTED
BENZODIAZEPINES: NOT DETECTED
Barbiturates: NOT DETECTED
Cocaine: POSITIVE — AB
Opiates: POSITIVE — AB
TETRAHYDROCANNABINOL: NOT DETECTED

## 2016-03-20 LAB — URINALYSIS, ROUTINE W REFLEX MICROSCOPIC
Glucose, UA: NEGATIVE mg/dL
Ketones, ur: 15 mg/dL — AB
NITRITE: NEGATIVE
PH: 5.5 (ref 5.0–8.0)
Protein, ur: 30 mg/dL — AB

## 2016-03-20 LAB — COMPREHENSIVE METABOLIC PANEL
ALBUMIN: 4.5 g/dL (ref 3.5–5.0)
ALT: 17 U/L (ref 14–54)
ANION GAP: 14 (ref 5–15)
AST: 29 U/L (ref 15–41)
Alkaline Phosphatase: 60 U/L (ref 38–126)
BUN: 14 mg/dL (ref 6–20)
CHLORIDE: 99 mmol/L — AB (ref 101–111)
CO2: 24 mmol/L (ref 22–32)
Calcium: 9.6 mg/dL (ref 8.9–10.3)
Creatinine, Ser: 0.92 mg/dL (ref 0.44–1.00)
GFR calc Af Amer: >60 mL/min (ref >60)
GFR calc non Af Amer: >60 mL/min (ref >60)
GLUCOSE: 93 mg/dL (ref 65–99)
POTASSIUM: 3.9 mmol/L (ref 3.5–5.1)
SODIUM: 137 mmol/L (ref 135–145)
TOTAL PROTEIN: 7.5 g/dL (ref 6.5–8.1)
Total Bilirubin: 2.1 mg/dL — ABNORMAL HIGH (ref 0.3–1.2)

## 2016-03-20 LAB — CBC WITH DIFFERENTIAL/PLATELET
BASOS ABS: 0 10*3/uL (ref 0.0–0.1)
BASOS PCT: 0 %
EOS PCT: 1 %
Eosinophils Absolute: 0.1 10*3/uL (ref 0.0–0.7)
HCT: 53.1 % — ABNORMAL HIGH (ref 36.0–46.0)
Hemoglobin: 17.6 g/dL — ABNORMAL HIGH (ref 12.0–15.0)
LYMPHS PCT: 30 %
Lymphs Abs: 1.8 10*3/uL (ref 0.7–4.0)
MCH: 27.8 pg (ref 26.0–34.0)
MCHC: 33.1 g/dL (ref 30.0–36.0)
MCV: 83.9 fL (ref 78.0–100.0)
Monocytes Absolute: 0.6 10*3/uL (ref 0.1–1.0)
Monocytes Relative: 10 %
NEUTROS ABS: 3.4 10*3/uL (ref 1.7–7.7)
Neutrophils Relative %: 59 %
PLATELETS: 231 10*3/uL (ref 150–400)
RBC: 6.33 MIL/uL — AB (ref 3.87–5.11)
RDW: 13.2 % (ref 11.5–15.5)
WBC: 5.8 10*3/uL (ref 4.0–10.5)

## 2016-03-20 LAB — URINALYSIS, MICROSCOPIC (REFLEX)

## 2016-03-20 LAB — ACETAMINOPHEN LEVEL

## 2016-03-20 LAB — SALICYLATE LEVEL: Salicylate Lvl: <7 mg/dL (ref 2.8–30.0)

## 2016-03-20 LAB — ETHANOL: Alcohol, Ethyl (B): <5 mg/dL (ref <5)

## 2016-03-20 MED ORDER — IPRATROPIUM-ALBUTEROL 0.5-2.5 (3) MG/3ML IN SOLN
3.0000 mL | Freq: Once | RESPIRATORY_TRACT | Status: AC
  Administered 2016-03-20: 3 mL via RESPIRATORY_TRACT
  Filled 2016-03-20: qty 3

## 2016-03-20 NOTE — ED Provider Notes (Signed)
[facility]-EMERGENCY DEPT Provider Note   CSN: [id number] Arrival date & time: 03/20/16  0554     History   Chief Complaint Chief Complaint  Patient presents with  . Altered Mental Status    HPI Dawn Lane is a 66 y.o. female.  Patient with PMH of polysubstance abuse, asthma presents to the ED with a chief complaint of altered mental status.  She is [place]," where she was found to be unresponsive by a friend. She is unable to contribute to her history.  EMS noticed some wheezing and rhonchi and gave a neb with some improvement.   The history is provided by the patient. No language interpreter was used.    Past Medical History:  Diagnosis Date  . Anxiety 2011  . Arthritis   . Asthma   . Emphysema   . Hemorrhoids   . Polysubstance abuse 2011   4 day [facility] admission for this. Cocaine, ETOH, MJA,   . Tobacco use     Patient Active Problem List   Diagnosis Date Noted  . COPD active smoker/ pfts pending 01/04/2016  . Colitis 08/04/2013  . Abdominal pain 08/04/2013  . Nausea, vomiting and diarrhea 08/04/2013  . Cigarette smoker 08/04/2013  . Dyspnea 08/02/2010    Past Surgical History:  Procedure Laterality Date  . COLONOSCOPY N/A 08/07/2013   Procedure: COLONOSCOPY;  Surgeon: Iva Boop, MD;  Location: [facility] ENDOSCOPY;  Service: Endoscopy;  Laterality: N/A;  . INCISION AND DRAINAGE  12/2009   of thrombosed internal hemorrhoid  . TUBAL LIGATION      OB History    No data available       Home Medications    Prior to Admission medications   Medication Sig Start Date End Date Taking? Authorizing Provider  albuterol (PROVENTIL HFA;VENTOLIN HFA) 108 (90 BASE) MCG/ACT inhaler Inhale 2 puffs into the lungs every 6 (six) hours as needed for wheezing. 09/14/12   Kristen N Ward, DO  albuterol (PROVENTIL) (2.5 MG/3ML) 0.083% nebulizer solution Take 3 mLs (2.5 mg total) by nebulization every 4 (four) hours as needed for wheezing or shortness of  breath. 04/15/14   Samuel Jester, DO  azithromycin (ZITHROMAX) 250 MG tablet Take 2 on day one then 1 daily x 4 days 01/03/16   Nyoka Cowden, MD  bisacodyl (DULCOLAX) 5 MG EC tablet Take 1 tablet (5 mg total) by mouth 2 (two) times daily. 08/07/13   Maryann Mikhail, DO  budesonide-formoterol (SYMBICORT) 160-4.5 MCG/ACT inhaler Take 2 puffs first thing in am and then another 2 puffs about 12 hours later. 01/03/16   Nyoka Cowden, MD  gabapentin (NEURONTIN) 300 MG capsule Take 300 mg by mouth 2 (two) times daily.    Historical Provider, MD  guaiFENesin-codeine 100-10 MG/5ML syrup Take 5 mLs by mouth every 4 (four) hours as needed for cough. 01/03/16   Nyoka Cowden, MD  meloxicam (MOBIC) 15 MG tablet Take 15 mg by mouth 2 (two) times daily.    Historical Provider, MD  nicotine (EQ NICOTINE) 21 mg/24hr patch Place 1 patch (21 mg total) onto the skin daily. 01/03/16   Nyoka Cowden, MD  oxyCODONE-acetaminophen (PERCOCET/ROXICET) 5-325 MG per tablet Take 1 tablet by mouth 3 (three) times daily as needed for severe pain. 08/06/13   Maryann Mikhail, DO  predniSONE (DELTASONE) 10 MG tablet Take  4 each am x 2 days,   2 each am x 2 days,  1 each am x 2 days and  stop 01/03/16   Nyoka Cowden, MD  tiZANidine (ZANAFLEX) 2 MG tablet Take 2 mg by mouth every 6 (six) hours as needed for muscle spasms.    Historical Provider, MD    Family History Family History  Problem Relation Age of Onset  . Emphysema Father   . Lung cancer Father     was a smoker  . Heart disease Father   . Prostate cancer Father   . Asthma Sister   . Uterine cancer Mother     Social History Social History  Substance Use Topics  . Smoking status: Current Every Day Smoker    Packs/day: 0.50    Years: 50.00    Types: Cigarettes  . Smokeless tobacco: Never Used  . Alcohol use Yes     Comment: BAC .20     Allergies   Lorazepam; Advil [ibuprofen]; and Vicodin [hydrocodone-acetaminophen]   Review of Systems Review of  Systems  Unable to perform ROS: Mental status change     Physical Exam Updated Vital Signs BP 112/90 (BP Location: Left Arm)   Pulse 61   Temp 98.5 F (36.9 C) (Oral)   Resp 14   Ht 5\' 5"  (1.651 m)   Wt 65.8 kg   SpO2 98%   BMI 24.13 kg/m   Physical Exam  Constitutional: She appears well-developed and well-nourished.  HENT:  Head: Normocephalic and atraumatic.  Eyes: Conjunctivae and EOM are normal. Pupils are equal, round, and reactive to light.  Neck: Normal range of motion. Neck supple.  Cardiovascular: Normal rate and regular rhythm.  Exam reveals no gallop and no friction rub.   No murmur heard. Pulmonary/Chest: Effort normal. No respiratory distress. She has wheezes. She has no rales. She exhibits no tenderness.  Abdominal: Soft. Bowel sounds are normal. She exhibits no distension and no mass. There is no tenderness. There is no rebound and no guarding.  Musculoskeletal: Normal range of motion. She exhibits no edema or tenderness.  Neurological: She is alert.  Skin: Skin is warm and dry.  Psychiatric: She has a normal mood and affect. Her behavior is normal. Judgment and thought content normal.  Nursing note and vitals reviewed.    ED Treatments / Results  Labs (all labs ordered are listed, but only abnormal results are displayed) Labs Reviewed  CBC WITH DIFFERENTIAL/PLATELET  COMPREHENSIVE METABOLIC PANEL  RAPID URINE DRUG SCREEN, HOSP PERFORMED  ACETAMINOPHEN LEVEL  ETHANOL  SALICYLATE LEVEL  URINALYSIS, ROUTINE W REFLEX MICROSCOPIC    EKG  EKG Interpretation  Date/Time:  Wednesday March 20 2016 06:04:34 EDT Ventricular Rate:  68 PR Interval:    QRS Duration: 81 QT Interval:  457 QTC Calculation: 487 R Axis:   -57 Text Interpretation:  Sinus rhythm Inferior infarct, old Consider anterior infarct No significant change since last tracing Confirmed by HORTON  MD, Toni Amend ([id number]) on 03/20/2016 6:07:17 AM       Radiology No results  found.  Procedures Procedures (including critical care time)  Medications Ordered in ED Medications - No data to display   Initial Impression / Assessment and Plan / ED Course  I have reviewed the triage vital signs and the nursing notes.  Pertinent labs & imaging results that were available during my care of the patient were reviewed by me and considered in my medical decision making (see chart for details).     Patient with AMS.  Seems to be intoxicated, but could be also post-ictal.  No hx of seizures.  Will check CT.  Will get labs and reassess.  Patient refused CT.    Patient observed for several hours. She is now mentating appropriately. I believe her symptoms to be from polysubstance abuse. She requests to be discharged. She has no further complaints. Her laboratory workup and vital signs are reassuring.  Final Clinical Impressions(s) / ED Diagnoses   Final diagnoses:  Polysubstance abuse    New Prescriptions New Prescriptions   No medications on file     Roxy Horseman, PA-C 03/20/16 1114    Shon Baton, MD 03/24/16 2302

## 2016-03-20 NOTE — ED Notes (Signed)
EDP at bedside  

## 2016-03-20 NOTE — ED Notes (Signed)
Pt was asleep when I entered the room.  When I introduced myself pt stated that she needed the bedpan immediately, offered to let her walk to the bathroom to void.  Pt insisted on the bedpan. Placed pt on this so she could void.  D/c IV and gave d/c instructions.

## 2016-03-20 NOTE — ED Notes (Signed)
Pt stating she is claustrophobic and cannot have a CT scan
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
## 2016-03-26 ENCOUNTER — Encounter: Payer: Medicare Other | Admitting: Adult Health

## 2016-05-15 ENCOUNTER — Emergency Department (HOSPITAL_COMMUNITY)
Admission: EM | Admit: 2016-05-15 | Discharge: 2016-05-15 | Disposition: A | Discharge status: Home / Self Care | Payer: Medicare Other | Attending: Dermatology | Admitting: Dermatology

## 2016-05-15 ENCOUNTER — Encounter (HOSPITAL_COMMUNITY): Payer: Self-pay | Admitting: Emergency Medicine

## 2016-05-15 DIAGNOSIS — Z5321 Procedure and treatment not carried out due to patient leaving prior to being seen by health care provider: Secondary | ICD-10-CM | POA: Diagnosis not present

## 2016-05-15 DIAGNOSIS — R42 Dizziness and giddiness: Secondary | ICD-10-CM | POA: Insufficient documentation

## 2016-05-15 NOTE — ED Triage Notes (Signed)
Pt c/o dizziness, pinpoint moving black spots in vision, left great toe tingling onset Monday.

## 2016-07-15 DIAGNOSIS — G894 Chronic pain syndrome: Secondary | ICD-10-CM | POA: Diagnosis not present

## 2016-07-15 DIAGNOSIS — M545 Low back pain: Secondary | ICD-10-CM | POA: Diagnosis not present

## 2016-07-15 DIAGNOSIS — M542 Cervicalgia: Secondary | ICD-10-CM | POA: Diagnosis not present

## 2016-07-15 DIAGNOSIS — M25569 Pain in unspecified knee: Secondary | ICD-10-CM | POA: Diagnosis not present

## 2016-07-15 DIAGNOSIS — Z79891 Long term (current) use of opiate analgesic: Secondary | ICD-10-CM | POA: Diagnosis not present

## 2016-07-24 DIAGNOSIS — M5412 Radiculopathy, cervical region: Secondary | ICD-10-CM | POA: Diagnosis not present

## 2016-07-24 DIAGNOSIS — M50323 Other cervical disc degeneration at C6-C7 level: Secondary | ICD-10-CM | POA: Diagnosis not present

## 2016-07-24 DIAGNOSIS — G8929 Other chronic pain: Secondary | ICD-10-CM | POA: Diagnosis not present

## 2016-07-24 DIAGNOSIS — M25511 Pain in right shoulder: Secondary | ICD-10-CM | POA: Diagnosis not present

## 2016-09-02 DIAGNOSIS — J449 Chronic obstructive pulmonary disease, unspecified: Secondary | ICD-10-CM | POA: Diagnosis not present

## 2016-09-02 DIAGNOSIS — Z720 Tobacco use: Secondary | ICD-10-CM | POA: Diagnosis not present

## 2016-09-02 DIAGNOSIS — K5900 Constipation, unspecified: Secondary | ICD-10-CM | POA: Diagnosis not present

## 2016-09-02 DIAGNOSIS — G894 Chronic pain syndrome: Secondary | ICD-10-CM | POA: Diagnosis not present

## 2016-09-04 DIAGNOSIS — G894 Chronic pain syndrome: Secondary | ICD-10-CM | POA: Diagnosis not present

## 2016-09-04 DIAGNOSIS — M545 Low back pain: Secondary | ICD-10-CM | POA: Diagnosis not present

## 2016-09-04 DIAGNOSIS — M25569 Pain in unspecified knee: Secondary | ICD-10-CM | POA: Diagnosis not present

## 2016-09-04 DIAGNOSIS — M25511 Pain in right shoulder: Secondary | ICD-10-CM | POA: Diagnosis not present

## 2016-09-04 DIAGNOSIS — M5412 Radiculopathy, cervical region: Secondary | ICD-10-CM | POA: Diagnosis not present

## 2016-09-04 DIAGNOSIS — M542 Cervicalgia: Secondary | ICD-10-CM | POA: Diagnosis not present

## 2016-10-03 DIAGNOSIS — M542 Cervicalgia: Secondary | ICD-10-CM | POA: Diagnosis not present

## 2016-10-03 DIAGNOSIS — M25559 Pain in unspecified hip: Secondary | ICD-10-CM | POA: Diagnosis not present

## 2016-10-03 DIAGNOSIS — M545 Low back pain: Secondary | ICD-10-CM | POA: Diagnosis not present

## 2016-10-03 DIAGNOSIS — M25569 Pain in unspecified knee: Secondary | ICD-10-CM | POA: Diagnosis not present

## 2016-10-03 DIAGNOSIS — G894 Chronic pain syndrome: Secondary | ICD-10-CM | POA: Diagnosis not present

## 2016-10-15 DIAGNOSIS — J441 Chronic obstructive pulmonary disease with (acute) exacerbation: Secondary | ICD-10-CM | POA: Diagnosis not present

## 2016-11-01 DIAGNOSIS — E785 Hyperlipidemia, unspecified: Secondary | ICD-10-CM | POA: Diagnosis not present

## 2016-11-01 DIAGNOSIS — Z01118 Encounter for examination of ears and hearing with other abnormal findings: Secondary | ICD-10-CM | POA: Diagnosis not present

## 2016-11-01 DIAGNOSIS — M25569 Pain in unspecified knee: Secondary | ICD-10-CM | POA: Diagnosis not present

## 2016-11-01 DIAGNOSIS — M542 Cervicalgia: Secondary | ICD-10-CM | POA: Diagnosis not present

## 2016-11-01 DIAGNOSIS — E7849 Other hyperlipidemia: Secondary | ICD-10-CM | POA: Diagnosis not present

## 2016-11-01 DIAGNOSIS — Z720 Tobacco use: Secondary | ICD-10-CM | POA: Diagnosis not present

## 2016-11-01 DIAGNOSIS — J449 Chronic obstructive pulmonary disease, unspecified: Secondary | ICD-10-CM | POA: Diagnosis not present

## 2016-11-01 DIAGNOSIS — M5136 Other intervertebral disc degeneration, lumbar region: Secondary | ICD-10-CM | POA: Diagnosis not present

## 2016-11-01 DIAGNOSIS — G894 Chronic pain syndrome: Secondary | ICD-10-CM | POA: Diagnosis not present

## 2016-11-01 DIAGNOSIS — Z0000 Encounter for general adult medical examination without abnormal findings: Secondary | ICD-10-CM | POA: Diagnosis not present

## 2016-11-01 DIAGNOSIS — R7303 Prediabetes: Secondary | ICD-10-CM | POA: Diagnosis not present

## 2016-11-01 DIAGNOSIS — M545 Low back pain: Secondary | ICD-10-CM | POA: Diagnosis not present

## 2016-11-01 DIAGNOSIS — Z2821 Immunization not carried out because of patient refusal: Secondary | ICD-10-CM | POA: Diagnosis not present

## 2016-11-01 DIAGNOSIS — M25559 Pain in unspecified hip: Secondary | ICD-10-CM | POA: Diagnosis not present

## 2016-11-01 DIAGNOSIS — Z5181 Encounter for therapeutic drug level monitoring: Secondary | ICD-10-CM | POA: Diagnosis not present

## 2016-11-07 DIAGNOSIS — Z01118 Encounter for examination of ears and hearing with other abnormal findings: Secondary | ICD-10-CM | POA: Diagnosis not present

## 2016-11-07 DIAGNOSIS — Z720 Tobacco use: Secondary | ICD-10-CM | POA: Diagnosis not present

## 2016-11-07 DIAGNOSIS — Z131 Encounter for screening for diabetes mellitus: Secondary | ICD-10-CM | POA: Diagnosis not present

## 2016-11-07 DIAGNOSIS — H538 Other visual disturbances: Secondary | ICD-10-CM | POA: Diagnosis not present

## 2016-11-07 DIAGNOSIS — E785 Hyperlipidemia, unspecified: Secondary | ICD-10-CM | POA: Diagnosis not present

## 2016-11-07 DIAGNOSIS — Z0000 Encounter for general adult medical examination without abnormal findings: Secondary | ICD-10-CM | POA: Diagnosis not present

## 2016-11-07 DIAGNOSIS — Z136 Encounter for screening for cardiovascular disorders: Secondary | ICD-10-CM | POA: Diagnosis not present

## 2016-11-07 DIAGNOSIS — R7303 Prediabetes: Secondary | ICD-10-CM | POA: Diagnosis not present

## 2016-11-10 ENCOUNTER — Encounter (HOSPITAL_COMMUNITY): Payer: Self-pay

## 2016-11-10 DIAGNOSIS — Z5321 Procedure and treatment not carried out due to patient leaving prior to being seen by health care provider: Secondary | ICD-10-CM | POA: Insufficient documentation

## 2016-11-10 DIAGNOSIS — K5900 Constipation, unspecified: Secondary | ICD-10-CM | POA: Diagnosis present

## 2016-11-10 NOTE — ED Triage Notes (Signed)
Abdominal pain and constipation for 4 days with nausea states is still passing gas no fever noted.

## 2016-11-11 ENCOUNTER — Emergency Department (HOSPITAL_COMMUNITY)
Admission: EM | Admit: 2016-11-11 | Discharge: 2016-11-11 | Disposition: A | Discharge status: Home / Self Care | Payer: Medicare Other | Attending: Emergency Medicine | Admitting: Emergency Medicine

## 2016-11-11 NOTE — ED Notes (Signed)
Pt called from the lobby for lab draw with no response x2

## 2016-11-11 NOTE — ED Notes (Signed)
Pt called from the lobby with no response x3 

## 2016-11-14 ENCOUNTER — Emergency Department (HOSPITAL_COMMUNITY)
Admission: EM | Admit: 2016-11-14 | Discharge: 2016-11-14 | Disposition: A | Discharge status: Home / Self Care | Payer: Medicare Other | Attending: Emergency Medicine | Admitting: Emergency Medicine

## 2016-11-14 ENCOUNTER — Emergency Department (HOSPITAL_COMMUNITY): Payer: Medicare Other

## 2016-11-14 ENCOUNTER — Encounter (HOSPITAL_COMMUNITY): Payer: Self-pay | Admitting: *Deleted

## 2016-11-14 DIAGNOSIS — R109 Unspecified abdominal pain: Secondary | ICD-10-CM | POA: Diagnosis not present

## 2016-11-14 DIAGNOSIS — Z79899 Other long term (current) drug therapy: Secondary | ICD-10-CM | POA: Insufficient documentation

## 2016-11-14 DIAGNOSIS — E785 Hyperlipidemia, unspecified: Secondary | ICD-10-CM | POA: Diagnosis not present

## 2016-11-14 DIAGNOSIS — F17210 Nicotine dependence, cigarettes, uncomplicated: Secondary | ICD-10-CM | POA: Insufficient documentation

## 2016-11-14 DIAGNOSIS — K5900 Constipation, unspecified: Secondary | ICD-10-CM | POA: Diagnosis present

## 2016-11-14 DIAGNOSIS — R140 Abdominal distension (gaseous): Secondary | ICD-10-CM | POA: Diagnosis not present

## 2016-11-14 DIAGNOSIS — J449 Chronic obstructive pulmonary disease, unspecified: Secondary | ICD-10-CM | POA: Diagnosis not present

## 2016-11-14 DIAGNOSIS — M5136 Other intervertebral disc degeneration, lumbar region: Secondary | ICD-10-CM | POA: Diagnosis not present

## 2016-11-14 DIAGNOSIS — R1084 Generalized abdominal pain: Secondary | ICD-10-CM | POA: Insufficient documentation

## 2016-11-14 LAB — LIPASE, BLOOD: LIPASE: 27 U/L (ref 11–51)

## 2016-11-14 LAB — CBC WITH DIFFERENTIAL/PLATELET
Basophils Absolute: 0 10*3/uL (ref 0.0–0.1)
Basophils Relative: 0 %
Eosinophils Absolute: 0.1 10*3/uL (ref 0.0–0.7)
Eosinophils Relative: 1 %
HCT: 46.8 % — ABNORMAL HIGH (ref 36.0–46.0)
Hemoglobin: 15.5 g/dL — ABNORMAL HIGH (ref 12.0–15.0)
Lymphocytes Relative: 40 %
Lymphs Abs: 1.9 10*3/uL (ref 0.7–4.0)
MCH: 28.1 pg (ref 26.0–34.0)
MCHC: 33.1 g/dL (ref 30.0–36.0)
MCV: 84.9 fL (ref 78.0–100.0)
Monocytes Absolute: 0.5 10*3/uL (ref 0.1–1.0)
Monocytes Relative: 10 %
Neutro Abs: 2.3 10*3/uL (ref 1.7–7.7)
Neutrophils Relative %: 49 %
Platelets: 263 10*3/uL (ref 150–400)
RBC: 5.51 MIL/uL — ABNORMAL HIGH (ref 3.87–5.11)
RDW: 13.1 % (ref 11.5–15.5)
WBC: 4.7 10*3/uL (ref 4.0–10.5)

## 2016-11-14 LAB — I-STAT CHEM 8, ED
BUN: 18 mg/dL (ref 6–20)
Calcium, Ion: 1.05 mmol/L — ABNORMAL LOW (ref 1.15–1.40)
Chloride: 102 mmol/L (ref 101–111)
Creatinine, Ser: 1.1 mg/dL — ABNORMAL HIGH (ref 0.44–1.00)
Glucose, Bld: 111 mg/dL — ABNORMAL HIGH (ref 65–99)
HCT: 48 % — ABNORMAL HIGH (ref 36.0–46.0)
Hemoglobin: 16.3 g/dL — ABNORMAL HIGH (ref 12.0–15.0)
Potassium: 4.8 mmol/L (ref 3.5–5.1)
Sodium: 141 mmol/L (ref 135–145)
TCO2: 31 mmol/L (ref 22–32)

## 2016-11-14 LAB — COMPREHENSIVE METABOLIC PANEL
ALT: 14 U/L (ref 14–54)
ANION GAP: 9 (ref 5–15)
AST: 19 U/L (ref 15–41)
Albumin: 4.1 g/dL (ref 3.5–5.0)
Alkaline Phosphatase: 59 U/L (ref 38–126)
BILIRUBIN TOTAL: 0.7 mg/dL (ref 0.3–1.2)
BUN: 15 mg/dL (ref 6–20)
CO2: 28 mmol/L (ref 22–32)
Calcium: 9.2 mg/dL (ref 8.9–10.3)
Chloride: 102 mmol/L (ref 101–111)
Creatinine, Ser: 1.02 mg/dL — ABNORMAL HIGH (ref 0.44–1.00)
GFR calc Af Amer: >60 mL/min (ref >60)
GFR, EST NON AFRICAN AMERICAN: 56 mL/min — AB (ref >60)
Glucose, Bld: 114 mg/dL — ABNORMAL HIGH (ref 65–99)
POTASSIUM: 4.1 mmol/L (ref 3.5–5.1)
Sodium: 139 mmol/L (ref 135–145)
TOTAL PROTEIN: 7.2 g/dL (ref 6.5–8.1)

## 2016-11-14 MED ORDER — IOPAMIDOL (ISOVUE-300) INJECTION 61%
INTRAVENOUS | Status: AC
  Filled 2016-11-14: qty 100

## 2016-11-14 MED ORDER — KETOROLAC TROMETHAMINE 30 MG/ML IJ SOLN
15.0000 mg | Freq: Once | INTRAMUSCULAR | Status: AC
  Administered 2016-11-14: 15 mg via INTRAVENOUS
  Filled 2016-11-14: qty 1

## 2016-11-14 MED ORDER — IOPAMIDOL (ISOVUE-300) INJECTION 61%
INTRAVENOUS | Status: AC
  Filled 2016-11-14: qty 30

## 2016-11-14 MED ORDER — IOPAMIDOL (ISOVUE-300) INJECTION 61%
100.0000 mL | Freq: Once | INTRAVENOUS | Status: AC | PRN
  Administered 2016-11-14: 100 mL via INTRAVENOUS

## 2016-11-14 MED ORDER — SODIUM CHLORIDE 0.9 % IV BOLUS (SEPSIS)
1000.0000 mL | Freq: Once | INTRAVENOUS | Status: AC
  Administered 2016-11-14: 1000 mL via INTRAVENOUS

## 2016-11-14 MED ORDER — GI COCKTAIL ~~LOC~~
30.0000 mL | Freq: Once | ORAL | Status: AC
  Administered 2016-11-14: 30 mL via ORAL
  Filled 2016-11-14: qty 30

## 2016-11-14 NOTE — ED Notes (Signed)
Patient transported to CT 

## 2016-11-14 NOTE — ED Triage Notes (Signed)
Pt complains of constipation for the past 2 weeks. Pt states she has had liquid stool. Pt complains of 9/10 abdominal pain.
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
## 2016-11-14 NOTE — Discharge Instructions (Signed)
TAKE 8 CAPFULS OF MIRALAX IN A 32 OUNCE GATORADE   Also do a fleets enema.  Follow up with your PCP.

## 2016-11-17 DIAGNOSIS — M545 Low back pain: Secondary | ICD-10-CM | POA: Diagnosis not present

## 2016-11-17 DIAGNOSIS — G8929 Other chronic pain: Secondary | ICD-10-CM | POA: Diagnosis not present

## 2016-11-17 DIAGNOSIS — K625 Hemorrhage of anus and rectum: Secondary | ICD-10-CM | POA: Diagnosis not present

## 2016-11-17 DIAGNOSIS — R109 Unspecified abdominal pain: Secondary | ICD-10-CM | POA: Diagnosis not present

## 2016-11-17 DIAGNOSIS — K5900 Constipation, unspecified: Secondary | ICD-10-CM | POA: Diagnosis not present

## 2016-11-17 DIAGNOSIS — K5909 Other constipation: Secondary | ICD-10-CM | POA: Diagnosis not present

## 2016-11-17 DIAGNOSIS — K644 Residual hemorrhoidal skin tags: Secondary | ICD-10-CM | POA: Diagnosis not present

## 2016-11-25 DIAGNOSIS — N281 Cyst of kidney, acquired: Secondary | ICD-10-CM | POA: Diagnosis not present

## 2016-11-25 DIAGNOSIS — R350 Frequency of micturition: Secondary | ICD-10-CM | POA: Diagnosis not present

## 2016-11-25 DIAGNOSIS — M549 Dorsalgia, unspecified: Secondary | ICD-10-CM | POA: Diagnosis not present

## 2016-11-29 DIAGNOSIS — J441 Chronic obstructive pulmonary disease with (acute) exacerbation: Secondary | ICD-10-CM | POA: Diagnosis not present

## 2016-12-03 DIAGNOSIS — M542 Cervicalgia: Secondary | ICD-10-CM | POA: Diagnosis not present

## 2016-12-03 DIAGNOSIS — M25559 Pain in unspecified hip: Secondary | ICD-10-CM | POA: Diagnosis not present

## 2016-12-03 DIAGNOSIS — M25569 Pain in unspecified knee: Secondary | ICD-10-CM | POA: Diagnosis not present

## 2016-12-03 DIAGNOSIS — G894 Chronic pain syndrome: Secondary | ICD-10-CM | POA: Diagnosis not present

## 2016-12-03 DIAGNOSIS — M545 Low back pain: Secondary | ICD-10-CM | POA: Diagnosis not present

## 2017-01-10 DIAGNOSIS — M542 Cervicalgia: Secondary | ICD-10-CM | POA: Diagnosis not present

## 2017-01-10 DIAGNOSIS — M25559 Pain in unspecified hip: Secondary | ICD-10-CM | POA: Diagnosis not present

## 2017-01-10 DIAGNOSIS — G894 Chronic pain syndrome: Secondary | ICD-10-CM | POA: Diagnosis not present

## 2017-01-10 DIAGNOSIS — M25569 Pain in unspecified knee: Secondary | ICD-10-CM | POA: Diagnosis not present

## 2017-01-10 DIAGNOSIS — M545 Low back pain: Secondary | ICD-10-CM | POA: Diagnosis not present

## 2017-01-21 DIAGNOSIS — R05 Cough: Secondary | ICD-10-CM | POA: Diagnosis not present

## 2017-01-21 DIAGNOSIS — R7303 Prediabetes: Secondary | ICD-10-CM | POA: Diagnosis not present

## 2017-01-21 DIAGNOSIS — J449 Chronic obstructive pulmonary disease, unspecified: Secondary | ICD-10-CM | POA: Diagnosis not present

## 2017-02-06 DIAGNOSIS — J441 Chronic obstructive pulmonary disease with (acute) exacerbation: Secondary | ICD-10-CM | POA: Diagnosis not present

## 2017-02-07 DIAGNOSIS — M25559 Pain in unspecified hip: Secondary | ICD-10-CM | POA: Diagnosis not present

## 2017-02-07 DIAGNOSIS — M545 Low back pain: Secondary | ICD-10-CM | POA: Diagnosis not present

## 2017-02-07 DIAGNOSIS — M542 Cervicalgia: Secondary | ICD-10-CM | POA: Diagnosis not present

## 2017-02-07 DIAGNOSIS — G894 Chronic pain syndrome: Secondary | ICD-10-CM | POA: Diagnosis not present

## 2017-02-07 DIAGNOSIS — M25569 Pain in unspecified knee: Secondary | ICD-10-CM | POA: Diagnosis not present

## 2017-03-07 DIAGNOSIS — M545 Low back pain: Secondary | ICD-10-CM | POA: Diagnosis not present

## 2017-03-07 DIAGNOSIS — G894 Chronic pain syndrome: Secondary | ICD-10-CM | POA: Diagnosis not present

## 2017-03-07 DIAGNOSIS — M25559 Pain in unspecified hip: Secondary | ICD-10-CM | POA: Diagnosis not present

## 2017-03-07 DIAGNOSIS — M542 Cervicalgia: Secondary | ICD-10-CM | POA: Diagnosis not present

## 2017-03-07 DIAGNOSIS — M25569 Pain in unspecified knee: Secondary | ICD-10-CM | POA: Diagnosis not present

## 2017-04-02 DIAGNOSIS — M542 Cervicalgia: Secondary | ICD-10-CM | POA: Diagnosis not present

## 2017-04-02 DIAGNOSIS — M25559 Pain in unspecified hip: Secondary | ICD-10-CM | POA: Diagnosis not present

## 2017-04-02 DIAGNOSIS — G894 Chronic pain syndrome: Secondary | ICD-10-CM | POA: Diagnosis not present

## 2017-04-02 DIAGNOSIS — M545 Low back pain: Secondary | ICD-10-CM | POA: Diagnosis not present

## 2017-04-02 DIAGNOSIS — M25569 Pain in unspecified knee: Secondary | ICD-10-CM | POA: Diagnosis not present

## 2017-04-07 DIAGNOSIS — J441 Chronic obstructive pulmonary disease with (acute) exacerbation: Secondary | ICD-10-CM | POA: Diagnosis not present

## 2017-04-21 ENCOUNTER — Other Ambulatory Visit: Payer: Self-pay

## 2017-04-21 ENCOUNTER — Emergency Department (HOSPITAL_COMMUNITY)
Admission: EM | Admit: 2017-04-21 | Discharge: 2017-04-21 | Disposition: A | Discharge status: Home / Self Care | Payer: Medicare Other | Attending: Emergency Medicine | Admitting: Emergency Medicine

## 2017-04-21 ENCOUNTER — Encounter (HOSPITAL_COMMUNITY): Payer: Self-pay | Admitting: Emergency Medicine

## 2017-04-21 DIAGNOSIS — T148XXA Other injury of unspecified body region, initial encounter: Secondary | ICD-10-CM | POA: Diagnosis not present

## 2017-04-21 DIAGNOSIS — M25512 Pain in left shoulder: Secondary | ICD-10-CM | POA: Diagnosis not present

## 2017-04-21 DIAGNOSIS — Z5321 Procedure and treatment not carried out due to patient leaving prior to being seen by health care provider: Secondary | ICD-10-CM | POA: Insufficient documentation

## 2017-04-21 DIAGNOSIS — M25562 Pain in left knee: Secondary | ICD-10-CM | POA: Diagnosis not present
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
## 2017-04-21 NOTE — ED Notes (Signed)
Patient states "I will actually just follow up with my doctor." Ambulatory without difficulty.

## 2017-04-21 NOTE — ED Notes (Signed)
Patient ambulatory back to nurses station stating "my hip is hurting pretty bad I may should stay."
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
## 2017-05-05 DIAGNOSIS — M542 Cervicalgia: Secondary | ICD-10-CM | POA: Diagnosis not present

## 2017-05-05 DIAGNOSIS — M545 Low back pain: Secondary | ICD-10-CM | POA: Diagnosis not present

## 2017-05-05 DIAGNOSIS — G894 Chronic pain syndrome: Secondary | ICD-10-CM | POA: Diagnosis not present

## 2017-05-05 DIAGNOSIS — M25559 Pain in unspecified hip: Secondary | ICD-10-CM | POA: Diagnosis not present

## 2017-05-05 DIAGNOSIS — M25569 Pain in unspecified knee: Secondary | ICD-10-CM | POA: Diagnosis not present

## 2017-06-03 DIAGNOSIS — G894 Chronic pain syndrome: Secondary | ICD-10-CM | POA: Diagnosis not present

## 2017-06-03 DIAGNOSIS — M25559 Pain in unspecified hip: Secondary | ICD-10-CM | POA: Diagnosis not present

## 2017-06-03 DIAGNOSIS — M542 Cervicalgia: Secondary | ICD-10-CM | POA: Diagnosis not present

## 2017-06-03 DIAGNOSIS — M545 Low back pain: Secondary | ICD-10-CM | POA: Diagnosis not present

## 2017-06-03 DIAGNOSIS — M25569 Pain in unspecified knee: Secondary | ICD-10-CM | POA: Diagnosis not present

## 2017-06-17 DIAGNOSIS — J441 Chronic obstructive pulmonary disease with (acute) exacerbation: Secondary | ICD-10-CM | POA: Diagnosis not present

## 2017-06-21 ENCOUNTER — Emergency Department (HOSPITAL_COMMUNITY)
Admission: EM | Admit: 2017-06-21 | Discharge: 2017-06-21 | Disposition: A | Discharge status: Home / Self Care | Payer: Medicare Other | Attending: Emergency Medicine | Admitting: Emergency Medicine

## 2017-06-21 ENCOUNTER — Emergency Department (HOSPITAL_COMMUNITY): Payer: Medicare Other

## 2017-06-21 ENCOUNTER — Encounter (HOSPITAL_COMMUNITY): Payer: Self-pay | Admitting: Emergency Medicine

## 2017-06-21 ENCOUNTER — Other Ambulatory Visit: Payer: Self-pay

## 2017-06-21 DIAGNOSIS — J45909 Unspecified asthma, uncomplicated: Secondary | ICD-10-CM | POA: Insufficient documentation

## 2017-06-21 DIAGNOSIS — F1410 Cocaine abuse, uncomplicated: Secondary | ICD-10-CM | POA: Diagnosis not present

## 2017-06-21 DIAGNOSIS — R109 Unspecified abdominal pain: Secondary | ICD-10-CM

## 2017-06-21 DIAGNOSIS — F17210 Nicotine dependence, cigarettes, uncomplicated: Secondary | ICD-10-CM | POA: Insufficient documentation

## 2017-06-21 DIAGNOSIS — Z79899 Other long term (current) drug therapy: Secondary | ICD-10-CM | POA: Diagnosis not present

## 2017-06-21 DIAGNOSIS — R112 Nausea with vomiting, unspecified: Secondary | ICD-10-CM | POA: Insufficient documentation

## 2017-06-21 DIAGNOSIS — R197 Diarrhea, unspecified: Secondary | ICD-10-CM | POA: Diagnosis not present

## 2017-06-21 DIAGNOSIS — I10 Essential (primary) hypertension: Secondary | ICD-10-CM | POA: Diagnosis not present

## 2017-06-21 DIAGNOSIS — R110 Nausea: Secondary | ICD-10-CM | POA: Diagnosis not present

## 2017-06-21 DIAGNOSIS — R1110 Vomiting, unspecified: Secondary | ICD-10-CM | POA: Diagnosis not present

## 2017-06-21 DIAGNOSIS — R0902 Hypoxemia: Secondary | ICD-10-CM | POA: Diagnosis not present

## 2017-06-21 DIAGNOSIS — R1111 Vomiting without nausea: Secondary | ICD-10-CM | POA: Diagnosis not present

## 2017-06-21 DIAGNOSIS — R1084 Generalized abdominal pain: Secondary | ICD-10-CM | POA: Diagnosis not present

## 2017-06-21 LAB — ETHANOL

## 2017-06-21 LAB — URINALYSIS, ROUTINE W REFLEX MICROSCOPIC
Bacteria, UA: NONE SEEN
Bilirubin Urine: NEGATIVE
Glucose, UA: NEGATIVE mg/dL
Hgb urine dipstick: NEGATIVE
Ketones, ur: 80 mg/dL — AB
Nitrite: NEGATIVE
Protein, ur: 100 mg/dL — AB
Specific Gravity, Urine: 1.027 (ref 1.005–1.030)
pH: 7 (ref 5.0–8.0)

## 2017-06-21 LAB — DIFFERENTIAL
ABS IMMATURE GRANULOCYTES: 0 10*3/uL (ref 0.0–0.1)
Basophils Absolute: 0 10*3/uL (ref 0.0–0.1)
Basophils Relative: 0 %
Eosinophils Absolute: 0 10*3/uL (ref 0.0–0.7)
Eosinophils Relative: 0 %
Immature Granulocytes: 0 %
LYMPHS ABS: 1.3 10*3/uL (ref 0.7–4.0)
Lymphocytes Relative: 17 %
MONOS PCT: 8 %
Monocytes Absolute: 0.6 10*3/uL (ref 0.1–1.0)
NEUTROS ABS: 5.4 10*3/uL (ref 1.7–7.7)
Neutrophils Relative %: 75 %

## 2017-06-21 LAB — RAPID URINE DRUG SCREEN, HOSP PERFORMED
Amphetamines: NOT DETECTED
Benzodiazepines: NOT DETECTED
COCAINE: POSITIVE — AB
OPIATES: NOT DETECTED
Tetrahydrocannabinol: NOT DETECTED

## 2017-06-21 LAB — COMPREHENSIVE METABOLIC PANEL
ALK PHOS: 56 U/L (ref 38–126)
ALT: 19 U/L (ref 14–54)
AST: 28 U/L (ref 15–41)
Albumin: 4.9 g/dL (ref 3.5–5.0)
Anion gap: 14 (ref 5–15)
BILIRUBIN TOTAL: 1.9 mg/dL — AB (ref 0.3–1.2)
BUN: 12 mg/dL (ref 6–20)
CO2: 28 mmol/L (ref 22–32)
CREATININE: 1.01 mg/dL — AB (ref 0.44–1.00)
Calcium: 10.1 mg/dL (ref 8.9–10.3)
Chloride: 97 mmol/L — ABNORMAL LOW (ref 101–111)
GFR calc Af Amer: >60 mL/min (ref >60)
GFR, EST NON AFRICAN AMERICAN: 56 mL/min — AB (ref >60)
Glucose, Bld: 112 mg/dL — ABNORMAL HIGH (ref 65–99)
Potassium: 4.1 mmol/L (ref 3.5–5.1)
Sodium: 139 mmol/L (ref 135–145)
Total Protein: 8.1 g/dL (ref 6.5–8.1)

## 2017-06-21 LAB — CBC
HCT: 51.3 % — ABNORMAL HIGH (ref 36.0–46.0)
Hemoglobin: 16.4 g/dL — ABNORMAL HIGH (ref 12.0–15.0)
MCH: 27.4 pg (ref 26.0–34.0)
MCHC: 32 g/dL (ref 30.0–36.0)
MCV: 85.6 fL (ref 78.0–100.0)
PLATELETS: 341 10*3/uL (ref 150–400)
RBC: 5.99 MIL/uL — ABNORMAL HIGH (ref 3.87–5.11)
RDW: 13.6 % (ref 11.5–15.5)
WBC: 7.5 10*3/uL (ref 4.0–10.5)

## 2017-06-21 LAB — LIPASE, BLOOD: LIPASE: 22 U/L (ref 11–51)

## 2017-06-21 LAB — TROPONIN I

## 2017-06-21 MED ORDER — MORPHINE SULFATE (PF) 4 MG/ML IV SOLN
4.0000 mg | Freq: Once | INTRAVENOUS | Status: AC
  Administered 2017-06-21: 4 mg via INTRAVENOUS
  Filled 2017-06-21: qty 1

## 2017-06-21 MED ORDER — IOHEXOL 300 MG/ML  SOLN
100.0000 mL | Freq: Once | INTRAMUSCULAR | Status: AC | PRN
  Administered 2017-06-21: 100 mL via INTRAVENOUS

## 2017-06-21 MED ORDER — PROMETHAZINE HCL 25 MG/ML IJ SOLN
12.5000 mg | Freq: Once | INTRAMUSCULAR | Status: AC
  Administered 2017-06-21: 12.5 mg via INTRAVENOUS
  Filled 2017-06-21: qty 1

## 2017-06-21 MED ORDER — SODIUM CHLORIDE 0.9 % IV BOLUS
1000.0000 mL | Freq: Once | INTRAVENOUS | Status: AC
  Administered 2017-06-21: 1000 mL via INTRAVENOUS

## 2017-06-21 MED ORDER — ONDANSETRON HCL 4 MG/2ML IJ SOLN
4.0000 mg | Freq: Once | INTRAMUSCULAR | Status: AC
  Administered 2017-06-21: 4 mg via INTRAVENOUS
  Filled 2017-06-21: qty 2

## 2017-06-21 NOTE — ED Triage Notes (Addendum)
Pt to triage via [facility] for generalized abd pain, nausea, and vomiting that started 3 hours ago after using cocaine at 11pm.  Pt with dry heaves on arrival to triage.  [employer] at triage.  Difficulty getting pt to follow commands.  She is turning sideways in wheelchair in fetal position.

## 2017-06-21 NOTE — ED Notes (Signed)
IV team at bedside 

## 2017-06-21 NOTE — Discharge Instructions (Signed)
Avoid drug use.  Eat a bland diet until no further nausea.

## 2017-06-21 NOTE — ED Provider Notes (Signed)
Pt tolerating po's without further vomiting.   Gwyneth Sprout, MD 06/21/17 1046

## 2017-06-21 NOTE — ED Provider Notes (Signed)
[facility] EMERGENCY DEPARTMENT Provider Note   CSN: [id number] Arrival date & time: 06/21/17  0256     History   Chief Complaint Chief Complaint  Patient presents with  . Abdominal Pain    HPI Dawn Lane is a 67 y.o. female.  The history is provided by the patient.  She has history of asthma, emphysema, polysubstance abuse and comes in complaining of generalized abdominal pain and vomiting with onset today.  She will not be anymore specific than that.  She denies fever or chills.  She denies diarrhea.  Nothing makes pain better, nothing makes pain worse.  She is an extremely difficult and evasive historian.  She did admit to cocaine use at triage.  Past Medical History:  Diagnosis Date  . Anxiety 2011  . Arthritis   . Asthma   . Emphysema   . Hemorrhoids   . Polysubstance abuse (HCC) 2011   4 day [facility] admission for this. Cocaine, ETOH, MJA,   . Tobacco use     Patient Active Problem List   Diagnosis Date Noted  . COPD active smoker/ pfts pending 01/04/2016  . Colitis 08/04/2013  . Abdominal pain 08/04/2013  . Nausea, vomiting and diarrhea 08/04/2013  . Cigarette smoker 08/04/2013  . Dyspnea 08/02/2010    Past Surgical History:  Procedure Laterality Date  . COLONOSCOPY N/A 08/07/2013   Procedure: COLONOSCOPY;  Surgeon: Iva Boop, MD;  Location: [facility] ENDOSCOPY;  Service: Endoscopy;  Laterality: N/A;  . INCISION AND DRAINAGE  12/2009   of thrombosed internal hemorrhoid  . TUBAL LIGATION       OB History   None      Home Medications    Prior to Admission medications   Medication Sig Start Date End Date Taking? Authorizing Provider  albuterol (ACCUNEB) 0.63 MG/3ML nebulizer solution Inhale 1 ampule by nebulization every six (6) hours as needed for wheezing.    [provider]  albuterol (PROVENTIL HFA;VENTOLIN HFA) 108 (90 BASE) MCG/ACT inhaler Inhale 2 puffs into the lungs every 6 (six) hours as needed for  wheezing. Patient not taking: Reported on 11/14/2016 09/14/12   Ward, Layla Maw, DO  albuterol (PROVENTIL HFA;VENTOLIN HFA) 108 (90 Base) MCG/ACT inhaler Inhale 2 puffs every 6 (six) hours as needed into the lungs for wheezing or shortness of breath.     [provider]  albuterol (PROVENTIL) (2.5 MG/3ML) 0.083% nebulizer solution Take 3 mLs (2.5 mg total) by nebulization every 4 (four) hours as needed for wheezing or shortness of breath. Patient not taking: Reported on 11/14/2016 04/15/14   Samuel Jester, DO  Ascorbic Acid (VITAMIN C PO) Take 1 tablet daily by mouth.    [provider]  azithromycin (ZITHROMAX) 250 MG tablet Take 2 on day one then 1 daily x 4 days Patient not taking: Reported on 11/14/2016 01/03/16   Nyoka Cowden, MD  bisacodyl (DULCOLAX) 5 MG EC tablet Take 1 tablet (5 mg total) by mouth 2 (two) times daily. Patient not taking: Reported on 11/14/2016 08/07/13   Edsel Petrin, DO  budesonide-formoterol [facility]) 160-4.5 MCG/ACT inhaler Take 2 puffs first thing in am and then another 2 puffs about 12 hours later. Patient not taking: Reported on 11/14/2016 01/03/16   Nyoka Cowden, MD  gabapentin (NEURONTIN) 300 MG capsule Take 300 mg by mouth 2 (two) times daily.    [provider]  gabapentin (NEURONTIN) 600 MG tablet TK 1 T PO QID 11/01/16   [provider]  guaiFENesin-codeine 100-10 MG/5ML syrup Take 5 mLs by mouth every 4 (four) hours as needed for cough. Patient not taking: Reported on 11/14/2016 01/03/16   Nyoka Cowden, MD  meloxicam (MOBIC) 15 MG tablet Take 15 mg by mouth 2 (two) times daily.    [provider]  nicotine (EQ NICOTINE) 21 mg/24hr patch Place 1 patch (21 mg total) onto the skin daily. Patient not taking: Reported on 11/14/2016 01/03/16   Nyoka Cowden, MD  oxyCODONE (ROXICODONE) 15 MG immediate release tablet TK 1 T PO QID PRN P 11/01/16   [provider]  Oxycodone HCl 10 MG TABS TK 1 T PO QID  PRF PAIN 09/04/16   [provider]  oxyCODONE-acetaminophen (PERCOCET/ROXICET) 5-325 MG per tablet Take 1 tablet by mouth 3 (three) times daily as needed for severe pain. Patient not taking: Reported on 11/14/2016 08/06/13   Edsel Petrin, DO  predniSONE (DELTASONE) 10 MG tablet Take  4 each am x 2 days,   2 each am x 2 days,  1 each am x 2 days and stop Patient not taking: Reported on 11/14/2016 01/03/16   Nyoka Cowden, MD  thiamine (VITAMIN B-1) 100 MG tablet Take 100 mg daily by mouth.    [provider]  tiZANidine (ZANAFLEX) 2 MG tablet Take 2 mg by mouth every 6 (six) hours as needed for muscle spasms.    [provider]  traMADol (ULTRAM) 50 MG tablet Take 100 mg every 6 (six) hours as needed by mouth for moderate pain.  10/16/15   [provider]  Vitamin D, Ergocalciferol, (DRISDOL) 50000 units CAPS capsule TK 1 C PO ONCE WEEKLY 11/04/16   [provider]    Family History Family History  Problem Relation Age of Onset  . Emphysema Father   . Lung cancer Father        was a smoker  . Heart disease Father   . Prostate cancer Father   . Asthma Sister   . Uterine cancer Mother     Social History Social History   Tobacco Use  . Smoking status: Current Every Day Smoker    Packs/day: 0.50    Years: 50.00    Pack years: 25.00    Types: Cigarettes  . Smokeless tobacco: Never Used  Substance Use Topics  . Alcohol use: Yes    Comment: BAC .20  . Drug use: Yes    Frequency: 7.0 times per week    Types: Marijuana, Cocaine, "Crack" cocaine, Benzodiazepines    Comment: UDS + Cocaine     Allergies   Lorazepam; Advil [ibuprofen]; and Vicodin [hydrocodone-acetaminophen]   Review of Systems Review of Systems  All other systems reviewed and are negative.    Physical Exam Updated Vital Signs BP 125/85 (BP Location: Right Arm)   Pulse 84   Temp 99.2 F (37.3 C) (Oral)   Resp 18   SpO2 93%   Physical Exam  Nursing note  and vitals reviewed.  67 year old female, resting comfortably and in no acute distress. Vital signs are normal. Oxygen saturation is 93%, which is normal. Head is normocephalic and atraumatic. PERRLA, EOMI. Oropharynx is clear. Neck is nontender and supple without adenopathy or JVD. Back is nontender and there is no CVA tenderness. Lungs are clear without rales, wheezes, or rhonchi. Chest is nontender. Heart has regular rate and rhythm without murmur. Abdomen is soft, flat, with moderate tenderness diffusely.  There is no rebound or guarding.  There are  no masses or hepatosplenomegaly and peristalsis is hypoactive. Extremities have no cyanosis or edema, full range of motion is present. Skin is warm and dry without rash. Neurologic: Mental status is normal, cranial nerves are intact, there are no motor or sensory deficits.  ED Treatments / Results  Labs (all labs ordered are listed, but only abnormal results are displayed) Labs Reviewed  COMPREHENSIVE METABOLIC PANEL - Abnormal; Notable for the following components:      Result Value   Chloride 97 (*)    Glucose, Bld 112 (*)    Creatinine, Ser 1.01 (*)    Total Bilirubin 1.9 (*)    GFR calc non Af Amer 56 (*)    All other components within normal limits  CBC - Abnormal; Notable for the following components:   RBC 5.99 (*)    Hemoglobin 16.4 (*)    HCT 51.3 (*)    All other components within normal limits  URINALYSIS, ROUTINE W REFLEX MICROSCOPIC - Abnormal; Notable for the following components:   APPearance HAZY (*)    Ketones, ur 80 (*)    Protein, ur 100 (*)    Leukocytes, UA TRACE (*)    All other components within normal limits  RAPID URINE DRUG SCREEN, HOSP PERFORMED - Abnormal; Notable for the following components:   Cocaine POSITIVE (*)    Barbiturates   (*)    Value: Result not available. Reagent lot number recalled by manufacturer.   All other components within normal limits  LIPASE, BLOOD  TROPONIN I  ETHANOL   DIFFERENTIAL    EKG EKG Interpretation  Date/Time:  Saturday June 21 2017 03:13:26 EDT Ventricular Rate:  58 PR Interval:  158 QRS Duration: 76 QT Interval:  434 QTC Calculation: 426 R Axis:   -61 Text Interpretation:  Sinus bradycardia Left anterior fascicular block Anterior infarct , age undetermined Abnormal ECG When compared with ECG of 03/20/2016, No significant change was found Confirmed by Dione Booze ([id number]) on 06/21/2017 4:20:21 AM   Radiology Ct Abdomen Pelvis W Contrast  Result Date: 06/21/2017 CLINICAL DATA:  Abdominal pain, nausea, vomiting, diarrhea EXAM: CT ABDOMEN AND PELVIS WITH CONTRAST TECHNIQUE: Multidetector CT imaging of the abdomen and pelvis was performed using the standard protocol following bolus administration of intravenous contrast. CONTRAST:  OMNIPAQUE IOHEXOL 300 MG/ML  SOLN COMPARISON:  11/14/2016 FINDINGS: Lower chest: Lung bases are clear. No effusions. Heart is normal size. Hepatobiliary: Suspect mild fatty infiltration of the liver. No focal abnormality. Gallbladder unremarkable. Pancreas: No focal abnormality or ductal dilatation. Spleen: No focal abnormality.  Normal size. Adrenals/Urinary Tract: 3.7 cm cyst in the midpole of the right kidney, unchanged. No hydronephrosis. Adrenal glands and urinary bladder unremarkable. Stomach/Bowel: Stomach, large and small bowel grossly unremarkable. Vascular/Lymphatic: Aortic atherosclerosis. No enlarged abdominal or pelvic lymph nodes. Reproductive: Calcified fibroids in the uterus.  No adnexal masses. Other: No free fluid or free air. Musculoskeletal: No acute bony abnormality. IMPRESSION: Suspect mild fatty infiltration of the liver. No acute findings in the abdomen or pelvis. Calcified uterine fibroids. Electronically Signed   By: Charlett Nose M.D.   On: 06/21/2017 07:42    Procedures Procedures  Medications Ordered in ED Medications  sodium chloride 0.9 % bolus 1,000 mL (has no administration in time  range)  ondansetron (ZOFRAN) injection 4 mg (has no administration in time range)  morphine 4 MG/ML injection 4 mg (has no administration in time range)     Initial Impression / Assessment and Plan / ED Course  I have reviewed the triage vital signs and the nursing notes.  Pertinent labs & imaging results that were available during my care of the patient were reviewed by me and considered in my medical decision making (see chart for details).  Abdominal pain with vomiting.  Exam is fairly benign, but I am uncomfortable with her history of a drug use and her lack of willingness to be forthright on history.  Screening labs are unremarkable.  Will check drug screen and sent for CT of abdomen and pelvis.  Old records are reviewed, and she has prior ED visits for abdominal pain and polysubstance abuse.  CT of abdomen and pelvis shows no acute process.  Patient still complaining of abdominal pain and nausea in spite of IV fluids, morphine, ondansetron.  She will be given additional IV fluids, morphine and promethazine.  Case is signed out to Dr. Anitra Lauth.  Final Clinical Impressions(s) / ED Diagnoses   Final diagnoses:  Abdominal pain, unspecified abdominal location  Non-intractable vomiting with nausea, unspecified vomiting type  Cocaine abuse [facility])    ED Discharge Orders    None       Dione Booze, MD 06/21/17 [phone]

## 2017-06-21 NOTE — ED Notes (Signed)
ED Provider at bedside. 

## 2017-06-30 DIAGNOSIS — M25559 Pain in unspecified hip: Secondary | ICD-10-CM | POA: Diagnosis not present

## 2017-06-30 DIAGNOSIS — M545 Low back pain: Secondary | ICD-10-CM | POA: Diagnosis not present

## 2017-06-30 DIAGNOSIS — G894 Chronic pain syndrome: Secondary | ICD-10-CM | POA: Diagnosis not present

## 2017-06-30 DIAGNOSIS — M25569 Pain in unspecified knee: Secondary | ICD-10-CM | POA: Diagnosis not present

## 2017-06-30 DIAGNOSIS — M542 Cervicalgia: Secondary | ICD-10-CM | POA: Diagnosis not present

## 2017-07-04 DIAGNOSIS — R109 Unspecified abdominal pain: Secondary | ICD-10-CM | POA: Diagnosis not present

## 2017-07-04 DIAGNOSIS — N281 Cyst of kidney, acquired: Secondary | ICD-10-CM | POA: Diagnosis not present

## 2017-07-15 ENCOUNTER — Other Ambulatory Visit: Payer: Self-pay

## 2017-07-15 ENCOUNTER — Emergency Department (HOSPITAL_COMMUNITY)
Admission: EM | Admit: 2017-07-15 | Discharge: 2017-07-15 | Disposition: A | Discharge status: Home / Self Care | Payer: Medicare Other | Attending: Emergency Medicine | Admitting: Emergency Medicine

## 2017-07-15 ENCOUNTER — Encounter (HOSPITAL_COMMUNITY): Payer: Self-pay | Admitting: Emergency Medicine

## 2017-07-15 DIAGNOSIS — F17210 Nicotine dependence, cigarettes, uncomplicated: Secondary | ICD-10-CM | POA: Diagnosis not present

## 2017-07-15 DIAGNOSIS — J45909 Unspecified asthma, uncomplicated: Secondary | ICD-10-CM | POA: Diagnosis not present

## 2017-07-15 DIAGNOSIS — R197 Diarrhea, unspecified: Secondary | ICD-10-CM | POA: Insufficient documentation

## 2017-07-15 DIAGNOSIS — R112 Nausea with vomiting, unspecified: Secondary | ICD-10-CM | POA: Diagnosis not present

## 2017-07-15 DIAGNOSIS — Z79899 Other long term (current) drug therapy: Secondary | ICD-10-CM | POA: Insufficient documentation

## 2017-07-15 DIAGNOSIS — R110 Nausea: Secondary | ICD-10-CM | POA: Diagnosis not present

## 2017-07-15 DIAGNOSIS — R1111 Vomiting without nausea: Secondary | ICD-10-CM | POA: Diagnosis not present

## 2017-07-15 DIAGNOSIS — R1084 Generalized abdominal pain: Secondary | ICD-10-CM | POA: Diagnosis not present

## 2017-07-15 LAB — COMPREHENSIVE METABOLIC PANEL
ALT: 14 U/L (ref 0–44)
AST: 18 U/L (ref 15–41)
Albumin: 4.2 g/dL (ref 3.5–5.0)
Alkaline Phosphatase: 51 U/L (ref 38–126)
Anion gap: 7 (ref 5–15)
BUN: 14 mg/dL (ref 8–23)
CALCIUM: 9.5 mg/dL (ref 8.9–10.3)
CO2: 31 mmol/L (ref 22–32)
Chloride: 106 mmol/L (ref 98–111)
Creatinine, Ser: 0.96 mg/dL (ref 0.44–1.00)
GFR calc non Af Amer: 59 mL/min — ABNORMAL LOW (ref >60)
GLUCOSE: 120 mg/dL — AB (ref 70–99)
Potassium: 4.3 mmol/L (ref 3.5–5.1)
SODIUM: 144 mmol/L (ref 135–145)
TOTAL PROTEIN: 7.2 g/dL (ref 6.5–8.1)
Total Bilirubin: 0.6 mg/dL (ref 0.3–1.2)

## 2017-07-15 LAB — CBC
HCT: 46.6 % — ABNORMAL HIGH (ref 36.0–46.0)
HEMOGLOBIN: 15.1 g/dL — AB (ref 12.0–15.0)
MCH: 28.1 pg (ref 26.0–34.0)
MCHC: 32.4 g/dL (ref 30.0–36.0)
MCV: 86.6 fL (ref 78.0–100.0)
Platelets: 280 10*3/uL (ref 150–400)
RBC: 5.38 MIL/uL — ABNORMAL HIGH (ref 3.87–5.11)
RDW: 13.5 % (ref 11.5–15.5)
WBC: 5.3 10*3/uL (ref 4.0–10.5)

## 2017-07-15 LAB — LIPASE, BLOOD: Lipase: 25 U/L (ref 11–51)

## 2017-07-15 MED ORDER — ONDANSETRON HCL 4 MG PO TABS: 4.0000 mg | ORAL_TABLET | Freq: Three times a day (TID) | ORAL | 0 refills | Status: DC | PRN

## 2017-07-15 MED ORDER — ONDANSETRON 4 MG PO TBDP: 4.0000 mg | ORAL_TABLET | Freq: Once | ORAL | Status: DC | PRN

## 2017-07-15 MED ORDER — ONDANSETRON 4 MG PO TBDP
4.0000 mg | ORAL_TABLET | Freq: Once | ORAL | Status: AC
  Administered 2017-07-15: 4 mg via ORAL
  Filled 2017-07-15: qty 1

## 2017-07-15 NOTE — Discharge Instructions (Signed)
Make sure you are staying well hydrated.  Use zofran as needed for nausea or vomiting.  Continue taking your home medications.  Symptoms will likely continue for several days before improving. Return to the ER if you develop blood in your stool, persistent high fevers, or any new or concerning symptoms.

## 2017-07-15 NOTE — ED Provider Notes (Signed)
[facility]-EMERGENCY DEPT Provider Note   CSN: [id number] Arrival date & time: 07/15/17  0243     History   Chief Complaint Chief Complaint  Patient presents with  . Abdominal Pain    HPI Dawn Lane is a 67 y.o. female presenting for evaluation of nausea, vomiting, diarrhea.  Pt states that she ate dinner last night at 8 PM, and at 10 PM started to develop nausea, vomiting, and diarrhea.  She has had multiple episodes of nonbloody nonbilious emesis, and nonbloody stool.  She reports intermittent abdominal cramping, none at this time.  Symptoms have improved since she was given Zofran with EMS.  She has not had anything for pain.  She denies fevers, chills, chest pain, shortness of breath, urinary symptoms.  She denies sick contacts, including her husband who ate at the same restaurant. She reports social alcohol use, 2 drinks last night. Denies drug use. PMH significant for substance abuse, anxiety, COPD, colitis. She is on chronic pain medication for arthritis.   HPI  Past Medical History:  Diagnosis Date  . Anxiety 2011  . Arthritis   . Asthma   . Emphysema   . Hemorrhoids   . Polysubstance abuse (HCC) 2011   4 day [facility] admission for this. Cocaine, ETOH, MJA,   . Tobacco use     Patient Active Problem List   Diagnosis Date Noted  . COPD active smoker/ pfts pending 01/04/2016  . Colitis 08/04/2013  . Abdominal pain 08/04/2013  . Nausea, vomiting and diarrhea 08/04/2013  . Cigarette smoker 08/04/2013  . Dyspnea 08/02/2010    Past Surgical History:  Procedure Laterality Date  . COLONOSCOPY N/A 08/07/2013   Procedure: COLONOSCOPY;  Surgeon: Iva Boop, MD;  Location: [facility] ENDOSCOPY;  Service: Endoscopy;  Laterality: N/A;  . INCISION AND DRAINAGE  12/2009   of thrombosed internal hemorrhoid  . TUBAL LIGATION       OB History   None      Home Medications    Prior to Admission medications   Medication Sig Start Date End Date  Taking? Authorizing Provider  albuterol (PROVENTIL HFA;VENTOLIN HFA) 108 (90 BASE) MCG/ACT inhaler Inhale 2 puffs into the lungs every 6 (six) hours as needed for wheezing. 09/14/12  Yes Ward, Kristen N, DO  albuterol (PROVENTIL) (2.5 MG/3ML) 0.083% nebulizer solution Take 3 mLs (2.5 mg total) by nebulization every 4 (four) hours as needed for wheezing or shortness of breath. 04/15/14  Yes Samuel Jester, DO  Ascorbic Acid (VITAMIN C PO) Take 1 tablet daily by mouth.   Yes [provider]  gabapentin (NEURONTIN) 600 MG tablet Take 600 mg by mouth 4 (four) times daily.   Yes [provider]  meloxicam (MOBIC) 15 MG tablet Take 15 mg by mouth 2 (two) times daily.   Yes [provider]  Oxycodone HCl 20 MG TABS Take 20 mg by mouth 4 (four) times daily.   Yes [provider]  senna (SENOKOT) 8.6 MG tablet Take 1 tablet by mouth daily.   Yes [provider]  Vitamin D, Ergocalciferol, (DRISDOL) 50000 units CAPS capsule TK 1 C PO ONCE WEEKLY 11/04/16  Yes [provider]  azithromycin (ZITHROMAX) 250 MG tablet Take 2 on day one then 1 daily x 4 days Patient not taking: Reported on 11/14/2016 01/03/16   Nyoka Cowden, MD  bisacodyl (DULCOLAX) 5 MG EC tablet Take 1 tablet (5 mg total) by mouth 2 (two) times daily. Patient not taking: Reported  on 11/14/2016 08/07/13   Edsel Petrin, DO  budesonide-formoterol (SYMBICORT) 160-4.5 MCG/ACT inhaler Take 2 puffs first thing in am and then another 2 puffs about 12 hours later. Patient not taking: Reported on 11/14/2016 01/03/16   Nyoka Cowden, MD  guaiFENesin-codeine 100-10 MG/5ML syrup Take 5 mLs by mouth every 4 (four) hours as needed for cough. Patient not taking: Reported on 11/14/2016 01/03/16   Nyoka Cowden, MD  nicotine (EQ NICOTINE) 21 mg/24hr patch Place 1 patch (21 mg total) onto the skin daily. Patient not taking: Reported on 11/14/2016 01/03/16   Nyoka Cowden, MD  ondansetron (ZOFRAN) 4 MG  tablet Take 1 tablet (4 mg total) by mouth every 8 (eight) hours as needed for nausea or vomiting. 07/15/17   Tam Savoia, PA-C  oxyCODONE-acetaminophen (PERCOCET/ROXICET) 5-325 MG per tablet Take 1 tablet by mouth 3 (three) times daily as needed for severe pain. Patient not taking: Reported on 11/14/2016 08/06/13   Edsel Petrin, DO  predniSONE (DELTASONE) 10 MG tablet Take  4 each am x 2 days,   2 each am x 2 days,  1 each am x 2 days and stop Patient not taking: Reported on 11/14/2016 01/03/16   Nyoka Cowden, MD    Family History Family History  Problem Relation Age of Onset  . Emphysema Father   . Lung cancer Father        was a smoker  . Heart disease Father   . Prostate cancer Father   . Asthma Sister   . Uterine cancer Mother     Social History Social History   Tobacco Use  . Smoking status: Current Every Day Smoker    Packs/day: 0.50    Years: 50.00    Pack years: 25.00    Types: Cigarettes  . Smokeless tobacco: Never Used  Substance Use Topics  . Alcohol use: Yes    Comment: BAC .20  . Drug use: Yes    Frequency: 7.0 times per week    Types: Marijuana, Cocaine, "Crack" cocaine, Benzodiazepines    Comment: UDS + Cocaine     Allergies   Lorazepam; Advil [ibuprofen]; and Vicodin [hydrocodone-acetaminophen]   Review of Systems Review of Systems  Gastrointestinal: Positive for abdominal pain, diarrhea, nausea and vomiting.  All other systems reviewed and are negative.    Physical Exam Updated Vital Signs BP 118/76   Pulse 68   Temp 98 F (36.7 C) (Oral)   Resp 18   Ht 5\' 6"  (1.676 m)   Wt 64.4 kg (142 lb)   SpO2 98%   BMI 22.92 kg/m   Physical Exam  Constitutional: She is oriented to person, place, and time. She appears well-developed and well-nourished. No distress.  Appears nontoxic.   HENT:  Head: Normocephalic and atraumatic.  MM moist  Eyes: Pupils are equal, round, and reactive to light. Conjunctivae and EOM are normal.  Neck:  Normal range of motion. Neck supple.  Cardiovascular: Normal rate, regular rhythm and intact distal pulses.  Pulmonary/Chest: Effort normal and breath sounds normal. No respiratory distress. She has no wheezes.  Abdominal: Soft. Bowel sounds are normal. She exhibits no distension and no mass. There is no tenderness. There is no rebound and no guarding.  No tenderness palpation of the abdomen.  Soft without rigidity, guarding, distention.  Musculoskeletal: Normal range of motion.  Neurological: She is alert and oriented to person, place, and time.  Skin: Skin is warm and dry. Capillary refill takes less than 2 seconds.  Psychiatric: She has a normal mood and affect.  Nursing note and vitals reviewed.    ED Treatments / Results  Labs (all labs ordered are listed, but only abnormal results are displayed) Labs Reviewed  COMPREHENSIVE METABOLIC PANEL - Abnormal; Notable for the following components:      Result Value   Glucose, Bld 120 (*)    GFR calc non Af Amer 59 (*)    All other components within normal limits  CBC - Abnormal; Notable for the following components:   RBC 5.38 (*)    Hemoglobin 15.1 (*)    HCT 46.6 (*)    All other components within normal limits  LIPASE, BLOOD    EKG None  Radiology No results found.  Procedures Procedures (including critical care time)  Medications Ordered in ED Medications  ondansetron (ZOFRAN-ODT) disintegrating tablet 4 mg (4 mg Oral Given 07/15/17 5643)     Initial Impression / Assessment and Plan / ED Course  I have reviewed the triage vital signs and the nursing notes.  Pertinent labs & imaging results that were available during my care of the patient were reviewed by me and considered in my medical decision making (see chart for details).     Presenting for evaluation of nausea, vomiting, and diarrhea.  Physical exam reassuring, she is afebrile not tachycardic.  Appears nontoxic.  Visual labs reassuring, no leukocytosis.   Hemoglobin slightly elevated, likely patient is slightly dehydrated.  Creatinine stable.  Electrolytes stable.  Lipase negative.  Symptoms improved with Zofran given by EMS.  Patient states that she wants to go home so she can take her home pain medication.  Offered p.o. challenge and further symptom control in the ER, patient declined.  Will give another dose of Zofran and discharged with Zofran and encouragement of hydration.  At this time, doubt intra-abdominal infection, perforation, obstruction, or surgical abdomen.  Patient to follow-up with primary care as needed.  At this time, patient proceed for discharge.  Return precautions given.  Patient states she understands and agrees to plan.   Final Clinical Impressions(s) / ED Diagnoses   Final diagnoses:  Nausea vomiting and diarrhea    ED Discharge Orders        Ordered    ondansetron (ZOFRAN) 4 MG tablet  Every 8 hours PRN     07/15/17 0706       Alveria Apley, PA-C 07/15/17 1130    Palumbo, April, MD 07/15/17 2323

## 2017-07-15 NOTE — ED Triage Notes (Signed)
Pt presents by Dawn Lane for abdominal pain that started after eating at a restaurant. Pt reports improvement in vomiting after being given Zofran by EMS.

## 2017-07-26 ENCOUNTER — Emergency Department (HOSPITAL_COMMUNITY): Payer: Medicare Other

## 2017-07-26 ENCOUNTER — Other Ambulatory Visit: Payer: Self-pay

## 2017-07-26 ENCOUNTER — Emergency Department (HOSPITAL_COMMUNITY)
Admission: EM | Admit: 2017-07-26 | Discharge: 2017-07-26 | Disposition: A | Discharge status: Home / Self Care | Payer: Medicare Other | Attending: Emergency Medicine | Admitting: Emergency Medicine

## 2017-07-26 DIAGNOSIS — F1410 Cocaine abuse, uncomplicated: Secondary | ICD-10-CM | POA: Diagnosis not present

## 2017-07-26 DIAGNOSIS — S20211A Contusion of right front wall of thorax, initial encounter: Secondary | ICD-10-CM | POA: Diagnosis not present

## 2017-07-26 DIAGNOSIS — Y92810 Car as the place of occurrence of the external cause: Secondary | ICD-10-CM | POA: Diagnosis not present

## 2017-07-26 DIAGNOSIS — S299XXA Unspecified injury of thorax, initial encounter: Secondary | ICD-10-CM | POA: Diagnosis not present

## 2017-07-26 DIAGNOSIS — R0602 Shortness of breath: Secondary | ICD-10-CM | POA: Diagnosis not present

## 2017-07-26 DIAGNOSIS — Y9389 Activity, other specified: Secondary | ICD-10-CM | POA: Diagnosis not present

## 2017-07-26 DIAGNOSIS — J449 Chronic obstructive pulmonary disease, unspecified: Secondary | ICD-10-CM | POA: Insufficient documentation

## 2017-07-26 DIAGNOSIS — Y998 Other external cause status: Secondary | ICD-10-CM | POA: Insufficient documentation

## 2017-07-26 DIAGNOSIS — R0781 Pleurodynia: Secondary | ICD-10-CM | POA: Diagnosis not present

## 2017-07-26 DIAGNOSIS — R079 Chest pain, unspecified: Secondary | ICD-10-CM | POA: Diagnosis not present

## 2017-07-26 DIAGNOSIS — F17210 Nicotine dependence, cigarettes, uncomplicated: Secondary | ICD-10-CM | POA: Insufficient documentation

## 2017-07-26 DIAGNOSIS — Z79899 Other long term (current) drug therapy: Secondary | ICD-10-CM | POA: Insufficient documentation

## 2017-07-26 DIAGNOSIS — F1210 Cannabis abuse, uncomplicated: Secondary | ICD-10-CM | POA: Insufficient documentation

## 2017-07-26 DIAGNOSIS — W228XXA Striking against or struck by other objects, initial encounter: Secondary | ICD-10-CM | POA: Diagnosis not present

## 2017-07-26 DIAGNOSIS — F1310 Sedative, hypnotic or anxiolytic abuse, uncomplicated: Secondary | ICD-10-CM | POA: Insufficient documentation

## 2017-07-26 MED ORDER — OXYCODONE HCL 5 MG PO TABS
20.0000 mg | ORAL_TABLET | Freq: Once | ORAL | Status: AC
  Administered 2017-07-26: 20 mg via ORAL
  Filled 2017-07-26: qty 4
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
## 2017-07-26 NOTE — ED Triage Notes (Signed)
Pt reports pain to her right ribs after leaning over console of her trunk yesterday and had asharp pain at that time. Pain has worsen since then

## 2017-07-29 DIAGNOSIS — J441 Chronic obstructive pulmonary disease with (acute) exacerbation: Secondary | ICD-10-CM | POA: Diagnosis not present

## 2017-08-04 DIAGNOSIS — M25559 Pain in unspecified hip: Secondary | ICD-10-CM | POA: Diagnosis not present

## 2017-08-04 DIAGNOSIS — M25569 Pain in unspecified knee: Secondary | ICD-10-CM | POA: Diagnosis not present

## 2017-08-04 DIAGNOSIS — G894 Chronic pain syndrome: Secondary | ICD-10-CM | POA: Diagnosis not present

## 2017-08-04 DIAGNOSIS — M545 Low back pain: Secondary | ICD-10-CM | POA: Diagnosis not present

## 2017-08-04 DIAGNOSIS — M542 Cervicalgia: Secondary | ICD-10-CM | POA: Diagnosis not present

## 2017-08-11 DIAGNOSIS — J449 Chronic obstructive pulmonary disease, unspecified: Secondary | ICD-10-CM | POA: Diagnosis not present

## 2017-08-11 DIAGNOSIS — Z720 Tobacco use: Secondary | ICD-10-CM | POA: Diagnosis not present

## 2017-08-11 DIAGNOSIS — Z136 Encounter for screening for cardiovascular disorders: Secondary | ICD-10-CM | POA: Diagnosis not present

## 2017-08-11 DIAGNOSIS — Z0000 Encounter for general adult medical examination without abnormal findings: Secondary | ICD-10-CM | POA: Diagnosis not present

## 2017-08-11 DIAGNOSIS — Z0110 Encounter for examination of ears and hearing without abnormal findings: Secondary | ICD-10-CM | POA: Diagnosis not present

## 2017-08-11 DIAGNOSIS — Z131 Encounter for screening for diabetes mellitus: Secondary | ICD-10-CM | POA: Diagnosis not present

## 2017-08-27 DIAGNOSIS — Z1231 Encounter for screening mammogram for malignant neoplasm of breast: Secondary | ICD-10-CM | POA: Diagnosis not present

## 2017-09-01 DIAGNOSIS — M25569 Pain in unspecified knee: Secondary | ICD-10-CM | POA: Diagnosis not present

## 2017-09-01 DIAGNOSIS — M545 Low back pain: Secondary | ICD-10-CM | POA: Diagnosis not present

## 2017-09-01 DIAGNOSIS — M25559 Pain in unspecified hip: Secondary | ICD-10-CM | POA: Diagnosis not present

## 2017-09-01 DIAGNOSIS — M542 Cervicalgia: Secondary | ICD-10-CM | POA: Diagnosis not present

## 2017-09-01 DIAGNOSIS — G894 Chronic pain syndrome: Secondary | ICD-10-CM | POA: Diagnosis not present

## 2017-09-29 DIAGNOSIS — M25569 Pain in unspecified knee: Secondary | ICD-10-CM | POA: Diagnosis not present

## 2017-09-29 DIAGNOSIS — M545 Low back pain: Secondary | ICD-10-CM | POA: Diagnosis not present

## 2017-09-29 DIAGNOSIS — M542 Cervicalgia: Secondary | ICD-10-CM | POA: Diagnosis not present

## 2017-09-29 DIAGNOSIS — G894 Chronic pain syndrome: Secondary | ICD-10-CM | POA: Diagnosis not present

## 2017-09-29 DIAGNOSIS — M25559 Pain in unspecified hip: Secondary | ICD-10-CM | POA: Diagnosis not present

## 2017-10-27 DIAGNOSIS — G894 Chronic pain syndrome: Secondary | ICD-10-CM | POA: Diagnosis not present

## 2017-10-27 DIAGNOSIS — M542 Cervicalgia: Secondary | ICD-10-CM | POA: Diagnosis not present

## 2017-10-27 DIAGNOSIS — M25559 Pain in unspecified hip: Secondary | ICD-10-CM | POA: Diagnosis not present

## 2017-10-27 DIAGNOSIS — M25569 Pain in unspecified knee: Secondary | ICD-10-CM | POA: Diagnosis not present

## 2017-10-27 DIAGNOSIS — M545 Low back pain: Secondary | ICD-10-CM | POA: Diagnosis not present

## 2017-11-04 DIAGNOSIS — M25561 Pain in right knee: Secondary | ICD-10-CM | POA: Diagnosis not present

## 2017-11-04 DIAGNOSIS — M25512 Pain in left shoulder: Secondary | ICD-10-CM | POA: Diagnosis not present

## 2017-11-04 DIAGNOSIS — M25562 Pain in left knee: Secondary | ICD-10-CM | POA: Diagnosis not present

## 2017-11-04 DIAGNOSIS — Z79899 Other long term (current) drug therapy: Secondary | ICD-10-CM | POA: Diagnosis not present

## 2017-11-04 DIAGNOSIS — E559 Vitamin D deficiency, unspecified: Secondary | ICD-10-CM | POA: Diagnosis not present

## 2017-11-04 DIAGNOSIS — M25559 Pain in unspecified hip: Secondary | ICD-10-CM | POA: Diagnosis not present

## 2017-11-11 DIAGNOSIS — Z79899 Other long term (current) drug therapy: Secondary | ICD-10-CM | POA: Diagnosis not present

## 2017-11-11 DIAGNOSIS — E559 Vitamin D deficiency, unspecified: Secondary | ICD-10-CM | POA: Diagnosis not present

## 2017-11-11 DIAGNOSIS — I700 Atherosclerosis of aorta: Secondary | ICD-10-CM | POA: Diagnosis not present

## 2017-11-12 DIAGNOSIS — M19011 Primary osteoarthritis, right shoulder: Secondary | ICD-10-CM | POA: Diagnosis not present

## 2017-11-12 DIAGNOSIS — M19012 Primary osteoarthritis, left shoulder: Secondary | ICD-10-CM | POA: Diagnosis not present

## 2017-11-14 DIAGNOSIS — M25552 Pain in left hip: Secondary | ICD-10-CM | POA: Diagnosis not present

## 2017-11-14 DIAGNOSIS — M25562 Pain in left knee: Secondary | ICD-10-CM | POA: Diagnosis not present

## 2017-11-24 DIAGNOSIS — Z79891 Long term (current) use of opiate analgesic: Secondary | ICD-10-CM | POA: Diagnosis not present

## 2017-11-24 DIAGNOSIS — M25559 Pain in unspecified hip: Secondary | ICD-10-CM | POA: Diagnosis not present

## 2017-11-24 DIAGNOSIS — M545 Low back pain: Secondary | ICD-10-CM | POA: Diagnosis not present

## 2017-11-24 DIAGNOSIS — M25569 Pain in unspecified knee: Secondary | ICD-10-CM | POA: Diagnosis not present

## 2017-11-24 DIAGNOSIS — G894 Chronic pain syndrome: Secondary | ICD-10-CM | POA: Diagnosis not present

## 2017-11-24 DIAGNOSIS — M542 Cervicalgia: Secondary | ICD-10-CM | POA: Diagnosis not present

## 2017-12-18 ENCOUNTER — Other Ambulatory Visit: Payer: Self-pay | Admitting: Family

## 2017-12-18 DIAGNOSIS — K5900 Constipation, unspecified: Secondary | ICD-10-CM | POA: Diagnosis not present

## 2017-12-18 DIAGNOSIS — Z23 Encounter for immunization: Secondary | ICD-10-CM | POA: Diagnosis not present

## 2017-12-18 DIAGNOSIS — Z13820 Encounter for screening for osteoporosis: Secondary | ICD-10-CM

## 2017-12-18 DIAGNOSIS — Z0001 Encounter for general adult medical examination with abnormal findings: Secondary | ICD-10-CM | POA: Diagnosis not present

## 2017-12-18 DIAGNOSIS — E559 Vitamin D deficiency, unspecified: Secondary | ICD-10-CM | POA: Diagnosis not present

## 2017-12-18 DIAGNOSIS — J449 Chronic obstructive pulmonary disease, unspecified: Secondary | ICD-10-CM | POA: Diagnosis not present

## 2017-12-22 DIAGNOSIS — M25559 Pain in unspecified hip: Secondary | ICD-10-CM | POA: Diagnosis not present

## 2017-12-22 DIAGNOSIS — M545 Low back pain: Secondary | ICD-10-CM | POA: Diagnosis not present

## 2017-12-22 DIAGNOSIS — G894 Chronic pain syndrome: Secondary | ICD-10-CM | POA: Diagnosis not present

## 2017-12-22 DIAGNOSIS — M25569 Pain in unspecified knee: Secondary | ICD-10-CM | POA: Diagnosis not present

## 2017-12-22 DIAGNOSIS — M542 Cervicalgia: Secondary | ICD-10-CM | POA: Diagnosis not present

## 2018-01-21 DIAGNOSIS — M25569 Pain in unspecified knee: Secondary | ICD-10-CM | POA: Diagnosis not present

## 2018-01-21 DIAGNOSIS — G894 Chronic pain syndrome: Secondary | ICD-10-CM | POA: Diagnosis not present

## 2018-01-21 DIAGNOSIS — M542 Cervicalgia: Secondary | ICD-10-CM | POA: Diagnosis not present

## 2018-01-21 DIAGNOSIS — M545 Low back pain: Secondary | ICD-10-CM | POA: Diagnosis not present

## 2018-01-21 DIAGNOSIS — M25559 Pain in unspecified hip: Secondary | ICD-10-CM | POA: Diagnosis not present

## 2018-02-11 ENCOUNTER — Other Ambulatory Visit: Payer: Medicare Other

## 2018-02-12 DIAGNOSIS — J441 Chronic obstructive pulmonary disease with (acute) exacerbation: Secondary | ICD-10-CM | POA: Diagnosis not present

## 2018-02-12 DIAGNOSIS — M25512 Pain in left shoulder: Secondary | ICD-10-CM | POA: Diagnosis not present

## 2018-02-12 DIAGNOSIS — J449 Chronic obstructive pulmonary disease, unspecified: Secondary | ICD-10-CM | POA: Diagnosis not present

## 2018-02-12 DIAGNOSIS — M19011 Primary osteoarthritis, right shoulder: Secondary | ICD-10-CM | POA: Diagnosis not present

## 2018-02-12 DIAGNOSIS — M19012 Primary osteoarthritis, left shoulder: Secondary | ICD-10-CM | POA: Diagnosis not present

## 2018-02-14 ENCOUNTER — Other Ambulatory Visit: Payer: Self-pay | Admitting: Orthopedic Surgery

## 2018-02-14 DIAGNOSIS — M19012 Primary osteoarthritis, left shoulder: Secondary | ICD-10-CM

## 2018-02-14 DIAGNOSIS — M19011 Primary osteoarthritis, right shoulder: Secondary | ICD-10-CM

## 2018-02-18 DIAGNOSIS — M545 Low back pain: Secondary | ICD-10-CM | POA: Diagnosis not present

## 2018-02-18 DIAGNOSIS — G894 Chronic pain syndrome: Secondary | ICD-10-CM | POA: Diagnosis not present

## 2018-02-18 DIAGNOSIS — M25569 Pain in unspecified knee: Secondary | ICD-10-CM | POA: Diagnosis not present

## 2018-02-18 DIAGNOSIS — M542 Cervicalgia: Secondary | ICD-10-CM | POA: Diagnosis not present

## 2018-02-18 DIAGNOSIS — M25559 Pain in unspecified hip: Secondary | ICD-10-CM | POA: Diagnosis not present

## 2018-02-25 DIAGNOSIS — M19011 Primary osteoarthritis, right shoulder: Secondary | ICD-10-CM | POA: Diagnosis not present

## 2018-02-25 DIAGNOSIS — M19012 Primary osteoarthritis, left shoulder: Secondary | ICD-10-CM | POA: Diagnosis not present

## 2018-02-26 ENCOUNTER — Other Ambulatory Visit: Payer: Medicare Other

## 2018-03-02 DIAGNOSIS — M129 Arthropathy, unspecified: Secondary | ICD-10-CM | POA: Diagnosis not present

## 2018-03-02 DIAGNOSIS — E559 Vitamin D deficiency, unspecified: Secondary | ICD-10-CM | POA: Diagnosis not present

## 2018-03-02 DIAGNOSIS — Z79899 Other long term (current) drug therapy: Secondary | ICD-10-CM | POA: Diagnosis not present

## 2018-03-02 DIAGNOSIS — M545 Low back pain: Secondary | ICD-10-CM | POA: Diagnosis not present

## 2018-03-02 DIAGNOSIS — Z87891 Personal history of nicotine dependence: Secondary | ICD-10-CM | POA: Diagnosis not present

## 2018-03-02 DIAGNOSIS — M25562 Pain in left knee: Secondary | ICD-10-CM | POA: Diagnosis not present

## 2018-03-02 DIAGNOSIS — M25561 Pain in right knee: Secondary | ICD-10-CM | POA: Diagnosis not present

## 2018-03-11 ENCOUNTER — Other Ambulatory Visit: Payer: Medicare Other

## 2018-03-14 ENCOUNTER — Ambulatory Visit
Admission: RE | Admit: 2018-03-14 | Discharge: 2018-03-14 | Disposition: A | Discharge status: Home / Self Care | Payer: Medicare Other | Source: Ambulatory Visit | Attending: Orthopedic Surgery | Admitting: Orthopedic Surgery

## 2018-03-14 DIAGNOSIS — M19012 Primary osteoarthritis, left shoulder: Secondary | ICD-10-CM

## 2018-03-14 DIAGNOSIS — M19011 Primary osteoarthritis, right shoulder: Secondary | ICD-10-CM | POA: Diagnosis not present

## 2018-03-16 DIAGNOSIS — Z79899 Other long term (current) drug therapy: Secondary | ICD-10-CM | POA: Diagnosis not present

## 2018-03-16 DIAGNOSIS — Z87891 Personal history of nicotine dependence: Secondary | ICD-10-CM | POA: Diagnosis not present

## 2018-03-16 DIAGNOSIS — J441 Chronic obstructive pulmonary disease with (acute) exacerbation: Secondary | ICD-10-CM | POA: Diagnosis not present

## 2018-03-16 DIAGNOSIS — J449 Chronic obstructive pulmonary disease, unspecified: Secondary | ICD-10-CM | POA: Diagnosis not present

## 2018-03-16 DIAGNOSIS — M545 Low back pain: Secondary | ICD-10-CM | POA: Diagnosis not present

## 2018-03-23 ENCOUNTER — Emergency Department (HOSPITAL_COMMUNITY)
Admission: EM | Admit: 2018-03-23 | Discharge: 2018-03-23 | Disposition: A | Discharge status: Home / Self Care | Payer: Medicare Other | Attending: Emergency Medicine | Admitting: Emergency Medicine

## 2018-03-23 ENCOUNTER — Encounter (HOSPITAL_COMMUNITY): Payer: Self-pay | Admitting: Emergency Medicine

## 2018-03-23 ENCOUNTER — Other Ambulatory Visit: Payer: Self-pay

## 2018-03-23 DIAGNOSIS — L0291 Cutaneous abscess, unspecified: Secondary | ICD-10-CM

## 2018-03-23 DIAGNOSIS — N764 Abscess of vulva: Secondary | ICD-10-CM | POA: Diagnosis not present

## 2018-03-23 DIAGNOSIS — L0231 Cutaneous abscess of buttock: Secondary | ICD-10-CM | POA: Diagnosis not present

## 2018-03-23 DIAGNOSIS — J449 Chronic obstructive pulmonary disease, unspecified: Secondary | ICD-10-CM | POA: Diagnosis not present

## 2018-03-23 DIAGNOSIS — F17210 Nicotine dependence, cigarettes, uncomplicated: Secondary | ICD-10-CM | POA: Insufficient documentation

## 2018-03-23 DIAGNOSIS — Z79899 Other long term (current) drug therapy: Secondary | ICD-10-CM | POA: Insufficient documentation

## 2018-03-23 DIAGNOSIS — K610 Anal abscess: Secondary | ICD-10-CM | POA: Diagnosis not present

## 2018-03-23 MED ORDER — DOXYCYCLINE HYCLATE 100 MG PO CAPS: 100.0000 mg | ORAL_CAPSULE | Freq: Two times a day (BID) | ORAL | 0 refills | Status: AC

## 2018-03-23 MED ORDER — DOXYCYCLINE HYCLATE 100 MG PO CAPS: 100.0000 mg | ORAL_CAPSULE | Freq: Two times a day (BID) | ORAL | 0 refills | Status: DC

## 2018-03-23 MED ORDER — LIDOCAINE HCL (PF) 1 % IJ SOLN
10.0000 mL | Freq: Once | INTRAMUSCULAR | Status: AC
  Administered 2018-03-23: 10 mL via INTRADERMAL
  Filled 2018-03-23: qty 30

## 2018-03-23 MED ORDER — DOXYCYCLINE HYCLATE 100 MG PO TABS
100.0000 mg | ORAL_TABLET | Freq: Once | ORAL | Status: AC
  Administered 2018-03-23: 100 mg via ORAL
  Filled 2018-03-23: qty 1

## 2018-03-23 NOTE — Discharge Instructions (Addendum)
You can take Tylenol or Ibuprofen as directed for pain. You can alternate Tylenol and Ibuprofen every 4 hours. If you take Tylenol at 1pm, then you can take Ibuprofen at 5pm. Then you can take Tylenol again at 9pm.   Take antibiotics as directed. Please take all of your antibiotics until finished.  Apply warm compresses to the area or soak the area in warm water to help continue express drainage.  Soak only in warm water.  Do not add any Clorox or vinegar to the water.  Keep the wound clean and dry. Gently wash the wound with soap and water and make sure to pat it dry.   Followup with the Emergency Dept  in 2 days for wound recheck and packing removal.   Tinea applying hot compresses to the vagina to continue helping it drained.  You can follow-up with the women's health clinic if it does not improve or return to the emergency department.  As we discussed, there appears to be a protruding hemorrhoid at the rectum.  Your primary care doctor needs to evaluate this to make sure it is not an abnormal growth.  Return to the Emergency Department if you experienced any worsening/spreading redness or swelling, fever, worsening pain, or any other worsening or concerning symptoms.

## 2018-03-23 NOTE — ED Provider Notes (Signed)
[facility]-EMERGENCY DEPT Provider Note   CSN: [id number] Arrival date & time: 03/23/18  0415    History   Chief Complaint Chief Complaint  Patient presents with   Abscess    HPI Dawn Lane is a 68 y.o. female with PMH/o polysubstance abuse, anxiety, emphysema, hemorrhoids who [facility] for evaluation of abscess to left labia majora, left gluteal region that is been ongoing for the last 3 to 4 days.  Patient reports that she has had pain, swelling, redness to the areas of the last 4 days and states that they have progressively worsened.  She reports pain when she tries to sit down.  Patient reports she has been taking her pain medications that she is regularly prescribed for pain relief.  She states she has not measured any fevers.  Patient states that she shaves her vagina and soaks and Clorox and vinegar.  Patient denies any dysuria, hematuria.     The history is provided by the patient.    Past Medical History:  Diagnosis Date   Anxiety 2011   Arthritis    Asthma    Emphysema    Hemorrhoids    Polysubstance abuse (HCC) 2011   4 day [facility] admission for this. Cocaine, ETOH, MJA,    Tobacco use     Patient Active Problem List   Diagnosis Date Noted   COPD active smoker/ pfts pending 01/04/2016   Colitis 08/04/2013   Abdominal pain 08/04/2013   Nausea, vomiting and diarrhea 08/04/2013   Cigarette smoker 08/04/2013   Dyspnea 08/02/2010    Past Surgical History:  Procedure Laterality Date   COLONOSCOPY N/A 08/07/2013   Procedure: COLONOSCOPY;  Surgeon: Iva Booparl E Gessner, MD;  [facility];  Service: Endoscopy;  Laterality: N/A;   INCISION AND DRAINAGE  12/2009   of thrombosed internal hemorrhoid   TUBAL LIGATION       OB History   No obstetric history on file.      Home Medications    Prior to Admission medications   Medication Sig Start Date End Date Taking? Authorizing Provider  albuterol (PROVENTIL  HFA;VENTOLIN HFA) 108 (90 BASE) MCG/ACT inhaler Inhale 2 puffs into the lungs every 6 (six) hours as needed for wheezing. 09/14/12   Ward, Layla MawKristen N, DO  albuterol (PROVENTIL) (2.5 MG/3ML) 0.083% nebulizer solution Take 3 mLs (2.5 mg total) by nebulization every 4 (four) hours as needed for wheezing or shortness of breath. 04/15/14   Samuel JesterMcManus, Kathleen, DO  Ascorbic Acid (VITAMIN C PO) Take 1 tablet daily by mouth.    [provider]  azithromycin (ZITHROMAX) 250 MG tablet Take 2 on day one then 1 daily x 4 days Patient not taking: Reported on 11/14/2016 01/03/16   Nyoka CowdenWert, Michael B, MD  bisacodyl (DULCOLAX) 5 MG EC tablet Take 1 tablet (5 mg total) by mouth 2 (two) times daily. Patient not taking: Reported on 11/14/2016 08/07/13   Edsel PetrinMikhail, Maryann, DO  budesonide-formoterol [facility](SYMBICORT) 160-4.5 MCG/ACT inhaler Take 2 puffs first thing in am and then another 2 puffs about 12 hours later. Patient not taking: Reported on 11/14/2016 01/03/16   Nyoka CowdenWert, Michael B, MD  doxycycline (VIBRAMYCIN) 100 MG capsule Take 1 capsule (100 mg total) by mouth 2 (two) times daily for 7 days. 03/23/18 03/30/18  Dawn CaulLayden, Malakhi Markwood A, Dawn Lane  gabapentin (NEURONTIN) 600 MG tablet Take 600 mg by mouth 4 (four) times daily.    [provider]  guaiFENesin-codeine 100-10 MG/5ML syrup Take 5 mLs by mouth  every 4 (four) hours as needed for cough. Patient not taking: Reported on 11/14/2016 01/03/16   Nyoka Cowden, MD  meloxicam (MOBIC) 15 MG tablet Take 15 mg by mouth 2 (two) times daily.    [provider]  nicotine (EQ NICOTINE) 21 mg/24hr patch Place 1 patch (21 mg total) onto the skin daily. Patient not taking: Reported on 11/14/2016 01/03/16   Nyoka Cowden, MD  ondansetron (ZOFRAN) 4 MG tablet Take 1 tablet (4 mg total) by mouth every 8 (eight) hours as needed for nausea or vomiting. 07/15/17   Caccavale, Sophia, Dawn Lane  Oxycodone HCl 20 MG TABS Take 20 mg by mouth 4 (four) times daily.    [provider]    oxyCODONE-acetaminophen (PERCOCET/ROXICET) 5-325 MG per tablet Take 1 tablet by mouth 3 (three) times daily as needed for severe pain. Patient not taking: Reported on 11/14/2016 08/06/13   Edsel Petrin, DO  predniSONE (DELTASONE) 10 MG tablet Take  4 each am x 2 days,   2 each am x 2 days,  1 each am x 2 days and stop Patient not taking: Reported on 11/14/2016 01/03/16   Nyoka Cowden, MD  senna (SENOKOT) 8.6 MG tablet Take 1 tablet by mouth daily.    [provider]  Vitamin D, Ergocalciferol, (DRISDOL) 50000 units CAPS capsule TK 1 C PO ONCE WEEKLY 11/04/16   [provider]    Family History Family History  Problem Relation Age of Onset   Emphysema Father    Lung cancer Father        was a smoker   Heart disease Father    Prostate cancer Father    Asthma Sister    Uterine cancer Mother     Social History Social History   Tobacco Use   Smoking status: Current Every Day Smoker    Packs/day: 0.50    Years: 50.00    Pack years: 25.00    Types: Cigarettes   Smokeless tobacco: Never Used  Substance Use Topics   Alcohol use: Yes    Comment: BAC .20   Drug use: Yes    Frequency: 7.0 times per week    Types: Marijuana, Cocaine, "Crack" cocaine, Benzodiazepines    Comment: UDS + Cocaine     Allergies   Lorazepam; Advil [ibuprofen]; and Vicodin [hydrocodone-acetaminophen]   Review of Systems Review of Systems  Constitutional: Negative for fever.  Skin: Positive for color change and wound.  All other systems reviewed and are negative.    Physical Exam Updated Vital Signs BP 115/73 (BP Location: Right Arm)    Pulse 84    Temp 98.5 F (36.9 C) (Oral)    Resp 16    Ht  (1.676 m)    Wt 64 kg    SpO2 95%    BMI 22.76 kg/m   Physical Exam Vitals signs and nursing note reviewed. Exam conducted with a chaperone present.  Constitutional:      Appearance: She is well-developed.  HENT:     Head: Normocephalic and atraumatic.  Eyes:      General: No scleral icterus.       Right eye: No discharge.        Left eye: No discharge.     Conjunctiva/sclera: Conjunctivae normal.  Pulmonary:     Effort: Pulmonary effort is normal.  Genitourinary:    Rectum: Guaiac result negative. External hemorrhoid present. No mass or tenderness.       Comments: The exam was  performed with a chaperone present. External, partially protruding hemorrhoid at approximately the 6 o'clock region. No tenderness noted on DRE. No mass palpated.  Skin:    General: Skin is warm and dry.  Neurological:     Mental Status: She is alert.  Psychiatric:        Speech: Speech normal.        Behavior: Behavior normal.      ED Treatments / Results  Labs (all labs ordered are listed, but only abnormal results are displayed) Labs Reviewed - No data to display  EKG None  Radiology No results found.  Procedures .Marland KitchenIncision and Drainage Date/Time: 03/23/2018 5:25 AM Performed by: Dawn Caul, Dawn Lane Authorized by: Dawn Caul, Dawn Lane   Consent:    Consent obtained:  Verbal   Consent given by:  Patient   Risks discussed:  Bleeding, incomplete drainage, pain and damage to other organs   Alternatives discussed:  No treatment Universal protocol:    Procedure explained and questions answered to patient or proxy's satisfaction: yes     Relevant documents present and verified: yes     Test results available and properly labeled: yes     Imaging studies available: yes     Required blood products, implants, devices, and special equipment available: yes     Site/side marked: yes     Immediately prior to procedure a time out was called: yes     Patient identity confirmed:  Verbally with patient Location:    Type:  Abscess   Location:  Anogenital   Anogenital location: left gluteal. Pre-procedure details:    Skin preparation:  Betadine Anesthesia (see MAR for exact dosages):    Anesthesia method:  Local infiltration   Local anesthetic:   Lidocaine 1% w/o epi Procedure type:    Complexity:  Complex Procedure details:    Incision types:  Single straight   Incision depth:  Subcutaneous   Scalpel blade:  11   Wound management:  Probed and deloculated, irrigated with saline and extensive cleaning   Drainage:  Purulent   Drainage amount:  Moderate   Packing materials:  1/4 in gauze Post-procedure details:    Patient tolerance of procedure:  Tolerated well, no immediate complications   (including critical care time)  Medications Ordered in ED Medications  lidocaine (PF) (XYLOCAINE) 1 % injection 10 mL (10 mLs Intradermal Given by Other 03/23/18 0452)  doxycycline (VIBRA-TABS) tablet 100 mg (100 mg Oral Given 03/23/18 0523)     Initial Impression / Assessment and Plan / ED Course  I have reviewed the triage vital signs and the nursing notes.  Pertinent labs & imaging results that were available during my care of the patient were reviewed by me and considered in my medical decision making (see chart for details).        68 year-old female who presents for evaluation of abscesses for last 3 to 4 days.  She has noticed pain, irritation, swelling to vagina, left gluteal region as well as the rectum.  No fevers noted. Patient is afebrile, non-toxic appearing, sitting comfortably on examination table. Vital signs reviewed and stable.  On exam, she has redness, erythema, induration in the left gluteal region.  On rectal exam, she has a external, protruding hemorrhoid.  DRE shows no evidence of tenderness or mass.  Do not suspect perirectal or perianal abscess.  Additionally, she has an abscess noted on the left labia majora.  There is no open aspect on the bottom part of  the labia majora that is slowly oozing purulent drainage.  Discussed with patient that rectal exam shows concern for protruding hemorrhoid versus anal growth.  Instructed patient to follow-up with her primary care doctor.  Given concerns for abscess of the gluteal  region, will plan I&D.  I&D as documented above for gluteal abscess.  Patient difficulty tolerating procedure secondary to pain.  Discussed with patient regarding attempting to drain the vaginal abscess.  I offered incision and drainage here in ED but patient declined.  Patient would like to try antibiotics given that it is already opened and see if that helps improve it.  Additionally, I discussed with patient and just making the opening slightly bigger to allow for more drainage.  Patient declined and stated that she wanted to try antibiotics first and did not want the area cut into.   Patient with allergies to Ativan, Advil and Vicodin.  And for antibiotics.  Instructed patient to return for 2 days for packing removal as well as wound check.  Patient given referral to women's health to follow-up with if vaginal abscess does not improve. At this time, patient exhibits no emergent life-threatening condition that require further evaluation in ED or admission. Patient had ample opportunity for questions and discussion. All patient's questions were answered with full understanding. Strict return precautions discussed. Patient expresses understanding and agreement to plan.   Portions of this note were generated with Scientist, clinical (histocompatibility and immunogenetics). Dictation errors may occur despite best attempts at proofreading.    Final Clinical Impressions(s) / ED Diagnoses   Final diagnoses:  Abscess    ED Discharge Orders         Ordered    doxycycline (VIBRAMYCIN) 100 MG capsule  2 times daily,   Status:  Discontinued     03/23/18 0516    doxycycline (VIBRAMYCIN) 100 MG capsule  2 times daily     03/23/18 0526           Dawn Caul, Dawn Lane 03/23/18 1884    Nira Conn, MD 03/23/18 [phone]

## 2018-03-23 NOTE — ED Triage Notes (Signed)
Patient is complaining of an abscess. Patient states she has it on her vagina, rectum, and on her hip.

## 2018-03-30 DIAGNOSIS — Z79899 Other long term (current) drug therapy: Secondary | ICD-10-CM | POA: Diagnosis not present

## 2018-03-30 DIAGNOSIS — M545 Low back pain: Secondary | ICD-10-CM | POA: Diagnosis not present

## 2018-03-30 DIAGNOSIS — N750 Cyst of Bartholin's gland: Secondary | ICD-10-CM | POA: Diagnosis not present

## 2018-04-03 DIAGNOSIS — M19019 Primary osteoarthritis, unspecified shoulder: Secondary | ICD-10-CM | POA: Diagnosis not present

## 2018-04-07 DIAGNOSIS — Z79899 Other long term (current) drug therapy: Secondary | ICD-10-CM | POA: Diagnosis not present

## 2018-04-07 DIAGNOSIS — E559 Vitamin D deficiency, unspecified: Secondary | ICD-10-CM | POA: Diagnosis not present

## 2018-04-07 DIAGNOSIS — Z0000 Encounter for general adult medical examination without abnormal findings: Secondary | ICD-10-CM | POA: Diagnosis not present

## 2018-04-07 DIAGNOSIS — Z131 Encounter for screening for diabetes mellitus: Secondary | ICD-10-CM | POA: Diagnosis not present

## 2018-04-07 DIAGNOSIS — R5383 Other fatigue: Secondary | ICD-10-CM | POA: Diagnosis not present

## 2018-04-07 DIAGNOSIS — E7800 Pure hypercholesterolemia, unspecified: Secondary | ICD-10-CM | POA: Diagnosis not present

## 2018-04-07 DIAGNOSIS — M129 Arthropathy, unspecified: Secondary | ICD-10-CM | POA: Diagnosis not present

## 2018-04-07 DIAGNOSIS — D539 Nutritional anemia, unspecified: Secondary | ICD-10-CM | POA: Diagnosis not present

## 2018-04-07 DIAGNOSIS — Z87891 Personal history of nicotine dependence: Secondary | ICD-10-CM | POA: Diagnosis not present

## 2018-04-07 DIAGNOSIS — R0602 Shortness of breath: Secondary | ICD-10-CM | POA: Diagnosis not present

## 2018-04-15 DIAGNOSIS — M545 Low back pain: Secondary | ICD-10-CM | POA: Diagnosis not present

## 2018-04-15 DIAGNOSIS — Z79899 Other long term (current) drug therapy: Secondary | ICD-10-CM | POA: Diagnosis not present

## 2018-04-20 DIAGNOSIS — Z780 Asymptomatic menopausal state: Secondary | ICD-10-CM | POA: Diagnosis not present

## 2018-04-24 DIAGNOSIS — M7542 Impingement syndrome of left shoulder: Secondary | ICD-10-CM | POA: Diagnosis not present

## 2018-05-01 DIAGNOSIS — J449 Chronic obstructive pulmonary disease, unspecified: Secondary | ICD-10-CM | POA: Diagnosis not present

## 2018-05-01 DIAGNOSIS — J441 Chronic obstructive pulmonary disease with (acute) exacerbation: Secondary | ICD-10-CM | POA: Diagnosis not present

## 2018-05-07 DIAGNOSIS — R7303 Prediabetes: Secondary | ICD-10-CM | POA: Diagnosis not present

## 2018-05-07 DIAGNOSIS — E0590 Thyrotoxicosis, unspecified without thyrotoxic crisis or storm: Secondary | ICD-10-CM | POA: Diagnosis not present

## 2018-05-07 DIAGNOSIS — Z87891 Personal history of nicotine dependence: Secondary | ICD-10-CM | POA: Diagnosis not present

## 2018-05-07 DIAGNOSIS — E559 Vitamin D deficiency, unspecified: Secondary | ICD-10-CM | POA: Diagnosis not present

## 2018-05-15 DIAGNOSIS — Z79899 Other long term (current) drug therapy: Secondary | ICD-10-CM | POA: Diagnosis not present

## 2018-05-15 DIAGNOSIS — M545 Low back pain: Secondary | ICD-10-CM | POA: Diagnosis not present

## 2018-05-15 DIAGNOSIS — Z9189 Other specified personal risk factors, not elsewhere classified: Secondary | ICD-10-CM | POA: Diagnosis not present

## 2018-05-20 ENCOUNTER — Other Ambulatory Visit: Payer: Self-pay | Admitting: Nurse Practitioner

## 2018-05-20 DIAGNOSIS — T402X5A Adverse effect of other opioids, initial encounter: Secondary | ICD-10-CM | POA: Diagnosis not present

## 2018-05-20 DIAGNOSIS — K645 Perianal venous thrombosis: Secondary | ICD-10-CM | POA: Diagnosis not present

## 2018-05-20 DIAGNOSIS — Z1231 Encounter for screening mammogram for malignant neoplasm of breast: Secondary | ICD-10-CM

## 2018-05-20 DIAGNOSIS — K5903 Drug induced constipation: Secondary | ICD-10-CM | POA: Diagnosis not present

## 2018-05-20 DIAGNOSIS — K648 Other hemorrhoids: Secondary | ICD-10-CM | POA: Diagnosis not present

## 2018-05-27 DIAGNOSIS — K648 Other hemorrhoids: Secondary | ICD-10-CM | POA: Diagnosis not present

## 2018-05-27 DIAGNOSIS — T402X5A Adverse effect of other opioids, initial encounter: Secondary | ICD-10-CM | POA: Diagnosis not present

## 2018-05-27 DIAGNOSIS — K5903 Drug induced constipation: Secondary | ICD-10-CM | POA: Diagnosis not present

## 2018-05-27 DIAGNOSIS — Z800 Family history of malignant neoplasm of digestive organs: Secondary | ICD-10-CM | POA: Diagnosis not present

## 2018-06-12 ENCOUNTER — Emergency Department (HOSPITAL_COMMUNITY)
Admission: EM | Admit: 2018-06-12 | Discharge: 2018-06-12 | Disposition: A | Discharge status: Home / Self Care | Payer: Medicare Other | Attending: Emergency Medicine | Admitting: Emergency Medicine

## 2018-06-12 ENCOUNTER — Other Ambulatory Visit: Payer: Self-pay

## 2018-06-12 ENCOUNTER — Emergency Department (HOSPITAL_COMMUNITY): Payer: Medicare Other

## 2018-06-12 ENCOUNTER — Encounter (HOSPITAL_COMMUNITY): Payer: Self-pay

## 2018-06-12 DIAGNOSIS — R1110 Vomiting, unspecified: Secondary | ICD-10-CM | POA: Diagnosis not present

## 2018-06-12 DIAGNOSIS — J45909 Unspecified asthma, uncomplicated: Secondary | ICD-10-CM | POA: Insufficient documentation

## 2018-06-12 DIAGNOSIS — F1210 Cannabis abuse, uncomplicated: Secondary | ICD-10-CM | POA: Insufficient documentation

## 2018-06-12 DIAGNOSIS — K529 Noninfective gastroenteritis and colitis, unspecified: Secondary | ICD-10-CM | POA: Insufficient documentation

## 2018-06-12 DIAGNOSIS — E860 Dehydration: Secondary | ICD-10-CM | POA: Diagnosis not present

## 2018-06-12 DIAGNOSIS — R112 Nausea with vomiting, unspecified: Secondary | ICD-10-CM | POA: Insufficient documentation

## 2018-06-12 DIAGNOSIS — F1410 Cocaine abuse, uncomplicated: Secondary | ICD-10-CM | POA: Insufficient documentation

## 2018-06-12 DIAGNOSIS — R1084 Generalized abdominal pain: Secondary | ICD-10-CM | POA: Diagnosis present

## 2018-06-12 DIAGNOSIS — R1032 Left lower quadrant pain: Secondary | ICD-10-CM | POA: Diagnosis not present

## 2018-06-12 DIAGNOSIS — F1310 Sedative, hypnotic or anxiolytic abuse, uncomplicated: Secondary | ICD-10-CM | POA: Insufficient documentation

## 2018-06-12 DIAGNOSIS — R1031 Right lower quadrant pain: Secondary | ICD-10-CM | POA: Diagnosis not present

## 2018-06-12 DIAGNOSIS — R197 Diarrhea, unspecified: Secondary | ICD-10-CM | POA: Insufficient documentation

## 2018-06-12 DIAGNOSIS — F17210 Nicotine dependence, cigarettes, uncomplicated: Secondary | ICD-10-CM | POA: Diagnosis not present

## 2018-06-12 LAB — COMPREHENSIVE METABOLIC PANEL
ALT: 20 U/L (ref 0–44)
AST: 22 U/L (ref 15–41)
Albumin: 5.3 g/dL — ABNORMAL HIGH (ref 3.5–5.0)
Alkaline Phosphatase: 55 U/L (ref 38–126)
Anion gap: 9 (ref 5–15)
BUN: 12 mg/dL (ref 8–23)
CO2: 29 mmol/L (ref 22–32)
Calcium: 10.3 mg/dL (ref 8.9–10.3)
Chloride: 104 mmol/L (ref 98–111)
Creatinine, Ser: 0.88 mg/dL (ref 0.44–1.00)
GFR calc Af Amer: >60 mL/min (ref >60)
GFR calc non Af Amer: >60 mL/min (ref >60)
Glucose, Bld: 121 mg/dL — ABNORMAL HIGH (ref 70–99)
Potassium: 4.1 mmol/L (ref 3.5–5.1)
Sodium: 142 mmol/L (ref 135–145)
Total Bilirubin: 0.7 mg/dL (ref 0.3–1.2)
Total Protein: 8.8 g/dL — ABNORMAL HIGH (ref 6.5–8.1)

## 2018-06-12 LAB — CBC WITH DIFFERENTIAL/PLATELET
Abs Immature Granulocytes: 0.04 10*3/uL (ref 0.00–0.07)
Basophils Absolute: 0 10*3/uL (ref 0.0–0.1)
Basophils Relative: 0 %
Eosinophils Absolute: 0 10*3/uL (ref 0.0–0.5)
Eosinophils Relative: 0 %
HCT: 53.9 % — ABNORMAL HIGH (ref 36.0–46.0)
Hemoglobin: 17.3 g/dL — ABNORMAL HIGH (ref 12.0–15.0)
Immature Granulocytes: 1 %
Lymphocytes Relative: 8 %
Lymphs Abs: 0.7 10*3/uL (ref 0.7–4.0)
MCH: 28.5 pg (ref 26.0–34.0)
MCHC: 32.1 g/dL (ref 30.0–36.0)
MCV: 88.7 fL (ref 80.0–100.0)
Monocytes Absolute: 0.3 10*3/uL (ref 0.1–1.0)
Monocytes Relative: 4 %
Neutro Abs: 7.6 10*3/uL (ref 1.7–7.7)
Neutrophils Relative %: 87 %
Platelets: 314 10*3/uL (ref 150–400)
RBC: 6.08 MIL/uL — ABNORMAL HIGH (ref 3.87–5.11)
RDW: 13.2 % (ref 11.5–15.5)
WBC: 8.7 10*3/uL (ref 4.0–10.5)
nRBC: 0 % (ref 0.0–0.2)

## 2018-06-12 LAB — WET PREP, GENITAL
Clue Cells Wet Prep HPF POC: NONE SEEN
Sperm: NONE SEEN
Trich, Wet Prep: NONE SEEN
Yeast Wet Prep HPF POC: NONE SEEN

## 2018-06-12 LAB — URINALYSIS, ROUTINE W REFLEX MICROSCOPIC
Bilirubin Urine: NEGATIVE
Glucose, UA: NEGATIVE mg/dL
Hgb urine dipstick: NEGATIVE
Ketones, ur: NEGATIVE mg/dL
Leukocytes,Ua: NEGATIVE
Nitrite: NEGATIVE
Protein, ur: NEGATIVE mg/dL
Specific Gravity, Urine: 1.016 (ref 1.005–1.030)
pH: 5 (ref 5.0–8.0)

## 2018-06-12 LAB — LIPASE, BLOOD: Lipase: 24 U/L (ref 11–51)

## 2018-06-12 MED ORDER — ONDANSETRON HCL 4 MG/2ML IJ SOLN
4.0000 mg | Freq: Once | INTRAMUSCULAR | Status: AC
  Administered 2018-06-12: 4 mg via INTRAVENOUS
  Filled 2018-06-12: qty 2

## 2018-06-12 MED ORDER — MORPHINE SULFATE (PF) 4 MG/ML IV SOLN
4.0000 mg | Freq: Once | INTRAVENOUS | Status: AC
  Administered 2018-06-12: 18:00:00 4 mg via INTRAVENOUS
  Filled 2018-06-12: qty 1

## 2018-06-12 MED ORDER — SODIUM CHLORIDE 0.9 % IV BOLUS
1000.0000 mL | Freq: Once | INTRAVENOUS | Status: AC
  Administered 2018-06-12: 1000 mL via INTRAVENOUS

## 2018-06-12 MED ORDER — METRONIDAZOLE 500 MG PO TABS: 500.0000 mg | ORAL_TABLET | Freq: Three times a day (TID) | ORAL | 0 refills | Status: AC

## 2018-06-12 MED ORDER — IOHEXOL 300 MG/ML  SOLN
100.0000 mL | Freq: Once | INTRAMUSCULAR | Status: AC | PRN
  Administered 2018-06-12: 100 mL via INTRAVENOUS

## 2018-06-12 MED ORDER — CIPROFLOXACIN HCL 500 MG PO TABS: 500.0000 mg | ORAL_TABLET | Freq: Two times a day (BID) | ORAL | 0 refills | Status: DC

## 2018-06-12 MED ORDER — MORPHINE SULFATE (PF) 4 MG/ML IV SOLN
4.0000 mg | Freq: Once | INTRAVENOUS | Status: AC
  Administered 2018-06-12: 4 mg via INTRAVENOUS
  Filled 2018-06-12: qty 1

## 2018-06-12 MED ORDER — PROMETHAZINE HCL 25 MG/ML IJ SOLN
6.2500 mg | Freq: Once | INTRAMUSCULAR | Status: AC
  Administered 2018-06-12: 18:00:00 6.25 mg via INTRAVENOUS
  Filled 2018-06-12: qty 1

## 2018-06-12 NOTE — ED Triage Notes (Signed)
Pt states she has been having generalized abdominal pain since 0700. Pt states that she emesis and diarrhea. Pt states approximately 11-12 episodes of emesis today.

## 2018-06-12 NOTE — ED Provider Notes (Signed)
Patient presents today for evaluation of abdominal pain since 7am with emesis and diarrhea.   Assumed care of patient from previous team, please see their note for full H&P.   Physical Exam  BP (!) 161/89 (BP Location: Right Arm)   Pulse (!) 55   Temp 98.2 F (36.8 C) (Oral)   Resp 18   Ht 5\' 6"  (1.676 m)   Wt 64.4 kg   SpO2 96%   BMI 22.92 kg/m   Physical Exam Vitals signs and nursing note reviewed.  Constitutional:      General: She is not in acute distress. Cardiovascular:     Rate and Rhythm: Normal rate.  Pulmonary:     Effort: Pulmonary effort is normal. No respiratory distress.  Skin:    General: Skin is warm.  Neurological:     General: No focal deficit present.     Mental Status: She is alert.     ED Course/Procedures     Procedures  Labs Reviewed  WET PREP, GENITAL - Abnormal; Notable for the following components:      Result Value   WBC, Wet Prep HPF POC FEW (*)    All other components within normal limits  COMPREHENSIVE METABOLIC PANEL - Abnormal; Notable for the following components:   Glucose, Bld 121 (*)    Total Protein 8.8 (*)    Albumin 5.3 (*)    All other components within normal limits  CBC WITH DIFFERENTIAL/PLATELET - Abnormal; Notable for the following components:   RBC 6.08 (*)    Hemoglobin 17.3 (*)    HCT 53.9 (*)    All other components within normal limits  LIPASE, BLOOD  URINALYSIS, ROUTINE W REFLEX MICROSCOPIC  RPR  HIV ANTIBODY (ROUTINE TESTING W REFLEX)  GC/CHLAMYDIA PROBE AMP ([facility]) NOT AT [facility]    Ct Abdomen Pelvis W Contrast  Result Date: 06/12/2018 CLINICAL DATA:  Sudden onset lower abdominal pain, nausea, vomiting, diarrhea EXAM: CT ABDOMEN AND PELVIS WITH CONTRAST TECHNIQUE: Multidetector CT imaging of the abdomen and pelvis was performed using the standard protocol following bolus administration of intravenous contrast. CONTRAST:  100mL OMNIPAQUE IOHEXOL 300 MG/ML  SOLN COMPARISON:  06/21/2017 FINDINGS: Lower  chest: No acute abnormality.  Cardiomegaly. Hepatobiliary: No solid liver abnormality is seen. No gallstones, gallbladder wall thickening, or biliary dilatation. Pancreas: Unremarkable. No pancreatic ductal dilatation or surrounding inflammatory changes. Spleen: Normal in size without significant abnormality. Adrenals/Urinary Tract: Adrenal glands are unremarkable. Kidneys are normal, without renal calculi, solid lesion, or hydronephrosis. Bladder is unremarkable. Stomach/Bowel: Stomach is within normal limits. Diminutive, although normal appendix. There may be mild inflammatory thickening and stranding about the decompressed ascending and transverse colon. Vascular/Lymphatic: No significant vascular findings are present. No enlarged abdominal or pelvic lymph nodes. Reproductive: Calcified uterine fibroids. Other: No abdominal wall hernia or abnormality. No abdominopelvic ascites. Musculoskeletal: No acute or significant osseous findings. IMPRESSION: 1. There may be mild inflammatory thickening and stranding about the decompressed ascending and transverse colon, suggestive of nonspecific infectious, inflammatory, or ischemic colitis. 2.  Chronic and incidental findings as detailed above. Electronically Signed   By: Lauralyn PrimesAlex  Bibbey M.D.   On: 06/12/2018 18:27    MDM  Plan to f/u on wet prep and ct scan.    1921: Reevaluated patient, she is comfortable and drowsy however awakens and reports feeling much better.  She asked for something to eat and drink.  Will p.o. challenge patient.  Patient was given p.o. challenge.  Her wet prep showed white cells  without other abnormalities noted.  Her CT scan showed inflammatory thickening and stranding along the ascending and transverse colon suspect of colitis.  This patient was seen as a shared visit with Dr. Freida Busman.  She is given prescriptions for Cipro and Flagyl after chart review shows on last EKG her QTC was not significantly prolonged.  Return precautions were  discussed with patient who states their understanding.  At the time of discharge patient denied any unaddressed complaints or concerns.  Patient is agreeable for discharge home.      Cristina Gong, PA-C 06/13/18 0006    Lorre Nick, MD 06/13/18 [phone]

## 2018-06-12 NOTE — ED Notes (Signed)
Pt does not want to come for CT scan until she receives pain and nausea meds.

## 2018-06-12 NOTE — ED Provider Notes (Signed)
[facility]-EMERGENCY DEPT Provider Note   CSN: [id number] Arrival date & time: 06/12/18  1418    History   Chief Complaint Chief Complaint  Patient presents with   Abdominal Pain    HPI Dawn Lane is a 68 y.o. female who presents to the ED complaining of sudden onset, constant, 10/10, generalized abdominal pain worse in the lower quadrants that began at 7 AM today. Pt also endorses nausea, nonbloody  nonbilious emesis, and diarrhea. Denies hematemesis, coffee ground emesis, hematochezia, or melena. No suspicious food intake. No recent abx use. Occasionally drinks alcohol. Pt has not taken anything for the pain. No fever, chills, urinary sx, vaginal sx. Pt is sexually active; last had intercourse 1 month ago but did not use protection. PSHx includes tubal ligation and appendectomy.        Past Medical History:  Diagnosis Date   Anxiety 2011   Arthritis    Asthma    Emphysema    Hemorrhoids    Polysubstance abuse (HCC) 2011   4 day [facility] admission for this. Cocaine, ETOH, MJA,    Tobacco use     Patient Active Problem List   Diagnosis Date Noted   COPD active smoker/ pfts pending 01/04/2016   Colitis 08/04/2013   Abdominal pain 08/04/2013   Nausea, vomiting and diarrhea 08/04/2013   Cigarette smoker 08/04/2013   Dyspnea 08/02/2010    Past Surgical History:  Procedure Laterality Date   COLONOSCOPY N/A 08/07/2013   Procedure: COLONOSCOPY;  Surgeon: Iva Boop, MD;  Location: [facility] ENDOSCOPY;  Service: Endoscopy;  Laterality: N/A;   INCISION AND DRAINAGE  12/2009   of thrombosed internal hemorrhoid   TUBAL LIGATION       OB History   No obstetric history on file.      Home Medications    Prior to Admission medications   Medication Sig Start Date End Date Taking? Authorizing Provider  albuterol (PROVENTIL HFA;VENTOLIN HFA) 108 (90 BASE) MCG/ACT inhaler Inhale 2 puffs into the lungs every 6 (six) hours as needed for  wheezing. 09/14/12   Ward, Layla Maw, DO  albuterol (PROVENTIL) (2.5 MG/3ML) 0.083% nebulizer solution Take 3 mLs (2.5 mg total) by nebulization every 4 (four) hours as needed for wheezing or shortness of breath. 04/15/14   Samuel Jester, DO  Ascorbic Acid (VITAMIN C PO) Take 1 tablet daily by mouth.    [provider]  azithromycin (ZITHROMAX) 250 MG tablet Take 2 on day one then 1 daily x 4 days Patient not taking: Reported on 11/14/2016 01/03/16   Nyoka Cowden, MD  bisacodyl (DULCOLAX) 5 MG EC tablet Take 1 tablet (5 mg total) by mouth 2 (two) times daily. Patient not taking: Reported on 11/14/2016 08/07/13   Edsel Petrin, DO  budesonide-formoterol [facility]) 160-4.5 MCG/ACT inhaler Take 2 puffs first thing in am and then another 2 puffs about 12 hours later. Patient not taking: Reported on 11/14/2016 01/03/16   Nyoka Cowden, MD  gabapentin (NEURONTIN) 600 MG tablet Take 600 mg by mouth 4 (four) times daily.    [provider]  guaiFENesin-codeine 100-10 MG/5ML syrup Take 5 mLs by mouth every 4 (four) hours as needed for cough. Patient not taking: Reported on 11/14/2016 01/03/16   Nyoka Cowden, MD  meloxicam (MOBIC) 15 MG tablet Take 15 mg by mouth 2 (two) times daily.    [provider]  nicotine (EQ NICOTINE) 21 mg/24hr patch Place 1 patch (21 mg total) onto the  skin daily. Patient not taking: Reported on 11/14/2016 01/03/16   Nyoka Cowden, MD  ondansetron (ZOFRAN) 4 MG tablet Take 1 tablet (4 mg total) by mouth every 8 (eight) hours as needed for nausea or vomiting. 07/15/17   Caccavale, Sophia, PA-C  Oxycodone HCl 20 MG TABS Take 20 mg by mouth 4 (four) times daily.    [provider]  oxyCODONE-acetaminophen (PERCOCET/ROXICET) 5-325 MG per tablet Take 1 tablet by mouth 3 (three) times daily as needed for severe pain. Patient not taking: Reported on 11/14/2016 08/06/13   Edsel Petrin, DO  predniSONE (DELTASONE) 10 MG tablet Take  4 each am x 2  days,   2 each am x 2 days,  1 each am x 2 days and stop Patient not taking: Reported on 11/14/2016 01/03/16   Nyoka Cowden, MD  senna (SENOKOT) 8.6 MG tablet Take 1 tablet by mouth daily.    [provider]  Vitamin D, Ergocalciferol, (DRISDOL) 50000 units CAPS capsule TK 1 C PO ONCE WEEKLY 11/04/16   [provider]    Family History Family History  Problem Relation Age of Onset   Emphysema Father    Lung cancer Father        was a smoker   Heart disease Father    Prostate cancer Father    Asthma Sister    Uterine cancer Mother     Social History Social History   Tobacco Use   Smoking status: Current Every Day Smoker    Packs/day: 0.50    Years: 50.00    Pack years: 25.00    Types: Cigarettes   Smokeless tobacco: Never Used  Substance Use Topics   Alcohol use: Yes    Comment: BAC .20   Drug use: Yes    Frequency: 7.0 times per week    Types: Marijuana, Cocaine, "Crack" cocaine, Benzodiazepines    Comment: UDS + Cocaine- denies 06/12/18     Allergies   Lorazepam; Advil [ibuprofen]; and Vicodin [hydrocodone-acetaminophen]   Review of Systems Review of Systems  Constitutional: Negative for chills and fever.  HENT: Negative for congestion.   Eyes: Negative for redness.  Respiratory: Negative for cough and shortness of breath.   Cardiovascular: Negative for chest pain.  Gastrointestinal: Positive for abdominal pain, diarrhea, nausea and vomiting. Negative for blood in stool and constipation.  Genitourinary: Negative for dysuria, flank pain, frequency, vaginal bleeding, vaginal discharge and vaginal pain.  Musculoskeletal: Negative for myalgias.  Skin: Negative for rash.  Neurological: Negative for headaches.     Physical Exam Updated Vital Signs BP (!) 154/111 (BP Location: Right Arm)    Pulse 67    Temp 98.2 F (36.8 C) (Oral)    Resp 16    Ht 5\' 6"  (1.676 m)    Wt 64.4 kg    SpO2 96%    BMI 22.92 kg/m   Physical Exam Vitals  signs and nursing note reviewed.  Constitutional:      Comments: Uncomfortable appearing female; actively vomiting in room  HENT:     Head: Normocephalic and atraumatic.  Eyes:     Conjunctiva/sclera: Conjunctivae normal.  Neck:     Musculoskeletal: Neck supple.  Cardiovascular:     Rate and Rhythm: Normal rate and regular rhythm.     Heart sounds: Normal heart sounds.  Pulmonary:     Effort: Pulmonary effort is normal.     Breath sounds: Normal breath sounds. No wheezing, rhonchi or rales.  Abdominal:  General: Abdomen is flat. Bowel sounds are normal.     Palpations: Abdomen is soft.     Tenderness: There is abdominal tenderness in the right lower quadrant, suprapubic area and left lower quadrant. Negative signs include McBurney's sign.     Comments: No obvious distension noted to abdomen; TTP along bilateral lower quadrants as well as suprapubic area; no rebound or guarding; no CVA tenderness bilaterally  Genitourinary:    Comments: Chaperone present for exam. No rashes, lesions, or tenderness to external genitalia. No erythema, injury, or tenderness to vaginal mucosa. No  vaginal discharge or bleeding within vaginal vault. No  adnexal masses, tenderness, or fullness. No CMT, cervical friability, or discharge from cervical os. Cervical os is closed.   Skin:    General: Skin is warm and dry.  Neurological:     Mental Status: She is alert.      ED Treatments / Results  Labs (all labs ordered are listed, but only abnormal results are displayed) Labs Reviewed - No data to display  EKG None  Radiology No results found.  Procedures Procedures (including critical care time)  Medications Ordered in ED Medications  sodium chloride 0.9 % bolus 1,000 mL (has no administration in time range)  morphine 4 MG/ML injection 4 mg (has no administration in time range)  ondansetron (ZOFRAN) injection 4 mg (has no administration in time range)     Initial Impression /  Assessment and Plan / ED Course  I have reviewed the triage vital signs and the nursing notes.  Pertinent labs & imaging results that were available during my care of the patient were reviewed by me and considered in my medical decision making (see chart for details).    Pt is a 68 year old female who presents with lower abdominal pain, nausea, vomiting, and diarrhea. Actively vomiting in room. Has not tried anything for symptoms. No recent abx to suggest C diff. No suspicious food intake. No excessive EtOH usage. Will get baseline bloodwork today including CBC, CMP, lipase, U/A, as well as CT A/P given patient having pain to bilateral quadrants. Will also perform pelvic exam as patient sexually active/not using protection. Do not believe patient needs pelvic ultrasound at this time; no adnexal tenderness on exam. Pt afebrile as well and having diarrhea; do not suspect gyn cause for symptoms.   CBC without leukocytosis to suggest infectious process. Electrolytes within normal limits. No elevate in LFTs or creatinine. Lipase negative as well as U/A. Awaiting wet prep results as well as CT A/P. Likely patient is suffering from gastroenteritis but would like to rule out other causes on CT scan including diverticulitis.   Upon reevaluation patient still complaining of pain and nausea; reports she has vomited twice since zofran administration. Will give more pain medication as well as low dose of phenergan.   5:27 PM At shift change case signed out to Lyndel SafeElizabeth Hammond, BasinPA-C, who will dispo patient accordingly when remainder of labs and imaging return. Plan to discharge home with antiemetics if symptoms can be controlled in the ED.         Final Clinical Impressions(s) / ED Diagnoses   Final diagnoses:  None    ED Discharge Orders    None       Tanda RockersVenter, Jovin Fester, PA-C 06/12/18 1727    Lorre NickAllen, Anthony, MD 06/13/18 1752

## 2018-06-12 NOTE — Discharge Instructions (Addendum)
You may have diarrhea from the antibiotics.  It is very important that you continue to take the antibiotics even if you get diarrhea unless a medical professional tells you that you may stop taking them.  If you stop too early the bacteria you are being treated for will become stronger and you may need different, more powerful antibiotics that have more side effects and worsening diarrhea.  Please stay well hydrated and consider probiotics as they may decrease the severity of your diarrhea.   You have been given a prescription today for Flagyl.  It is very important that while you are taking Flagyl you do not drink any alcohol.  If you have consumed alcohol in the past 24 hours please wait at least 24 hours prior to taking Flagyl.  You also should not drink alcohol for a minimum of 3 days after you complete your Flagyl.

## 2018-06-12 NOTE — ED Provider Notes (Signed)
Medical screening examination/treatment/procedure(s) were conducted as a shared visit with non-physician practitioner(s) and myself.  I personally evaluated the patient during the encounter.  None 68 year old female presents with abdominal discomfort.  Abdominal CT consistent with colitis.  Will place on antibiotics and return precautions to be given   Lorre Nick, MD 06/12/18 1956

## 2018-06-12 NOTE — ED Notes (Signed)
Food and beverage offered to the patient.

## 2018-06-13 LAB — RPR: RPR Ser Ql: NONREACTIVE

## 2018-06-13 LAB — HIV ANTIBODY (ROUTINE TESTING W REFLEX): HIV Screen 4th Generation wRfx: NONREACTIVE

## 2018-06-14 ENCOUNTER — Emergency Department (HOSPITAL_COMMUNITY)
Admission: EM | Admit: 2018-06-14 | Discharge: 2018-06-15 | Disposition: A | Discharge status: Home / Self Care | Payer: Medicare Other | Attending: Emergency Medicine | Admitting: Emergency Medicine

## 2018-06-14 ENCOUNTER — Encounter (HOSPITAL_COMMUNITY): Payer: Self-pay | Admitting: Emergency Medicine

## 2018-06-14 ENCOUNTER — Other Ambulatory Visit: Payer: Self-pay

## 2018-06-14 DIAGNOSIS — Z5321 Procedure and treatment not carried out due to patient leaving prior to being seen by health care provider: Secondary | ICD-10-CM | POA: Diagnosis not present

## 2018-06-14 DIAGNOSIS — R109 Unspecified abdominal pain: Secondary | ICD-10-CM | POA: Diagnosis not present

## 2018-06-14 MED ORDER — SODIUM CHLORIDE 0.9% FLUSH: 3.0000 mL | Freq: Once | INTRAVENOUS | Status: DC

## 2018-06-14 NOTE — ED Triage Notes (Signed)
Pt reports having increasing swelling in abdomen after diagnosed with colitis on 06/12/2018. Pt states that the pain is unbearable.

## 2018-06-14 NOTE — ED Notes (Signed)
Pt left Triage 2, where she was placed to await a room and to be seen by a provider, without notice to [facility] or RN in triage.  Writer looked in hall and restrooms nearby thinking pt would be there she was not.  Pt is not in the lobby either, went to lobby and called pt by name for blood draw.  RN aware.

## 2018-06-14 NOTE — ED Notes (Signed)
Pt called with no response.  RN notified.

## 2018-06-14 NOTE — ED Notes (Signed)
Pt sts " I have a colonoscopy on the 22nd but I'm old and its getting more infected, I have a bulging here (points to right abd/flank) are that is hard.  I cant take the pain."  RN notified

## 2018-06-15 DIAGNOSIS — K529 Noninfective gastroenteritis and colitis, unspecified: Secondary | ICD-10-CM | POA: Diagnosis not present

## 2018-06-15 DIAGNOSIS — Z79899 Other long term (current) drug therapy: Secondary | ICD-10-CM | POA: Diagnosis not present

## 2018-06-15 DIAGNOSIS — M545 Low back pain: Secondary | ICD-10-CM | POA: Diagnosis not present

## 2018-06-15 LAB — GC/CHLAMYDIA PROBE AMP (~~LOC~~) NOT AT ARMC
Chlamydia: NEGATIVE
Neisseria Gonorrhea: NEGATIVE

## 2018-06-17 DIAGNOSIS — K529 Noninfective gastroenteritis and colitis, unspecified: Secondary | ICD-10-CM | POA: Diagnosis not present

## 2018-06-17 DIAGNOSIS — Z79899 Other long term (current) drug therapy: Secondary | ICD-10-CM | POA: Diagnosis not present

## 2018-06-17 DIAGNOSIS — K648 Other hemorrhoids: Secondary | ICD-10-CM | POA: Diagnosis not present

## 2018-06-19 DIAGNOSIS — K529 Noninfective gastroenteritis and colitis, unspecified: Secondary | ICD-10-CM | POA: Diagnosis not present

## 2018-06-19 DIAGNOSIS — L309 Dermatitis, unspecified: Secondary | ICD-10-CM | POA: Diagnosis not present

## 2018-06-24 DIAGNOSIS — Z1159 Encounter for screening for other viral diseases: Secondary | ICD-10-CM | POA: Diagnosis not present

## 2018-06-24 DIAGNOSIS — R1084 Generalized abdominal pain: Secondary | ICD-10-CM | POA: Diagnosis not present

## 2018-06-24 DIAGNOSIS — R110 Nausea: Secondary | ICD-10-CM | POA: Diagnosis not present

## 2018-06-24 DIAGNOSIS — K5792 Diverticulitis of intestine, part unspecified, without perforation or abscess without bleeding: Secondary | ICD-10-CM | POA: Diagnosis not present

## 2018-07-01 DIAGNOSIS — K5792 Diverticulitis of intestine, part unspecified, without perforation or abscess without bleeding: Secondary | ICD-10-CM | POA: Diagnosis not present

## 2018-07-01 DIAGNOSIS — K648 Other hemorrhoids: Secondary | ICD-10-CM | POA: Diagnosis not present

## 2018-07-01 DIAGNOSIS — K529 Noninfective gastroenteritis and colitis, unspecified: Secondary | ICD-10-CM | POA: Diagnosis not present

## 2018-07-01 DIAGNOSIS — Z1211 Encounter for screening for malignant neoplasm of colon: Secondary | ICD-10-CM | POA: Diagnosis not present

## 2018-07-01 DIAGNOSIS — R1084 Generalized abdominal pain: Secondary | ICD-10-CM | POA: Diagnosis not present

## 2018-07-14 DIAGNOSIS — Z79899 Other long term (current) drug therapy: Secondary | ICD-10-CM | POA: Diagnosis not present

## 2018-07-14 DIAGNOSIS — R1084 Generalized abdominal pain: Secondary | ICD-10-CM | POA: Diagnosis not present

## 2018-07-14 DIAGNOSIS — M545 Low back pain: Secondary | ICD-10-CM | POA: Diagnosis not present

## 2018-07-23 DIAGNOSIS — R0602 Shortness of breath: Secondary | ICD-10-CM | POA: Diagnosis not present

## 2018-07-23 DIAGNOSIS — J438 Other emphysema: Secondary | ICD-10-CM | POA: Diagnosis not present

## 2018-07-28 ENCOUNTER — Other Ambulatory Visit: Payer: Self-pay

## 2018-07-29 LAB — ALPHA-1-ANTITRYPSIN: A-1 Antitrypsin: 142 mg/dL (ref 101–187)

## 2018-08-08 DIAGNOSIS — J449 Chronic obstructive pulmonary disease, unspecified: Secondary | ICD-10-CM | POA: Diagnosis not present

## 2018-08-13 DIAGNOSIS — Z79899 Other long term (current) drug therapy: Secondary | ICD-10-CM | POA: Diagnosis not present

## 2018-08-13 DIAGNOSIS — M25511 Pain in right shoulder: Secondary | ICD-10-CM | POA: Diagnosis not present

## 2018-08-13 DIAGNOSIS — M545 Low back pain: Secondary | ICD-10-CM | POA: Diagnosis not present

## 2018-08-13 DIAGNOSIS — M25512 Pain in left shoulder: Secondary | ICD-10-CM | POA: Diagnosis not present

## 2018-08-13 DIAGNOSIS — E559 Vitamin D deficiency, unspecified: Secondary | ICD-10-CM | POA: Diagnosis not present

## 2018-08-20 DIAGNOSIS — M24811 Other specific joint derangements of right shoulder, not elsewhere classified: Secondary | ICD-10-CM | POA: Diagnosis not present

## 2018-08-20 DIAGNOSIS — M24812 Other specific joint derangements of left shoulder, not elsewhere classified: Secondary | ICD-10-CM | POA: Diagnosis not present

## 2018-08-20 DIAGNOSIS — M75111 Incomplete rotator cuff tear or rupture of right shoulder, not specified as traumatic: Secondary | ICD-10-CM | POA: Diagnosis not present

## 2018-08-20 DIAGNOSIS — M67912 Unspecified disorder of synovium and tendon, left shoulder: Secondary | ICD-10-CM | POA: Diagnosis not present

## 2018-09-07 DIAGNOSIS — K635 Polyp of colon: Secondary | ICD-10-CM | POA: Diagnosis not present

## 2018-09-07 DIAGNOSIS — K648 Other hemorrhoids: Secondary | ICD-10-CM | POA: Diagnosis not present

## 2018-09-07 DIAGNOSIS — Z1211 Encounter for screening for malignant neoplasm of colon: Secondary | ICD-10-CM | POA: Diagnosis not present

## 2018-09-07 DIAGNOSIS — Z01818 Encounter for other preprocedural examination: Secondary | ICD-10-CM | POA: Diagnosis not present

## 2018-09-08 DIAGNOSIS — R7303 Prediabetes: Secondary | ICD-10-CM | POA: Diagnosis not present

## 2018-09-08 DIAGNOSIS — E559 Vitamin D deficiency, unspecified: Secondary | ICD-10-CM | POA: Diagnosis not present

## 2018-09-09 DIAGNOSIS — J449 Chronic obstructive pulmonary disease, unspecified: Secondary | ICD-10-CM | POA: Diagnosis not present

## 2018-09-09 DIAGNOSIS — R5383 Other fatigue: Secondary | ICD-10-CM | POA: Diagnosis not present

## 2018-09-09 DIAGNOSIS — E7800 Pure hypercholesterolemia, unspecified: Secondary | ICD-10-CM | POA: Diagnosis not present

## 2018-09-09 DIAGNOSIS — Z1159 Encounter for screening for other viral diseases: Secondary | ICD-10-CM | POA: Diagnosis not present

## 2018-09-09 DIAGNOSIS — R7303 Prediabetes: Secondary | ICD-10-CM | POA: Diagnosis not present

## 2018-09-09 DIAGNOSIS — E559 Vitamin D deficiency, unspecified: Secondary | ICD-10-CM | POA: Diagnosis not present

## 2018-09-10 DIAGNOSIS — J439 Emphysema, unspecified: Secondary | ICD-10-CM | POA: Diagnosis not present

## 2018-09-10 DIAGNOSIS — R0602 Shortness of breath: Secondary | ICD-10-CM | POA: Diagnosis not present

## 2018-09-11 DIAGNOSIS — M62830 Muscle spasm of back: Secondary | ICD-10-CM | POA: Diagnosis not present

## 2018-09-11 DIAGNOSIS — M545 Low back pain: Secondary | ICD-10-CM | POA: Diagnosis not present

## 2018-09-11 DIAGNOSIS — M25512 Pain in left shoulder: Secondary | ICD-10-CM | POA: Diagnosis not present

## 2018-09-11 DIAGNOSIS — Z79899 Other long term (current) drug therapy: Secondary | ICD-10-CM | POA: Diagnosis not present

## 2018-09-11 DIAGNOSIS — M25511 Pain in right shoulder: Secondary | ICD-10-CM | POA: Diagnosis not present

## 2018-10-09 DIAGNOSIS — Z79899 Other long term (current) drug therapy: Secondary | ICD-10-CM | POA: Diagnosis not present

## 2018-10-09 DIAGNOSIS — M25512 Pain in left shoulder: Secondary | ICD-10-CM | POA: Diagnosis not present

## 2018-10-09 DIAGNOSIS — M25511 Pain in right shoulder: Secondary | ICD-10-CM | POA: Diagnosis not present

## 2018-10-09 DIAGNOSIS — M545 Low back pain: Secondary | ICD-10-CM | POA: Diagnosis not present

## 2018-10-09 DIAGNOSIS — M62830 Muscle spasm of back: Secondary | ICD-10-CM | POA: Diagnosis not present

## 2018-10-14 DIAGNOSIS — G8918 Other acute postprocedural pain: Secondary | ICD-10-CM | POA: Diagnosis not present

## 2018-10-14 DIAGNOSIS — M19012 Primary osteoarthritis, left shoulder: Secondary | ICD-10-CM | POA: Diagnosis not present

## 2018-10-14 DIAGNOSIS — S43432A Superior glenoid labrum lesion of left shoulder, initial encounter: Secondary | ICD-10-CM | POA: Diagnosis not present

## 2018-10-14 DIAGNOSIS — S43492A Other sprain of left shoulder joint, initial encounter: Secondary | ICD-10-CM | POA: Diagnosis not present

## 2018-10-14 DIAGNOSIS — S46012A Strain of muscle(s) and tendon(s) of the rotator cuff of left shoulder, initial encounter: Secondary | ICD-10-CM | POA: Diagnosis not present

## 2018-10-14 DIAGNOSIS — M7542 Impingement syndrome of left shoulder: Secondary | ICD-10-CM | POA: Diagnosis not present

## 2018-10-14 HISTORY — PX: OTHER SURGICAL HISTORY: SHX169

## 2018-10-20 DIAGNOSIS — Z9889 Other specified postprocedural states: Secondary | ICD-10-CM | POA: Diagnosis not present

## 2018-10-22 ENCOUNTER — Encounter: Payer: Self-pay | Admitting: Physical Therapy

## 2018-10-22 ENCOUNTER — Ambulatory Visit: Payer: Medicare Other | Attending: Orthopedic Surgery | Admitting: Physical Therapy

## 2018-10-22 ENCOUNTER — Other Ambulatory Visit: Payer: Self-pay

## 2018-10-22 DIAGNOSIS — M25612 Stiffness of left shoulder, not elsewhere classified: Secondary | ICD-10-CM | POA: Diagnosis not present

## 2018-10-22 DIAGNOSIS — R252 Cramp and spasm: Secondary | ICD-10-CM | POA: Diagnosis not present

## 2018-10-22 DIAGNOSIS — M25512 Pain in left shoulder: Secondary | ICD-10-CM | POA: Insufficient documentation

## 2018-10-22 DIAGNOSIS — R600 Localized edema: Secondary | ICD-10-CM | POA: Diagnosis not present
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
## 2018-10-26 ENCOUNTER — Encounter: Payer: Medicare Other | Admitting: Physical Therapy

## 2018-10-28 ENCOUNTER — Ambulatory Visit: Payer: Medicare Other | Admitting: Physical Therapy

## 2018-10-28 ENCOUNTER — Other Ambulatory Visit: Payer: Self-pay

## 2018-10-28 ENCOUNTER — Encounter: Payer: Self-pay | Admitting: Physical Therapy

## 2018-10-28 DIAGNOSIS — R252 Cramp and spasm: Secondary | ICD-10-CM

## 2018-10-28 DIAGNOSIS — R600 Localized edema: Secondary | ICD-10-CM | POA: Diagnosis not present

## 2018-10-28 DIAGNOSIS — M25612 Stiffness of left shoulder, not elsewhere classified: Secondary | ICD-10-CM | POA: Diagnosis not present

## 2018-10-28 DIAGNOSIS — M25512 Pain in left shoulder: Secondary | ICD-10-CM

## 2018-10-28 NOTE — Therapy (Signed)
[facility] [place] [address] [place], [place], [address] Phone: [phone]   Fax:  [phone]  Physical Therapy Treatment  Patient Details  Name: Dawn Lane MRN: [id number] Date of Birth: September 16, 1950 Referring Provider (PT): Dr. Luiz Blare   Encounter Date: 10/28/2018  PT End of Session - 10/28/18 1121    Visit Number  2    Date for PT Re-Evaluation  12/22/18    PT Start Time  1055    PT Stop Time  1130    PT Time Calculation (min)  35 min    Activity Tolerance  Patient limited by lethargy;Patient limited by pain    Behavior During Therapy  Anxious       Past Medical History:  Diagnosis Date  . Anxiety 2011  . Arthritis   . Asthma   . Emphysema   . Hemorrhoids   . Polysubstance abuse (HCC) 2011   [facility] day [facility] admission for this. Cocaine, ETOH, MJA,   . Tobacco use     Past Surgical History:  Procedure Laterality Date  . COLONOSCOPY N/A 08/07/2013   Procedure: COLONOSCOPY;  Surgeon: Iva Boop, MD;  Location: [facility] ENDOSCOPY;  Service: Endoscopy;  Laterality: N/A;  . INCISION AND DRAINAGE  12/2009   of thrombosed internal hemorrhoid  . rc repair Left 10/14/2018   by Dr. Luiz Blare  . TUBAL LIGATION      There were no vitals filed for this visit.  Subjective Assessment - 10/28/18 1054    Subjective  "My neck is tight and im stiff"    Currently in Pain?  No/denies    Pain Location  Shoulder    Pain Orientation  Left    Pain Descriptors / Indicators  Tightness                       [facility] Adult PT Treatment/Exercise - 10/28/18 0001      Modalities   Modalities  Cryotherapy      Cryotherapy   Number Minutes Cryotherapy  10 Minutes    Cryotherapy Location  Shoulder    Type of Cryotherapy  Ice pack      Manual Therapy   Manual Therapy  Passive ROM    Manual therapy comments  Pt very guarded    Passive ROM  L shoulder all directions within protocol               PT Short  Term Goals - 10/22/18 1654      PT SHORT TERM GOAL #1   Title  understand protocol to heal RC    Time  2    Period  Weeks    Status  New        PT Long Term Goals - 10/22/18 1654      PT LONG TERM GOAL #1   Title  independent with HEP    Time  8    Period  Weeks    Status  New      PT LONG TERM GOAL #2   Title  increase AROM to 140 degrees flexion    Time  12    Period  Weeks    Status  New      PT LONG TERM GOAL #3   Title  increase AROM ER to 60 degrees    Time  8    Period  Weeks    Status  New      PT  LONG TERM GOAL #4   Title  report no difficulty with doing hair and dressing    Time  8    Period  Weeks    Status  New            Plan - 10/28/18 1121    Clinical Impression Statement  Patient reports that she has not been doing her HEP, so I reinforced the importance of her doing the HEP. She also reports that she hardly wears her sling and that she has been using her L arm. Again pt was educated on the importance of her wearing her sling and for her not to use her L arm due to her [facility] repair. L shoulder jt moves freely when pt relaxes. She is very guarded during MT and impulsive to resist PROM. She reports that she des not like pain.    Stability/Clinical Decision Making  Evolving/Moderate complexity    Rehab Potential  Good    PT Frequency  2x / week    PT Treatment/Interventions  ADLs/Self Care Home Management;Cryotherapy;Electrical Stimulation;Therapeutic activities;Therapeutic exercise;Manual techniques;Vasopneumatic Device;Patient/family education    PT Next Visit Plan  PROM for the first 6 weeks, really need to make sure she is following precautions       Patient will benefit from skilled therapeutic intervention in order to improve the following deficits and impairments:  Pain, Improper body mechanics, Decreased scar mobility, Increased muscle spasms, Impaired tone, Postural dysfunction, Decreased range of motion, Decreased strength, Impaired UE  functional use  Visit Diagnosis: Cramp and spasm  Stiffness of left shoulder, not elsewhere classified  Acute pain of left shoulder     Problem List Patient Active Problem List   Diagnosis Date Noted  . COPD active smoker/ pfts pending 01/04/2016  . Colitis 08/04/2013  . Abdominal pain 08/04/2013  . Nausea, vomiting and diarrhea 08/04/2013  . Cigarette smoker 08/04/2013  . Dyspnea 08/02/2010    Scot Jun, PTA 10/28/2018, 11:31 AM  [place] [address] [place], [place], [address] Phone: [phone]   Fax:  [phone]  Name: Dawn Lane MRN: [id number] Date of Birth: 03-25-1950

## 2018-10-30 ENCOUNTER — Ambulatory Visit: Payer: Medicare Other | Admitting: Physical Therapy

## 2018-11-03 ENCOUNTER — Ambulatory Visit: Payer: Medicare Other | Admitting: Physical Therapy

## 2018-11-03 ENCOUNTER — Other Ambulatory Visit: Payer: Self-pay

## 2018-11-03 DIAGNOSIS — R252 Cramp and spasm: Secondary | ICD-10-CM

## 2018-11-03 DIAGNOSIS — M25612 Stiffness of left shoulder, not elsewhere classified: Secondary | ICD-10-CM | POA: Diagnosis not present

## 2018-11-03 DIAGNOSIS — M25512 Pain in left shoulder: Secondary | ICD-10-CM

## 2018-11-03 DIAGNOSIS — R600 Localized edema: Secondary | ICD-10-CM | POA: Diagnosis not present

## 2018-11-03 NOTE — Therapy (Signed)
[facility] [place] [address] [place], [place], [address] Phone: [phone]   Fax:  [phone]  Physical Therapy Treatment  Patient Details  Name: Dawn Lane MRN: [id number] Date of Birth: 1950/10/06 Referring Provider (PT): Dr. Luiz Blare   Encounter Date: 11/03/2018  PT End of Session - 11/03/18 1033    Visit Number  3    Date for PT Re-Evaluation  12/22/18    PT Start Time  1015    PT Stop Time  1100    PT Time Calculation (min)  45 min       Past Medical History:  Diagnosis Date  . Anxiety 2011  . Arthritis   . Asthma   . Emphysema   . Hemorrhoids   . Polysubstance abuse (HCC) 2011   [facility] day [facility] admission for this. Cocaine, ETOH, MJA,   . Tobacco use     Past Surgical History:  Procedure Laterality Date  . COLONOSCOPY N/A 08/07/2013   Procedure: COLONOSCOPY;  Surgeon: Iva Boop, MD;  Location: [facility] ENDOSCOPY;  Service: Endoscopy;  Laterality: N/A;  . INCISION AND DRAINAGE  12/2009   of thrombosed internal hemorrhoid  . rc repair Left 10/14/2018   by Dr. Luiz Blare  . TUBAL LIGATION      There were no vitals filed for this visit.  Subjective Assessment - 11/03/18 1031    Subjective  i fell a couple days ago, slipped on wet floor and hit shld , now I can not lift it    Currently in Pain?  Yes    Pain Score  10-Worst pain ever    Pain Orientation  Left                       [facility] Adult PT Treatment/Exercise - 11/03/18 0001      Modalities   Modalities  Vasopneumatic      Vasopneumatic   Number Minutes Vasopneumatic   10 minutes    Vasopnuematic Location   Shoulder    Vasopneumatic Pressure  Medium    Vasopneumatic Temperature   34      Manual Therapy   Manual Therapy  Joint mobilization;Passive ROM    Manual therapy comments  verb and tactile cuing to relax    Joint Mobilization  distratcion and jt osscilations    Passive ROM  L shoulder all directions within protocol                PT Short Term Goals - 10/22/18 1654      PT SHORT TERM GOAL #1   Title  understand protocol to heal RC    Time  2    Period  Weeks    Status  New        PT Long Term Goals - 10/22/18 1654      PT LONG TERM GOAL #1   Title  independent with HEP    Time  8    Period  Weeks    Status  New      PT LONG TERM GOAL #2   Title  increase AROM to 140 degrees flexion    Time  12    Period  Weeks    Status  New      PT LONG TERM GOAL #3   Title  increase AROM ER to 60 degrees    Time  8    Period  Weeks    Status  New      PT LONG TERM GOAL #4   Title  report no difficulty with doing hair and dressing    Time  8    Period  Weeks    Status  New            Plan - 11/03/18 1034    Clinical Impression Statement  pt very guarded with mvmt but did better as session progressed with cuing. pt arrived with sling on very poorly an dstated ' since fall can not use arm" explained to pt she can not and should not use arm at shld, can move elbow and should have sling on . pt VU after education. pt sees MD next week. also edcu pt on correct sling wearing and application.    PT Treatment/Interventions  ADLs/Self Care Home Management;Cryotherapy;Electrical Stimulation;Therapeutic activities;Therapeutic exercise;Manual techniques;Vasopneumatic Device;Patient/family education    PT Next Visit Plan  PROM for the first 6 weeks, really need to make sure she is following precautions- chcek ROM for MD note       Patient will benefit from skilled therapeutic intervention in order to improve the following deficits and impairments:  Pain, Improper body mechanics, Decreased scar mobility, Increased muscle spasms, Impaired tone, Postural dysfunction, Decreased range of motion, Decreased strength, Impaired UE functional use  Visit Diagnosis: Cramp and spasm  Stiffness of left shoulder, not elsewhere classified  Acute pain of left shoulder     Problem List Patient Active  Problem List   Diagnosis Date Noted  . COPD active smoker/ pfts pending 01/04/2016  . Colitis 08/04/2013  . Abdominal pain 08/04/2013  . Nausea, vomiting and diarrhea 08/04/2013  . Cigarette smoker 08/04/2013  . Dyspnea 08/02/2010    PAYSEUR,ANGIE PTA 11/03/2018, 10:41 AM  [place] [address] [place], [place], [address] Phone: [phone]   Fax:  [phone]  Name: Dawn Lane MRN: [id number] Date of Birth: Jun 06, 1950

## 2018-11-05 ENCOUNTER — Ambulatory Visit: Payer: Medicare Other | Admitting: Physical Therapy

## 2018-11-05 ENCOUNTER — Other Ambulatory Visit: Payer: Self-pay

## 2018-11-05 DIAGNOSIS — M25512 Pain in left shoulder: Secondary | ICD-10-CM | POA: Diagnosis not present

## 2018-11-05 DIAGNOSIS — M25612 Stiffness of left shoulder, not elsewhere classified: Secondary | ICD-10-CM

## 2018-11-05 DIAGNOSIS — R252 Cramp and spasm: Secondary | ICD-10-CM | POA: Diagnosis not present

## 2018-11-05 DIAGNOSIS — R600 Localized edema: Secondary | ICD-10-CM | POA: Diagnosis not present

## 2018-11-05 NOTE — Therapy (Signed)
[place], [place], [address] Phone: [phone]   Fax:  [phone]  Physical Therapy Treatment  Patient Details  Name: Dawn Lane MRN: [id number] Date of Birth: 1950-11-27 Referring Provider (PT): Dr. Berenice Primas   Encounter Date: 11/05/2018  PT End of Session - 11/05/18 1132    Visit Number  4    Date for PT Re-Evaluation  12/22/18    PT Start Time  1100    PT Stop Time  1145    PT Time Calculation (min)  45 min       Past Medical History:  Diagnosis Date  . Anxiety 2011  . Arthritis   . Asthma   . Emphysema   . Hemorrhoids   . Polysubstance abuse ([place]) 2011   [facility] day [facility] admission for this. Cocaine, ETOH, MJA,   . Tobacco use     Past Surgical History:  Procedure Laterality Date  . COLONOSCOPY N/A 08/07/2013   Procedure: COLONOSCOPY;  Surgeon: Gatha Mayer, MD;  Location: [place];  Service: Endoscopy;  Laterality: N/A;  . INCISION AND DRAINAGE  12/2009   of thrombosed internal hemorrhoid  . rc repair Left 10/14/2018   by Dr. Berenice Primas  . TUBAL LIGATION      There were no vitals filed for this visit.  Subjective Assessment - 11/05/18 1131    Subjective  I feel bettter, good work out last session. I have been not using it since last session.    Currently in Pain?  Yes    Pain Score  4     Pain Location  Shoulder    Pain Orientation  Left         [facility] PT Assessment - 11/05/18 0001      PROM   PROM Assessment Site  Shoulder    Right/Left Shoulder  Left   SUPINE   Left Shoulder Flexion  150 Degrees    Left Shoulder ABduction  101 Degrees    Left Shoulder Internal Rotation  60 Degrees    Left Shoulder External Rotation  31 Degrees                             PT Short Term Goals - 10/22/18 1654      PT SHORT TERM GOAL #1   Title  understand protocol to heal RC    Time  2    Period  Weeks    Status  New        PT Long Term  Goals - 10/22/18 1654      PT LONG TERM GOAL #1   Title  independent with HEP    Time  8    Period  Weeks    Status  New      PT LONG TERM GOAL #2   Title  increase AROM to 140 degrees flexion    Time  12    Period  Weeks    Status  New      PT LONG TERM GOAL #3   Title  increase AROM ER to 60 degrees    Time  8    Period  Weeks    Status  New      PT LONG TERM GOAL #4   Title  report no difficulty with doing hair and dressing    Time  8    Period  Weeks  Status  New            Plan - 11/05/18 1133    Clinical Impression Statement  pt states since last session sh ehas been trying to not use it as much as she stated last session she was using shld plus had a fall and hit shld. pt with great increase in PROM still needs cuing to relax verb adn tactile with spasms present.    PT Treatment/Interventions  ADLs/Self Care Home Management;Cryotherapy;Electrical Stimulation;Therapeutic activities;Therapeutic exercise;Manual techniques;Vasopneumatic Device;Patient/family education    PT Next Visit Plan  PROM for the first 6 weeks, really need to make sure she is following precautions- MD note sent with pt       Patient will benefit from skilled therapeutic intervention in order to improve the following deficits and impairments:  Pain, Improper body mechanics, Decreased scar mobility, Increased muscle spasms, Impaired tone, Postural dysfunction, Decreased range of motion, Decreased strength, Impaired UE functional use  Visit Diagnosis: Cramp and spasm  Stiffness of left shoulder, not elsewhere classified  Localized edema     Problem List Patient Active Problem List   Diagnosis Date Noted  . COPD active smoker/ pfts pending 01/04/2016  . Colitis 08/04/2013  . Abdominal pain 08/04/2013  . Nausea, vomiting and diarrhea 08/04/2013  . Cigarette smoker 08/04/2013  . Dyspnea 08/02/2010    [name] SPTA 11/05/2018, 11:37 AM  [facility] [place] [address] [place], [place], [address] Phone: [phone]   Fax:  [phone]  Name: Dawn Lane MRN: [id number] Date of Birth: [birth year]

## 2018-11-09 DIAGNOSIS — M545 Low back pain: Secondary | ICD-10-CM | POA: Diagnosis not present

## 2018-11-09 DIAGNOSIS — M62830 Muscle spasm of back: Secondary | ICD-10-CM | POA: Diagnosis not present

## 2018-11-09 DIAGNOSIS — Z79899 Other long term (current) drug therapy: Secondary | ICD-10-CM | POA: Diagnosis not present

## 2018-11-09 DIAGNOSIS — M25512 Pain in left shoulder: Secondary | ICD-10-CM | POA: Diagnosis not present

## 2018-11-09 DIAGNOSIS — M25511 Pain in right shoulder: Secondary | ICD-10-CM | POA: Diagnosis not present

## 2018-11-11 ENCOUNTER — Other Ambulatory Visit: Payer: Self-pay

## 2018-11-11 ENCOUNTER — Ambulatory Visit: Payer: Medicare Other | Attending: Orthopedic Surgery | Admitting: Physical Therapy

## 2018-11-11 DIAGNOSIS — R600 Localized edema: Secondary | ICD-10-CM | POA: Insufficient documentation

## 2018-11-11 DIAGNOSIS — R252 Cramp and spasm: Secondary | ICD-10-CM | POA: Insufficient documentation

## 2018-11-11 DIAGNOSIS — M25512 Pain in left shoulder: Secondary | ICD-10-CM | POA: Insufficient documentation

## 2018-11-11 DIAGNOSIS — M25612 Stiffness of left shoulder, not elsewhere classified: Secondary | ICD-10-CM | POA: Diagnosis not present

## 2018-11-11 NOTE — Therapy (Signed)
[place], [place], [address] Phone: [phone]   Fax:  [phone]  Physical Therapy Treatment  Patient Details  Name: Dawn Lane MRN: [id number] Date of Birth: 09-26-1950 Referring Provider (PT): Dr. Berenice Primas   Encounter Date: 11/11/2018  PT End of Session - 11/11/18 1131    Visit Number  5    PT Start Time  9379    PT Stop Time  1146    PT Time Calculation (min)  48 min       Past Medical History:  Diagnosis Date  . Anxiety 2011  . Arthritis   . Asthma   . Emphysema   . Hemorrhoids   . Polysubstance abuse ([place]) 2011   [facility] day [facility] admission for this. Cocaine, ETOH, MJA,   . Tobacco use     Past Surgical History:  Procedure Laterality Date  . COLONOSCOPY N/A 08/07/2013   Procedure: COLONOSCOPY;  Surgeon: Gatha Mayer, MD;  Location: [place];  Service: Endoscopy;  Laterality: N/A;  . INCISION AND DRAINAGE  12/2009   of thrombosed internal hemorrhoid  . rc repair Left 10/14/2018   by Dr. Berenice Primas  . TUBAL LIGATION      There were no vitals filed for this visit.  Subjective Assessment - 11/11/18 1059    Subjective  "feeling a little tight". pt states that her doctor has given her some new exercises.    Currently in Pain?  Yes    Pain Score  5     Pain Location  Shoulder    Pain Orientation  Left                       [facility] Adult PT Treatment/Exercise - 11/11/18 0001      Vasopneumatic   Number Minutes Vasopneumatic   15 minutes    Vasopnuematic Location   Shoulder    Vasopneumatic Pressure  Medium    Vasopneumatic Temperature   34      Manual Therapy   Manual Therapy  Joint mobilization;Passive ROM    Manual therapy comments  verb and tactile cuing to relax    Joint Mobilization  distratcion and jt osscilations    Passive ROM  L shoulder all directions within protocol             PT Education - 11/11/18 1142    Education Details  pt  educated on importance of not activiely moving shoulder    Person(s) Educated  Patient    Methods  Explanation    Comprehension  Verbalized understanding       PT Short Term Goals - 10/22/18 1654      PT SHORT TERM GOAL #1   Title  understand protocol to heal RC    Time  2    Period  Weeks    Status  New        PT Long Term Goals - 10/22/18 1654      PT LONG TERM GOAL #1   Title  independent with HEP    Time  8    Period  Weeks    Status  New      PT LONG TERM GOAL #2   Title  increase AROM to 140 degrees flexion    Time  12    Period  Weeks    Status  New      PT LONG TERM GOAL #3  Title  increase AROM ER to 60 degrees    Time  8    Period  Weeks    Status  New      PT LONG TERM GOAL #4   Title  report no difficulty with doing hair and dressing    Time  8    Period  Weeks    Status  New            Plan - 11/11/18 1134    Clinical Impression Statement  pt tolerated treatemtn well as demonstrated by no increase of pain with interventions. pt continues to need verb and tactile cues to relax, but is getting better. pt has no spasms or cramps with PROM in todays session.    Stability/Clinical Decision Making  Evolving/Moderate complexity    Rehab Potential  Good    PT Frequency  2x / week    PT Duration  8 weeks    PT Treatment/Interventions  ADLs/Self Care Home Management;Cryotherapy;Electrical Stimulation;Therapeutic activities;Therapeutic exercise;Manual techniques;Vasopneumatic Device;Patient/family education    PT Next Visit Plan  PROM for the first 6 weeks, really need to make sure she is following precautions. try isometric exercises.       Patient will benefit from skilled therapeutic intervention in order to improve the following deficits and impairments:  Pain, Improper body mechanics, Decreased scar mobility, Increased muscle spasms, Impaired tone, Postural dysfunction, Decreased range of motion, Decreased strength, Impaired UE functional  use  Visit Diagnosis: Stiffness of left shoulder, not elsewhere classified     Problem List Patient Active Problem List   Diagnosis Date Noted  . COPD active smoker/ pfts pending 01/04/2016  . Colitis 08/04/2013  . Abdominal pain 08/04/2013  . Nausea, vomiting and diarrhea 08/04/2013  . Cigarette smoker 08/04/2013  . Dyspnea 08/02/2010    [name], SPTA 11/11/2018, 11:49 AM  [facility] [place] [address] [place], [place], [address] Phone: [phone]   Fax:  [phone]  Name: Dawn Lane MRN: [id number] Date of Birth: March 22, 1950

## 2018-11-13 ENCOUNTER — Other Ambulatory Visit: Payer: Self-pay

## 2018-11-13 ENCOUNTER — Ambulatory Visit: Payer: Medicare Other | Admitting: Physical Therapy

## 2018-11-13 DIAGNOSIS — M25512 Pain in left shoulder: Secondary | ICD-10-CM | POA: Diagnosis not present

## 2018-11-13 DIAGNOSIS — R252 Cramp and spasm: Secondary | ICD-10-CM | POA: Diagnosis not present

## 2018-11-13 DIAGNOSIS — M25612 Stiffness of left shoulder, not elsewhere classified: Secondary | ICD-10-CM | POA: Diagnosis not present

## 2018-11-13 DIAGNOSIS — R600 Localized edema: Secondary | ICD-10-CM | POA: Diagnosis not present

## 2018-11-13 NOTE — Therapy (Signed)
[place], [place], [address] Phone: [phone]   Fax:  [phone]  Physical Therapy Treatment  Patient Details  Name: Dawn Lane MRN: [id number] Date of Birth: 1950/06/05 Referring Provider (PT): Dr. Berenice Primas   Encounter Date: 11/13/2018  PT End of Session - 11/13/18 1130    Visit Number  6    PT Start Time  1100    PT Stop Time  1140    PT Time Calculation (min)  40 min       Past Medical History:  Diagnosis Date  . Anxiety 2011  . Arthritis   . Asthma   . Emphysema   . Hemorrhoids   . Polysubstance abuse ([place]) 2011   [facility] day [facility] for this. Cocaine, ETOH, MJA,   . Tobacco use     Past Surgical History:  Procedure Laterality Date  . COLONOSCOPY N/A 08/07/2013   Procedure: COLONOSCOPY;  Surgeon: Gatha Mayer, MD;  Location: [place];  Service: Endoscopy;  Laterality: N/A;  . INCISION AND DRAINAGE  12/2009   of thrombosed internal hemorrhoid  . rc repair Left 10/14/2018   by Dr. Berenice Primas  . TUBAL LIGATION      There were no vitals filed for this visit.  Subjective Assessment - 11/13/18 1129    Subjective  "feeling tight'. pt states she did not sleep very well last night. She reports she has stop doing the exercises her doctor give her.    Currently in Pain?  Yes    Pain Score  5     Pain Location  Shoulder    Pain Orientation  Left                       [facility] Adult PT Treatment/Exercise - 11/13/18 0001      Vasopneumatic   Number Minutes Vasopneumatic   15 minutes    Vasopnuematic Location   Shoulder    Vasopneumatic Pressure  Medium    Vasopneumatic Temperature   34      Manual Therapy   Manual Therapy  Joint mobilization;Passive ROM    Manual therapy comments  verb and tactile cuing to relax    Joint Mobilization  distratcion and jt osscilations    Passive ROM  L shoulder all directions within protocol               PT  Short Term Goals - 11/13/18 1136      PT SHORT TERM GOAL #1   Title  understand protocol to heal RC    Status  Partially Met        PT Long Term Goals - 11/13/18 1136      PT LONG TERM GOAL #1   Title  independent with HEP    Status  Achieved      PT LONG TERM GOAL #2   Title  increase AROM to 140 degrees flexion    Status  On-going      PT LONG TERM GOAL #3   Title  increase AROM ER to 60 degrees    Status  On-going      PT LONG TERM GOAL #4   Title  report no difficulty with doing hair and dressing    Status  On-going            Plan - 11/13/18 1132    Clinical Impression Statement  pt continues to need verbal and  tactile cues to relax with PROM. pt has no increase of pain with interventions. pt had a spasm in the deltoid with shoulder abduction. It went away with ossicilations.    Stability/Clinical Decision Making  Evolving/Moderate complexity    Rehab Potential  Good    PT Frequency  2x / week    PT Duration  8 weeks    PT Treatment/Interventions  ADLs/Self Care Home Management;Cryotherapy;Electrical Stimulation;Therapeutic activities;Therapeutic exercise;Manual techniques;Vasopneumatic Device;Patient/family education    PT Next Visit Plan  PROM for the first 6 weeks, really need to make sure she is following precautions. try isometric exercises.       Patient will benefit from skilled therapeutic intervention in order to improve the following deficits and impairments:  Pain, Improper body mechanics, Decreased scar mobility, Increased muscle spasms, Impaired tone, Postural dysfunction, Decreased range of motion, Decreased strength, Impaired UE functional use  Visit Diagnosis: Stiffness of left shoulder, not elsewhere classified     Problem List Patient Active Problem List   Diagnosis Date Noted  . COPD active smoker/ pfts pending 01/04/2016  . Colitis 08/04/2013  . Abdominal pain 08/04/2013  . Nausea, vomiting and diarrhea 08/04/2013  . Cigarette  smoker 08/04/2013  . Dyspnea 08/02/2010    Barrett Henle, [place] 11/13/2018, 11:38 AM  [place] [address] [place], [place], [address] Phone: [phone]   Fax:  [phone]  Name: Dawn Lane MRN: [id number] Date of Birth: 1950/10/09

## 2018-11-17 ENCOUNTER — Ambulatory Visit: Payer: Medicare Other | Admitting: Physical Therapy

## 2018-11-17 ENCOUNTER — Other Ambulatory Visit: Payer: Self-pay

## 2018-11-17 DIAGNOSIS — M25612 Stiffness of left shoulder, not elsewhere classified: Secondary | ICD-10-CM

## 2018-11-17 DIAGNOSIS — R600 Localized edema: Secondary | ICD-10-CM | POA: Diagnosis not present

## 2018-11-17 DIAGNOSIS — M25512 Pain in left shoulder: Secondary | ICD-10-CM | POA: Diagnosis not present

## 2018-11-17 DIAGNOSIS — R252 Cramp and spasm: Secondary | ICD-10-CM | POA: Diagnosis not present

## 2018-11-17 NOTE — Therapy (Signed)
[place], [place], [address] Phone: [phone]   Fax:  [phone]  Physical Therapy Treatment  Patient Details  Name: Dawn Lane MRN: [id number] Date of Birth: [birth year] Referring Provider (PT): Dr. Berenice Primas   Encounter Date: 11/17/2018  PT End of Session - 11/17/18 1359    Visit Number  7    PT Start Time  3888    PT Stop Time  1414    PT Time Calculation (min)  45 min       Past Medical History:  Diagnosis Date  . Anxiety 2011  . Arthritis   . Asthma   . Emphysema   . Hemorrhoids   . Polysubstance abuse ([place]) 2011   4 day [facility] admission for this. Cocaine, ETOH, MJA,   . Tobacco use     Past Surgical History:  Procedure Laterality Date  . COLONOSCOPY N/A 08/07/2013   Procedure: COLONOSCOPY;  Surgeon: Gatha Mayer, MD;  Location: [facility];  Service: Endoscopy;  Laterality: N/A;  . INCISION AND DRAINAGE  12/2009   of thrombosed internal hemorrhoid  . rc repair Left 10/14/2018   by Dr. Berenice Primas  . TUBAL LIGATION      There were no vitals filed for this visit.  Subjective Assessment - 11/17/18 1358    Subjective  "feeling pretty good". pt states she was out cooking this weekend    Currently in Pain?  Yes    Pain Score  4     Pain Location  Shoulder    Pain Orientation  Left                       [facility] Adult PT Treatment/Exercise - 11/17/18 0001      Exercises   Exercises  Shoulder      Shoulder Exercises: Standing   Other Standing Exercises  hands on ball shoulder flexion and circles. right hand leds left hand x 15    Other Standing Exercises  AAROM on pulley, flex/abd/ER x 10   pt instructed to relax LUE and move RUE only     Vasopneumatic   Number Minutes Vasopneumatic   15 minutes    Vasopnuematic Location   Shoulder    Vasopneumatic Pressure  Medium    Vasopneumatic Temperature   34      Manual Therapy   Manual Therapy  Passive ROM     Manual therapy comments  verb and tactile cuing to relax    Passive ROM  L shoulder all directions within protocol               PT Short Term Goals - 11/13/18 1136      PT SHORT TERM GOAL #1   Title  understand protocol to heal RC    Status  Partially Met        PT Long Term Goals - 11/13/18 1136      PT LONG TERM GOAL #1   Title  independent with HEP    Status  Achieved      PT LONG TERM GOAL #2   Title  increase AROM to 140 degrees flexion    Status  On-going      PT LONG TERM GOAL #3   Title  increase AROM ER to 60 degrees    Status  On-going      PT LONG TERM GOAL #4   Title  report no difficulty with  doing hair and dressing    Status  On-going            Plan - 11/17/18 1447    Clinical Impression Statement  Pt able to progress to AAROM exercises with ball and pulley. pt needs cues with pulley exercises to increase technique and form. pt instructed to only move RUE. pt continues to need cues to relax with PROM. pt tolerated treatment well as demostrated by no increase in pain.    Stability/Clinical Decision Making  Evolving/Moderate complexity    Rehab Potential  Good    PT Frequency  2x / week    PT Duration  8 weeks    PT Treatment/Interventions  ADLs/Self Care Home Management;Cryotherapy;Electrical Stimulation;Therapeutic activities;Therapeutic exercise;Manual techniques;Vasopneumatic Device;Patient/family education    PT Next Visit Plan  PROM for the first 6 weeks, really need to make sure she is following precautions. try isometric exercises.       Patient will benefit from skilled therapeutic intervention in order to improve the following deficits and impairments:  Pain, Improper body mechanics, Decreased scar mobility, Increased muscle spasms, Impaired tone, Postural dysfunction, Decreased range of motion, Decreased strength, Impaired UE functional use  Visit Diagnosis: Stiffness of left shoulder, not elsewhere classified     Problem  List Patient Active Problem List   Diagnosis Date Noted  . COPD active smoker/ pfts pending 01/04/2016  . Colitis 08/04/2013  . Abdominal pain 08/04/2013  . Nausea, vomiting and diarrhea 08/04/2013  . Cigarette smoker 08/04/2013  . Dyspnea 08/02/2010    Barrett Henle, [place] 11/17/2018, 2:51 PM  [place], [place], [address] Phone: [phone]   Fax:  [phone]  Name: Dawn Lane MRN: [id number] Date of Birth: 1950/07/06

## 2018-11-19 ENCOUNTER — Ambulatory Visit: Payer: Medicare Other | Admitting: Physical Therapy

## 2018-11-19 ENCOUNTER — Other Ambulatory Visit: Payer: Self-pay

## 2018-11-19 DIAGNOSIS — M25612 Stiffness of left shoulder, not elsewhere classified: Secondary | ICD-10-CM

## 2018-11-19 DIAGNOSIS — M25512 Pain in left shoulder: Secondary | ICD-10-CM | POA: Diagnosis not present

## 2018-11-19 DIAGNOSIS — R252 Cramp and spasm: Secondary | ICD-10-CM

## 2018-11-19 DIAGNOSIS — R600 Localized edema: Secondary | ICD-10-CM | POA: Diagnosis not present

## 2018-11-19 NOTE — Therapy (Signed)
[place], [place], [address] Phone: [phone]   Fax:  [phone]  Physical Therapy Treatment  Patient Details  Name: Dawn Lane MRN: [id number] Date of Birth: [birth year] Referring Provider (PT): Dr. Berenice Primas   Encounter Date: 11/19/2018  PT End of Session - 11/19/18 1656    Visit Number  8    PT Start Time  4128    PT Stop Time  1703    PT Time Calculation (min)  48 min       Past Medical History:  Diagnosis Date  . Anxiety 2011  . Arthritis   . Asthma   . Emphysema   . Hemorrhoids   . Polysubstance abuse ([place]) 2011   [facility] day [facility] admission for this. Cocaine, ETOH, MJA,   . Tobacco use     Past Surgical History:  Procedure Laterality Date  . COLONOSCOPY N/A 08/07/2013   Procedure: COLONOSCOPY;  Surgeon: Gatha Mayer, MD;  Location: [facility];  Service: Endoscopy;  Laterality: N/A;  . INCISION AND DRAINAGE  12/2009   of thrombosed internal hemorrhoid  . rc repair Left 10/14/2018   by Dr. Berenice Primas  . TUBAL LIGATION      There were no vitals filed for this visit.  Subjective Assessment - 11/19/18 1616    Subjective  "feeling pretty good, no pain"    Currently in Pain?  No/denies                       [facility] Adult PT Treatment/Exercise - 11/19/18 0001      Shoulder Exercises: Seated   Other Seated Exercises  UBE 2 min each way   right arm doing the work     Shoulder Exercises: Standing   Extension  Strengthening;20 reps;Theraband    Theraband Level (Shoulder Extension)  Level 1 (Yellow)    Row  Strengthening;Both;10 reps;Theraband    Theraband Level (Shoulder Row)  Level 1 (Yellow)    Other Standing Exercises  hands on ball shoulder flexion and circles. right hand leds left hand x 15    Other Standing Exercises  shoulder shrugs w/ 1# 2 x 10      Vasopneumatic   Number Minutes Vasopneumatic   10 minutes    Vasopnuematic Location   Shoulder    Vasopneumatic Pressure  Medium    Vasopneumatic Temperature   34      Manual Therapy   Manual Therapy  Passive ROM    Manual therapy comments  verb and tactile cuing to relax    Passive ROM  L shoulder all directions within protocol               PT Short Term Goals - 11/13/18 1136      PT SHORT TERM GOAL #1   Title  understand protocol to heal RC    Status  Partially Met        PT Long Term Goals - 11/19/18 1704      PT LONG TERM GOAL #2   Title  increase AROM to 140 degrees flexion    Status  On-going      PT LONG TERM GOAL #3   Title  increase AROM ER to 60 degrees    Status  On-going      PT LONG TERM GOAL #4   Title  report no difficulty with doing hair and dressing    Status  Partially Met  Plan - 11/19/18 1659    Clinical Impression Statement  pt was progressed to standing retraction and shoulder extension exercises. pt needs verbal and tactile cues to improve form and technique. pt tried the UBE and was educated on the importance of only using the R shoulder. pt continutes to need cues to relax with PROM.    Stability/Clinical Decision Making  Evolving/Moderate complexity    Rehab Potential  Good    PT Frequency  2x / week    PT Duration  8 weeks    PT Treatment/Interventions  ADLs/Self Care Home Management;Cryotherapy;Electrical Stimulation;Therapeutic activities;Therapeutic exercise;Manual techniques;Vasopneumatic Device;Patient/family education    PT Next Visit Plan  PROM for the first 6 weeks, really need to make sure she is following precautions. try isometric exercises.       Patient will benefit from skilled therapeutic intervention in order to improve the following deficits and impairments:  Pain, Improper body mechanics, Decreased scar mobility, Increased muscle spasms, Impaired tone, Postural dysfunction, Decreased range of motion, Decreased strength, Impaired UE functional use  Visit Diagnosis: Stiffness of left shoulder, not  elsewhere classified  Cramp and spasm     Problem List Patient Active Problem List   Diagnosis Date Noted  . COPD active smoker/ pfts pending 01/04/2016  . Colitis 08/04/2013  . Abdominal pain 08/04/2013  . Nausea, vomiting and diarrhea 08/04/2013  . Cigarette smoker 08/04/2013  . Dyspnea 08/02/2010    Dawn Lane, [place] 11/19/2018, 5:05 PM  [place], [place], [address] Phone: [phone]   Fax:  [phone]  Name: Dawn Lane MRN: [id number] Date of Birth: [birth year]

## 2018-11-24 ENCOUNTER — Ambulatory Visit: Payer: Medicare Other | Admitting: Physical Therapy

## 2018-11-24 ENCOUNTER — Other Ambulatory Visit: Payer: Self-pay

## 2018-11-24 DIAGNOSIS — M25512 Pain in left shoulder: Secondary | ICD-10-CM | POA: Diagnosis not present

## 2018-11-24 DIAGNOSIS — M25612 Stiffness of left shoulder, not elsewhere classified: Secondary | ICD-10-CM | POA: Diagnosis not present

## 2018-11-24 DIAGNOSIS — R252 Cramp and spasm: Secondary | ICD-10-CM | POA: Diagnosis not present

## 2018-11-24 DIAGNOSIS — R600 Localized edema: Secondary | ICD-10-CM | POA: Diagnosis not present

## 2018-11-24 NOTE — Therapy (Signed)
[facility] [address] [place], [place], [address] Phone: [phone]   Fax:  [phone]  Physical Therapy Treatment  Patient Details  Name: Dawn Lane MRN: [id number] Date of Birth: 07/09/1950 Referring Provider (PT): Dr. Graves   Encounter Date: 11/24/2018  PT End of Session - 11/24/18 1351    Visit Number  9    PT Start Time  1310    PT Stop Time  1358    PT Time Calculation (min)  48 min       Past Medical History:  Diagnosis Date  . Anxiety 2011  . Arthritis   . Asthma   . Emphysema   . Hemorrhoids   . Polysubstance abuse ([facility]) 2011   [facility] day [facility] admission for this. Cocaine, ETOH, MJA,   . Tobacco use     Past Surgical History:  Procedure Laterality Date  . COLONOSCOPY N/A 08/07/2013   Procedure: COLONOSCOPY;  Surgeon: Carl E Gessner, MD;  [facility];  Service: Endoscopy;  Laterality: N/A;  . INCISION AND DRAINAGE  12/2009   of thrombosed internal hemorrhoid  . rc repair Left 10/14/2018   by Dr. Graves  . TUBAL LIGATION      There were no vitals filed for this visit.  Subjective Assessment - 11/24/18 1310    Subjective  "feeling stiff and in pain". 45 minutes late. pt stated she got lost. she also staes that she took two pain pills before driving over.    Currently in Pain?  Yes    Pain Score  7     Pain Location  Shoulder    Pain Orientation  Left                       [facility] Adult PT Treatment/Exercise - 11/24/18 0001      Shoulder Exercises: Supine   Other Supine Exercises  AAROM ER with cane x 10      Shoulder Exercises: Seated   Other Seated Exercises  UBE 3 min each way      Shoulder Exercises: Standing   Extension  Strengthening;20 reps;Theraband    Theraband Level (Shoulder Extension)  Level 2 (Red)    Row  Strengthening;Both;10 reps;Theraband    Theraband Level (Shoulder Row)  Level 2 (Red)    Other Standing Exercises  AAROM flex, ext,  and IR w/ cane x 10    Other Standing Exercises  finger ladder abd/flex x 3      Vasopneumatic   Number Minutes Vasopneumatic   10 minutes    Vasopnuematic Location   Shoulder    Vasopneumatic Pressure  Medium    Vasopneumatic Temperature   34      Manual Therapy   Manual Therapy  Passive ROM    Manual therapy comments  verb and tactile cuing to relax    Passive ROM  L shoulder all directions within protocol               PT Short Term Goals - 11/13/18 1136      PT SHORT TERM GOAL #1   Title  understand protocol to heal RC    Status  Partially Met        PT Long Term Goals - 11/19/18 1704      PT LONG TERM GOAL #2   Title  increase AROM to 140 degrees flexion    Status  On-going        PT LONG TERM GOAL #3   Title  increase AROM ER to 60 degrees    Status  On-going      PT LONG TERM GOAL #4   Title  report no difficulty with doing hair and dressing    Status  Partially Met            Plan - 11/24/18 1353    Clinical Impression Statement  pt is  at week six of rehab and progressed to AAROM exercises. she is able to perform flex/abd on finger ladder and flex/ext/ER/IR with the cane. pt attemped AAROM of ER with cane in standing but could not to it correctly even with cues. she was able to correctly perform it in supine. pt needs verbal and tactile cues for all exercises to increase form and technique. pt stills needs cues to relax with PROM.    Stability/Clinical Decision Making  Evolving/Moderate complexity    Rehab Potential  Good    PT Frequency  2x / week    PT Duration  8 weeks    PT Treatment/Interventions  ADLs/Self Care Home Management;Cryotherapy;Electrical Stimulation;Therapeutic activities;Therapeutic exercise;Manual techniques;Vasopneumatic Device;Patient/family education    PT Next Visit Plan  AAROM exercises       Patient will benefit from skilled therapeutic intervention in order to improve the following deficits and impairments:  Pain,  Improper body mechanics, Decreased scar mobility, Increased muscle spasms, Impaired tone, Postural dysfunction, Decreased range of motion, Decreased strength, Impaired UE functional use  Visit Diagnosis: Stiffness of left shoulder, not elsewhere classified  Cramp and spasm     Problem List Patient Active Problem List   Diagnosis Date Noted  . COPD active smoker/ pfts pending 01/04/2016  . Colitis 08/04/2013  . Abdominal pain 08/04/2013  . Nausea, vomiting and diarrhea 08/04/2013  . Cigarette smoker 08/04/2013  . Dyspnea 08/02/2010      11/24/2018, 1:58 PM  [facility] [address] [place], [place], [address] Phone: [phone]   Fax:  [phone]  Name: Melody M Troupe MRN: [id number] Date of Birth: 05/19/1950   

## 2018-11-27 ENCOUNTER — Encounter: Payer: Medicare Other | Admitting: Physical Therapy

## 2018-12-01 ENCOUNTER — Ambulatory Visit: Payer: Medicare Other | Admitting: Physical Therapy

## 2018-12-01 ENCOUNTER — Encounter: Payer: Self-pay | Admitting: Physical Therapy

## 2018-12-01 ENCOUNTER — Other Ambulatory Visit: Payer: Self-pay

## 2018-12-01 DIAGNOSIS — M25512 Pain in left shoulder: Secondary | ICD-10-CM | POA: Diagnosis not present

## 2018-12-01 DIAGNOSIS — R600 Localized edema: Secondary | ICD-10-CM | POA: Diagnosis not present

## 2018-12-01 DIAGNOSIS — M25612 Stiffness of left shoulder, not elsewhere classified: Secondary | ICD-10-CM

## 2018-12-01 DIAGNOSIS — R252 Cramp and spasm: Secondary | ICD-10-CM | POA: Diagnosis not present

## 2018-12-01 NOTE — Therapy (Signed)
[facility] [address] [place], [place], [address] Phone: [phone]   Fax:  [phone] Progress Note Reporting Period 10/22/18 to 12/01/18 for the first 10 visits  See note below for Objective Data and Assessment of Progress/Goals.      Physical Therapy Treatment  Patient Details  Name: Dawn Lane MRN: [id number] Date of Birth: 11/02/1950 Referring Provider (PT): Dr. Graves   Encounter Date: 12/01/2018  PT End of Session - 12/01/18 0829    Visit Number  10    Date for PT Re-Evaluation  12/22/18    PT Start Time  0756    PT Stop Time  0846    PT Time Calculation (min)  50 min    Activity Tolerance  Patient limited by pain    Behavior During Therapy  Anxious;WFL for tasks assessed/performed       Past Medical History:  Diagnosis Date  . Anxiety 2011  . Arthritis   . Asthma   . Emphysema   . Hemorrhoids   . Polysubstance abuse ([facility]) 2011   [facility] day [facility] for this. Cocaine, ETOH, MJA,   . Tobacco use     Past Surgical History:  Procedure Laterality Date  . COLONOSCOPY N/A 08/07/2013   Procedure: COLONOSCOPY;  Surgeon: Carl E Gessner, MD;  [facility];  Service: Endoscopy;  Laterality: N/A;  . INCISION AND DRAINAGE  12/2009   of thrombosed internal hemorrhoid  . rc repair Left 10/14/2018   by Dr. Graves  . TUBAL LIGATION      There were no vitals filed for this visit.  Subjective Assessment - 12/01/18 0758    Subjective  Patient reports that she has had some sharp biting pain at times.  She reports that she has been cooking a lot the past few days    Currently in Pain?  Yes    Pain Score  2     Pain Location  Shoulder    Pain Orientation  Left    Aggravating Factors   activity         [facility] PT Assessment - 12/01/18 0001      ROM / Strength   AROM / PROM / Strength  AROM      AROM   AROM Assessment Site  Shoulder    Right/Left Shoulder  Left    Left  Shoulder Flexion  110 Degrees    Left Shoulder ABduction  95 Degrees    Left Shoulder Internal Rotation  40 Degrees    Left Shoulder External Rotation  60 Degrees      PROM   Right/Left Shoulder  Left    Left Shoulder Flexion  150 Degrees    Left Shoulder ABduction  110 Degrees    Left Shoulder Internal Rotation  65 Degrees    Left Shoulder External Rotation  40 Degrees                   [facility] Adult PT Treatment/Exercise - 12/01/18 0001      Shoulder Exercises: ROM/Strengthening   UBE (Upper Arm Bike)  level 1 x 6 minutes    Wall Wash  with assist from right hand flexion, circles and overhead side to side, tried some scaption    Other ROM/Strengthening Exercises  pass small weighted ball around waist both ways for IR    Other ROM/Strengthening Exercises  Wand exercises all motions      Vasopneumatic   Number   Minutes Vasopneumatic   10 minutes    Vasopnuematic Location   Shoulder    Vasopneumatic Pressure  Medium    Vasopneumatic Temperature   38      Manual Therapy   Manual Therapy  Passive ROM    Manual therapy comments  verb and tactile cuing to relax    Joint Mobilization  distratcion and jt osscilations to help with relaxation    Passive ROM  L shoulder all directions within protocol               PT Short Term Goals - 11/13/18 1136      PT SHORT TERM GOAL #1   Title  understand protocol to heal RC    Status  Partially Met        PT Long Term Goals - 12/01/18 0832      PT LONG TERM GOAL #1   Title  independent with HEP    Status  Achieved      PT LONG TERM GOAL #2   Title  increase AROM to 140 degrees flexion    Status  Partially Met      PT LONG TERM GOAL #3   Title  increase AROM ER to 60 degrees    Status  Partially Met      PT LONG TERM GOAL #4   Title  report no difficulty with doing hair and dressing    Status  Partially Met            Plan - 12/01/18 0829    Clinical Impression Statement  Patient is in week 7 of RCR  protocol, we are advancing to AAROM/AROM, continuing PROM, we had concerns up front that she was doing too much and not following the protocol guidelines, at times she did not wear the sling and was doing a lot of cooking.  She seems to understand the protocol better now, she is mostly limited by pain in the left upper arm and the posterior shoulder, her PROM is very painful at end range but the end feel is open and not much resistance, just pain.  She is very tight in ER and abduction, doing well with flexion and IR    PT Next Visit Plan  will progress with protocol and try to gain ROM as tolerated    Consulted and Agree with Plan of Care  Patient       Patient will benefit from skilled therapeutic intervention in order to improve the following deficits and impairments:  Pain, Improper body mechanics, Decreased scar mobility, Increased muscle spasms, Impaired tone, Postural dysfunction, Decreased range of motion, Decreased strength, Impaired UE functional use  Visit Diagnosis: Stiffness of left shoulder, not elsewhere classified  Cramp and spasm  Acute pain of left shoulder  Localized edema     Problem List Patient Active Problem List   Diagnosis Date Noted  . COPD active smoker/ pfts pending 01/04/2016  . Colitis 08/04/2013  . Abdominal pain 08/04/2013  . Nausea, vomiting and diarrhea 08/04/2013  . Cigarette smoker 08/04/2013  . Dyspnea 08/02/2010    , W., PT 12/01/2018, 8:34 AM  [facility] [address] [place], [place], [address] Phone: [phone]   Fax:  [phone]  Name: Dawn Lane MRN: [id number] Date of Birth: 08/04/1950   

## 2018-12-08 ENCOUNTER — Other Ambulatory Visit: Payer: Self-pay

## 2018-12-08 ENCOUNTER — Ambulatory Visit: Payer: Medicare Other | Attending: Orthopedic Surgery | Admitting: Physical Therapy

## 2018-12-08 DIAGNOSIS — M25512 Pain in left shoulder: Secondary | ICD-10-CM | POA: Diagnosis not present

## 2018-12-08 DIAGNOSIS — E559 Vitamin D deficiency, unspecified: Secondary | ICD-10-CM | POA: Diagnosis not present

## 2018-12-08 DIAGNOSIS — M25511 Pain in right shoulder: Secondary | ICD-10-CM | POA: Diagnosis not present

## 2018-12-08 DIAGNOSIS — R600 Localized edema: Secondary | ICD-10-CM | POA: Diagnosis not present

## 2018-12-08 DIAGNOSIS — R252 Cramp and spasm: Secondary | ICD-10-CM | POA: Diagnosis not present

## 2018-12-08 DIAGNOSIS — M25612 Stiffness of left shoulder, not elsewhere classified: Secondary | ICD-10-CM

## 2018-12-08 DIAGNOSIS — M62830 Muscle spasm of back: Secondary | ICD-10-CM | POA: Diagnosis not present

## 2018-12-08 NOTE — Therapy (Signed)
[place], [place], [address] Phone: [phone]   Fax:  [phone]  Physical Therapy Treatment  Patient Details  Name: NICKOLA LENIG MRN: [id number] Date of Birth: [birth year] Referring Provider (PT): Dr. Berenice Primas   Encounter Date: 12/08/2018  PT End of Session - 12/08/18 1353    Visit Number  11    PT Start Time  4650    PT Stop Time  3546    PT Time Calculation (min)  44 min       Past Medical History:  Diagnosis Date  . Anxiety 2011  . Arthritis   . Asthma   . Emphysema   . Hemorrhoids   . Polysubstance abuse ([place]) 2011   [facility] day [facility] for this. Cocaine, ETOH, MJA,   . Tobacco use     Past Surgical History:  Procedure Laterality Date  . COLONOSCOPY N/A 08/07/2013   Procedure: COLONOSCOPY;  Surgeon: Gatha Mayer, MD;  Location: [facility];  Service: Endoscopy;  Laterality: N/A;  . INCISION AND DRAINAGE  12/2009   of thrombosed internal hemorrhoid  . rc repair Left 10/14/2018   by Dr. Berenice Primas  . TUBAL LIGATION      There were no vitals filed for this visit.  Subjective Assessment - 12/08/18 1309    Subjective  pt reports that she was really sore after last session. "doctor said the shoulder is still a little stiff and it is doing good"    Currently in Pain?  No/denies                       [facility] Adult PT Treatment/Exercise - 12/08/18 0001      Shoulder Exercises: Standing   Extension  Strengthening;Theraband;15 reps    Theraband Level (Shoulder Extension)  Level 2 (Red)    Row  Strengthening;Both;Theraband;20 reps    Theraband Level (Shoulder Row)  Level 2 (Red)    Other Standing Exercises  cabinet reaches flex/abd    Other Standing Exercises  finger ladder abd/flex, reaching for post it notes on wall      Shoulder Exercises: ROM/Strengthening   UBE (Upper Arm Bike)  level 2 x 6 minutes    Other ROM/Strengthening Exercises  AAROM IR and  flexion w/ cane      Manual Therapy   Manual Therapy  Passive ROM    Manual therapy comments  verb and tactile cuing to relax    Passive ROM  L shoulder all directions within protocol               PT Short Term Goals - 11/13/18 1136      PT SHORT TERM GOAL #1   Title  understand protocol to heal RC    Status  Partially Met        PT Long Term Goals - 12/01/18 5681      PT LONG TERM GOAL #1   Title  independent with HEP    Status  Achieved      PT LONG TERM GOAL #2   Title  increase AROM to 140 degrees flexion    Status  Partially Met      PT LONG TERM GOAL #3   Title  increase AROM ER to 60 degrees    Status  Partially Met      PT LONG TERM GOAL #4   Title  report no difficulty with doing hair and dressing  Status  Partially Met            Plan - 12/08/18 1357    Clinical Impression Statement  pt came in without her sling. pt continues to work toward increasing ROM. pt still needs cues to relax with PROM as she is very guarded. pt able to perform AAROM flexion and IR with cane. pt progressed to reaching exercises at the cabient and taking sticky notes off the wall. pt requires rest breaks secondary to fatigue and pain of left shoulder. pt refused vaso at the end of session today.    Stability/Clinical Decision Making  Evolving/Moderate complexity    Rehab Potential  Good    PT Frequency  2x / week    PT Duration  8 weeks    PT Treatment/Interventions  ADLs/Self Care Home Management;Cryotherapy;Electrical Stimulation;Therapeutic activities;Therapeutic exercise;Manual techniques;Vasopneumatic Device;Patient/family education    PT Next Visit Plan  will progress with protocol and try to gain ROM as tolerated       Patient will benefit from skilled therapeutic intervention in order to improve the following deficits and impairments:  Pain, Improper body mechanics, Decreased scar mobility, Increased muscle spasms, Impaired tone, Postural dysfunction,  Decreased range of motion, Decreased strength, Impaired UE functional use  Visit Diagnosis: Stiffness of left shoulder, not elsewhere classified     Problem List Patient Active Problem List   Diagnosis Date Noted  . COPD active smoker/ pfts pending 01/04/2016  . Colitis 08/04/2013  . Abdominal pain 08/04/2013  . Nausea, vomiting and diarrhea 08/04/2013  . Cigarette smoker 08/04/2013  . Dyspnea 08/02/2010    Barrett Henle, [place] 12/08/2018, 2:03 PM  [place], [place], [address] Phone: [phone]   Fax:  [phone]  Name: MILEENA ROTHENBERGER MRN: [id number] Date of Birth: Oct 20, 1950

## 2018-12-10 ENCOUNTER — Ambulatory Visit: Payer: Medicare Other | Admitting: Physical Therapy

## 2018-12-10 ENCOUNTER — Encounter: Payer: Self-pay | Admitting: Physical Therapy

## 2018-12-10 ENCOUNTER — Other Ambulatory Visit: Payer: Self-pay

## 2018-12-10 DIAGNOSIS — M25512 Pain in left shoulder: Secondary | ICD-10-CM | POA: Diagnosis not present

## 2018-12-10 DIAGNOSIS — R252 Cramp and spasm: Secondary | ICD-10-CM

## 2018-12-10 DIAGNOSIS — R600 Localized edema: Secondary | ICD-10-CM | POA: Diagnosis not present

## 2018-12-10 DIAGNOSIS — M25612 Stiffness of left shoulder, not elsewhere classified: Secondary | ICD-10-CM | POA: Diagnosis not present

## 2018-12-10 NOTE — Therapy (Signed)
[place], [place], [address] Phone: [phone]   Fax:  [phone]  Physical Therapy Treatment  Patient Details  Name: Dawn Lane MRN: [id number] Date of Birth: 1950/02/15 Referring Provider (PT): Dr. Berenice Primas   Encounter Date: 12/10/2018  PT End of Session - 12/10/18 1341    Visit Number  12    Date for PT Re-Evaluation  12/22/18    PT Start Time  1302    PT Stop Time  1342    PT Time Calculation (min)  40 min    Activity Tolerance  Patient limited by pain;Patient tolerated treatment well    Behavior During Therapy  Anxious;WFL for tasks assessed/performed       Past Medical History:  Diagnosis Date  . Anxiety 2011  . Arthritis   . Asthma   . Emphysema   . Hemorrhoids   . Polysubstance abuse ([place]) 2011   [facility] for this. Cocaine, ETOH, MJA,   . Tobacco use     Past Surgical History:  Procedure Laterality Date  . COLONOSCOPY N/A 08/07/2013   Procedure: COLONOSCOPY;  Surgeon: Gatha Mayer, MD;  Location: [facility];  Service: Endoscopy;  Laterality: N/A;  . INCISION AND DRAINAGE  12/2009   of thrombosed internal hemorrhoid  . rc repair Left 10/14/2018   by Dr. Berenice Primas  . TUBAL LIGATION      There were no vitals filed for this visit.  Subjective Assessment - 12/10/18 1307    Subjective  "Great"    Currently in Pain?  Yes    Pain Score  2     Pain Location  Shoulder    Pain Orientation  Left                       [facility] Adult PT Treatment/Exercise - 12/10/18 0001      Shoulder Exercises: Standing   Extension  Strengthening;Theraband;15 reps    Theraband Level (Shoulder Extension)  Level 2 (Red)    Row  Strengthening;Both;Theraband;20 reps    Theraband Level (Shoulder Row)  Level 2 (Red)    Other Standing Exercises  AAROM cane flex, ext, and IR up back x10       Shoulder Exercises: ROM/Strengthening   UBE (Upper Arm Bike)  level 1  x 6 minutes    Wall Wash  with assist from right hand flexion, circles and overhead side to side, tried some scaption    Other ROM/Strengthening Exercises  pass small weighted ball around waist both ways for IR      Manual Therapy   Manual Therapy  Passive ROM    Manual therapy comments  verb and tactile cuing to relax    Joint Mobilization  distratcion and jt osscilations to help with relaxation    Passive ROM  L shoulder all directions within protocol               PT Short Term Goals - 11/13/18 1136      PT SHORT TERM GOAL #1   Title  understand protocol to heal RC    Status  Partially Met        PT Long Term Goals - 12/01/18 7939      PT LONG TERM GOAL #1   Title  independent with HEP    Status  Achieved      PT LONG TERM GOAL #2   Title  increase  AROM to 140 degrees flexion    Status  Partially Met      PT LONG TERM GOAL #3   Title  increase AROM ER to 60 degrees    Status  Partially Met      PT LONG TERM GOAL #4   Title  report no difficulty with doing hair and dressing    Status  Partially Met            Plan - 12/10/18 1341    Clinical Impression Statement  Pt denied modality post treatment. Pt did well with cane activities, but some postural compensations noted. Some ROM limitation noted with wall wash. Tactile and verbal cues needed to maintain correct posture with rows and extensions.    Stability/Clinical Decision Making  Evolving/Moderate complexity    PT Next Visit Plan  will progress with protocol and try to gain ROM as tolerated       Patient will benefit from skilled therapeutic intervention in order to improve the following deficits and impairments:  Pain, Improper body mechanics, Decreased scar mobility, Increased muscle spasms, Impaired tone, Postural dysfunction, Decreased range of motion, Decreased strength, Impaired UE functional use  Visit Diagnosis: Acute pain of left shoulder  Localized edema  Cramp and spasm  Stiffness  of left shoulder, not elsewhere classified     Problem List Patient Active Problem List   Diagnosis Date Noted  . COPD active smoker/ pfts pending 01/04/2016  . Colitis 08/04/2013  . Abdominal pain 08/04/2013  . Nausea, vomiting and diarrhea 08/04/2013  . Cigarette smoker 08/04/2013  . Dyspnea 08/02/2010    Scot Jun, PTA 12/10/2018, 1:45 PM  [place] [address] [place], [place], [address] Phone: [phone]   Fax:  [phone]  Name: Dawn Lane MRN: [id number] Date of Birth: 10/30/1950

## 2018-12-15 ENCOUNTER — Ambulatory Visit: Payer: Medicare Other | Admitting: Physical Therapy

## 2018-12-15 ENCOUNTER — Other Ambulatory Visit: Payer: Self-pay

## 2018-12-15 DIAGNOSIS — M25612 Stiffness of left shoulder, not elsewhere classified: Secondary | ICD-10-CM | POA: Diagnosis not present

## 2018-12-15 DIAGNOSIS — M25512 Pain in left shoulder: Secondary | ICD-10-CM

## 2018-12-15 DIAGNOSIS — R600 Localized edema: Secondary | ICD-10-CM | POA: Diagnosis not present

## 2018-12-15 DIAGNOSIS — R252 Cramp and spasm: Secondary | ICD-10-CM | POA: Diagnosis not present

## 2018-12-15 NOTE — Therapy (Cosign Needed)
[address], [place], [address] Phone: [phone]   Fax:  [phone]  Physical Therapy Treatment  Patient Details  Name: Dawn Lane MRN: [id number] Date of Birth: August 21, 1950 Referring Provider (PT): Dr. Berenice Primas   Encounter Date: 12/15/2018  PT End of Session - 12/15/18 1148    Visit Number  13    Date for PT Re-Evaluation  12/22/18    PT Start Time  1105    PT Stop Time  1203    PT Time Calculation (min)  58 min       Past Medical History:  Diagnosis Date  . Anxiety 2011  . Arthritis   . Asthma   . Emphysema   . Hemorrhoids   . Polysubstance abuse ([facility]) 2011   [facility] day [facility] for this. Cocaine, ETOH, MJA,   . Tobacco use     Past Surgical History:  Procedure Laterality Date  . COLONOSCOPY N/A 08/07/2013   Procedure: COLONOSCOPY;  Surgeon: Gatha Mayer, MD;  Location: [facility];  Service: Endoscopy;  Laterality: N/A;  . INCISION AND DRAINAGE  12/2009   of thrombosed internal hemorrhoid  . rc repair Left 10/14/2018   by Dr. Berenice Primas  . TUBAL LIGATION      There were no vitals filed for this visit.  Subjective Assessment - 12/15/18 1106    Subjective  "feeling good'. pt states she took three pain pills before arriving today.    Limitations  House hold activities    Currently in Pain?  No/denies                       [facility] Adult PT Treatment/Exercise - 12/15/18 0001      Shoulder Exercises: Standing   Extension  Strengthening;Theraband;15 reps    Theraband Level (Shoulder Extension)  Level 3 (Green)    Row  Strengthening;Both;Theraband;20 reps    Theraband Level (Shoulder Row)  Level 3 (Green)    Other Standing Exercises  AAROM cane abd and IR up back x10     Other Standing Exercises  biceps and triceps on pulley, 20# , finger ladder flex/abd      Shoulder Exercises: ROM/Strengthening   UBE (Upper Arm Bike)  L2 x 4 min    Nustep  L4 x 4 min     Wall Wash  in flexion and abduction with holds for light stretch      Vasopneumatic   Number Minutes Vasopneumatic   15 minutes    Vasopnuematic Location   Shoulder    Vasopneumatic Pressure  Medium    Vasopneumatic Temperature   38      Manual Therapy   Manual Therapy  Passive ROM    Manual therapy comments  verb and tactile cuing to relax    Passive ROM  L shoulder all directions within protocol               PT Short Term Goals - 11/13/18 1136      PT SHORT TERM GOAL #1   Title  understand protocol to heal RC    Status  Partially Met        PT Long Term Goals - 12/01/18 0488      PT LONG TERM GOAL #1   Title  independent with HEP    Status  Achieved      PT LONG TERM GOAL #2   Title  increase AROM to  140 degrees flexion    Status  Partially Met      PT LONG TERM GOAL #3   Title  increase AROM ER to 60 degrees    Status  Partially Met      PT LONG TERM GOAL #4   Title  report no difficulty with doing hair and dressing    Status  Partially Met            Plan - 12/15/18 1150    Clinical Impression Statement  pt arrived five mintues late to the session. pt progressed to tricep and bicep curls on the pulley. pt needs tatile and verbal cues with bicep curls on the pulley. pt needs cues with AAROM abduction with cane to improve form and technique. pt needed cues for redirection as she is highly distractible today. pt complains of pain with abduction on finger ladder.    Stability/Clinical Decision Making  Evolving/Moderate complexity    Rehab Potential  Good    PT Frequency  2x / week    PT Duration  8 weeks    PT Treatment/Interventions  ADLs/Self Care Home Management;Cryotherapy;Electrical Stimulation;Therapeutic activities;Therapeutic exercise;Manual techniques;Vasopneumatic Device;Patient/family education    PT Next Visit Plan  will progress with protocol and try to gain ROM as tolerated       Patient will benefit from skilled therapeutic  intervention in order to improve the following deficits and impairments:  Pain, Improper body mechanics, Decreased scar mobility, Increased muscle spasms, Impaired tone, Postural dysfunction, Decreased range of motion, Decreased strength, Impaired UE functional use  Visit Diagnosis: Acute pain of left shoulder  Stiffness of left shoulder, not elsewhere classified     Problem List Patient Active Problem List   Diagnosis Date Noted  . COPD active smoker/ pfts pending 01/04/2016  . Colitis 08/04/2013  . Abdominal pain 08/04/2013  . Nausea, vomiting and diarrhea 08/04/2013  . Cigarette smoker 08/04/2013  . Dyspnea 08/02/2010    Barrett Henle, [place] 12/15/2018, 11:59 AM  [place], [place], [address] Phone: [phone]   Fax:  [phone]  Name: Dawn Lane MRN: [id number] Date of Birth: Nov 14, 1950

## 2018-12-17 ENCOUNTER — Ambulatory Visit: Payer: Medicare Other | Admitting: Physical Therapy

## 2018-12-17 ENCOUNTER — Other Ambulatory Visit: Payer: Self-pay

## 2018-12-17 DIAGNOSIS — R252 Cramp and spasm: Secondary | ICD-10-CM | POA: Diagnosis not present

## 2018-12-17 DIAGNOSIS — M25612 Stiffness of left shoulder, not elsewhere classified: Secondary | ICD-10-CM

## 2018-12-17 DIAGNOSIS — M25512 Pain in left shoulder: Secondary | ICD-10-CM | POA: Diagnosis not present

## 2018-12-17 DIAGNOSIS — R600 Localized edema: Secondary | ICD-10-CM | POA: Diagnosis not present

## 2018-12-17 NOTE — Therapy (Signed)
[place], [place], [address] Phone: [phone]   Fax:  [phone]  Physical Therapy Treatment  Patient Details  Name: Dawn Lane MRN: [id number] Date of Birth: July 04, 1950 Referring Provider (PT): Dr. Berenice Primas   Encounter Date: 12/17/2018  PT End of Session - 12/17/18 1058    Visit Number  14    Date for PT Re-Evaluation  12/22/18    PT Start Time  6389    PT Stop Time  1107    PT Time Calculation (min)  52 min       Past Medical History:  Diagnosis Date  . Anxiety 2011  . Arthritis   . Asthma   . Emphysema   . Hemorrhoids   . Polysubstance abuse ([place]) 2011   [facility] day [facility] for this. Cocaine, ETOH, MJA,   . Tobacco use     Past Surgical History:  Procedure Laterality Date  . COLONOSCOPY N/A 08/07/2013   Procedure: COLONOSCOPY;  Surgeon: Gatha Mayer, MD;  Location: [facility];  Service: Endoscopy;  Laterality: N/A;  . INCISION AND DRAINAGE  12/2009   of thrombosed internal hemorrhoid  . rc repair Left 10/14/2018   by Dr. Berenice Primas  . TUBAL LIGATION      There were no vitals filed for this visit.  Subjective Assessment - 12/17/18 1015    Subjective  "feeling pretty good".    Limitations  House hold activities    Patient Stated Goals  have less pain, better ROM and be able to dress and do hair on own    Currently in Pain?  No/denies         [facility] PT Assessment - 12/17/18 0001      AROM   Left Shoulder Flexion  125 Degrees    Left Shoulder External Rotation  60 Degrees                   [facility] Adult PT Treatment/Exercise - 12/17/18 0001      Shoulder Exercises: Standing   Extension  Strengthening;Theraband;15 reps    Theraband Level (Shoulder Extension)  Level 3 (Green)    Row  Strengthening;Both;Theraband;20 reps    Theraband Level (Shoulder Row)  Level 3 (Green)    Other Standing Exercises  AAROM cane  IR up back x10, cabinet stretch,  scap stabilization    Other Standing Exercises  biceps and triceps on pulley, 20# , finger ladder flex/abd      Shoulder Exercises: ROM/Strengthening   UBE (Upper Arm Bike)  L3 x  6 min      Cryotherapy   Number Minutes Cryotherapy  10 Minutes    Cryotherapy Location  Shoulder    Type of Cryotherapy  Ice pack      Manual Therapy   Manual Therapy  Soft tissue mobilization;Passive ROM    Manual therapy comments  verb and tactile cuing to relax    Passive ROM  L shoulder all directions within protocol, TP in left upper trap into cervical area               PT Short Term Goals - 12/17/18 1053      PT SHORT TERM GOAL #1   Title  understand protocol to heal RC    Status  Achieved        PT Long Term Goals - 12/17/18 1053      PT LONG TERM GOAL #2   Title  increase AROM to 140 degrees flexion    Status  Partially Met      PT LONG TERM GOAL #3   Title  increase AROM ER to 60 degrees    Status  Partially Met      PT LONG TERM GOAL #4   Title  report no difficulty with doing hair and dressing    Status  Partially Met            Plan - 12/17/18 1136    Clinical Impression Statement  pt continues to work on increasing ROM and strength. pt performed scapular stabilization with theraband with limited motion of the left shoulder. pt was tight with STM and has multiple TP in the left upper trap into the cervical spine. pt tolerated STM well. pt is making progress with AROM of shoulder flexion with ER remaining the same.    Stability/Clinical Decision Making  Evolving/Moderate complexity    Rehab Potential  Good    PT Frequency  2x / week    PT Treatment/Interventions  ADLs/Self Care Home Management;Cryotherapy;Electrical Stimulation;Therapeutic activities;Therapeutic exercise;Manual techniques;Vasopneumatic Device;Patient/family education    PT Next Visit Plan  increase strength and ROM, functional exercises    Consulted and Agree with Plan of Care  Patient        Patient will benefit from skilled therapeutic intervention in order to improve the following deficits and impairments:  Pain, Improper body mechanics, Decreased scar mobility, Increased muscle spasms, Impaired tone, Postural dysfunction, Decreased range of motion, Decreased strength, Impaired UE functional use  Visit Diagnosis: Acute pain of left shoulder  Stiffness of left shoulder, not elsewhere classified     Problem List Patient Active Problem List   Diagnosis Date Noted  . COPD active smoker/ pfts pending 01/04/2016  . Colitis 08/04/2013  . Abdominal pain 08/04/2013  . Nausea, vomiting and diarrhea 08/04/2013  . Cigarette smoker 08/04/2013  . Dyspnea 08/02/2010    Barrett Henle, [place] 12/17/2018, 11:42 AM  [place] [address] [place], [place], [address] Phone: [phone]   Fax:  [phone]  Name: Dawn Lane MRN: [id number] Date of Birth: [birth year]

## 2018-12-18 ENCOUNTER — Encounter

## 2018-12-22 ENCOUNTER — Ambulatory Visit: Payer: Medicare Other | Admitting: Physical Therapy

## 2018-12-22 ENCOUNTER — Other Ambulatory Visit: Payer: Self-pay

## 2018-12-22 DIAGNOSIS — M25512 Pain in left shoulder: Secondary | ICD-10-CM

## 2018-12-22 DIAGNOSIS — R600 Localized edema: Secondary | ICD-10-CM | POA: Diagnosis not present

## 2018-12-22 DIAGNOSIS — M25612 Stiffness of left shoulder, not elsewhere classified: Secondary | ICD-10-CM | POA: Diagnosis not present

## 2018-12-22 DIAGNOSIS — R252 Cramp and spasm: Secondary | ICD-10-CM | POA: Diagnosis not present

## 2018-12-22 NOTE — Therapy (Signed)
[place], [place], [address] Phone: [phone]   Fax:  [phone]  Physical Therapy Treatment  Patient Details  Name: Dawn Lane MRN: [id number] Date of Birth: [birth year] Referring Provider (PT): Dr. Berenice Primas   Encounter Date: 12/22/2018  PT End of Session - 12/22/18 1355    Visit Number  15    Date for PT Re-Evaluation  12/22/18    PT Start Time  1310    PT Stop Time  1405    PT Time Calculation (min)  55 min       Past Medical History:  Diagnosis Date  . Anxiety 2011  . Arthritis   . Asthma   . Emphysema   . Hemorrhoids   . Polysubstance abuse ([place]) 2011   [facility] day [facility] for this. Cocaine, ETOH, MJA,   . Tobacco use     Past Surgical History:  Procedure Laterality Date  . COLONOSCOPY N/A 08/07/2013   Procedure: COLONOSCOPY;  Surgeon: Gatha Mayer, MD;  Location: [facility];  Service: Endoscopy;  Laterality: N/A;  . INCISION AND DRAINAGE  12/2009   of thrombosed internal hemorrhoid  . rc repair Left 10/14/2018   by Dr. Berenice Primas  . TUBAL LIGATION      There were no vitals filed for this visit.  Subjective Assessment - 12/22/18 1313    Subjective  "feeling pretty good".    Currently in Pain?  No/denies                       [facility] Adult PT Treatment/Exercise - 12/22/18 0001      Shoulder Exercises: Standing   External Rotation  Strengthening;Both;20 reps;Theraband    Theraband Level (Shoulder External Rotation)  Level 2 (Red)    Internal Rotation  Strengthening;Both;20 reps;Theraband    Theraband Level (Shoulder Internal Rotation)  Level 2 (Red)    Flexion  Strengthening;Both;20 reps;Weights    Shoulder Flexion Weight (lbs)  2    ABduction  Strengthening;Both;20 reps;Weights    Shoulder ABduction Weight (lbs)  2    Other Standing Exercises  OHP and chest press w/ weighted ball 2 x 10    Other Standing Exercises  bicep curls L arm 5# 2 x  10      Shoulder Exercises: ROM/Strengthening   UBE (Upper Arm Bike)  L3 x  7 min      Cryotherapy   Number Minutes Cryotherapy  10 Minutes    Cryotherapy Location  Shoulder    Type of Cryotherapy  Ice pack      Manual Therapy   Manual Therapy  Soft tissue mobilization;Passive ROM    Manual therapy comments  verb and tactile cuing to relax    Passive ROM  L shoulder all directions within protocol, TP in left upper trap into cervical area               PT Short Term Goals - 12/17/18 1053      PT SHORT TERM GOAL #1   Title  understand protocol to heal RC    Status  Achieved        PT Long Term Goals - 12/17/18 1053      PT LONG TERM GOAL #2   Title  increase AROM to 140 degrees flexion    Status  Partially Met      PT LONG TERM GOAL #3   Title  increase AROM ER  to 60 degrees    Status  Partially Met      PT LONG TERM GOAL #4   Title  report no difficulty with doing hair and dressing    Status  Partially Met            Plan - 12/22/18 1357    Clinical Impression Statement  pt progressed to exercise with light weights. pt has the most difficulty with shoulder abduction. she states that she feels a pulling in shoulder with abduction. pt able to perform OHP and chest press. she has limited motion with OHP due to tighness and weakness. pt PROM continues to improve. pt to be renewed for PT.    Stability/Clinical Decision Making  Evolving/Moderate complexity    Rehab Potential  Good    PT Frequency  2x / week    PT Duration  8 weeks    PT Treatment/Interventions  ADLs/Self Care Home Management;Cryotherapy;Electrical Stimulation;Therapeutic activities;Therapeutic exercise;Manual techniques;Vasopneumatic Device;Patient/family education    PT Next Visit Plan  increase strength and ROM, functional exercises, pt to be renewed    Consulted and Agree with Plan of Care  Patient       Patient will benefit from skilled therapeutic intervention in order to improve the  following deficits and impairments:  Pain, Improper body mechanics, Decreased scar mobility, Increased muscle spasms, Impaired tone, Postural dysfunction, Decreased range of motion, Decreased strength, Impaired UE functional use  Visit Diagnosis: Stiffness of left shoulder, not elsewhere classified  Acute pain of left shoulder     Problem List Patient Active Problem List   Diagnosis Date Noted  . COPD active smoker/ pfts pending 01/04/2016  . Colitis 08/04/2013  . Abdominal pain 08/04/2013  . Nausea, vomiting and diarrhea 08/04/2013  . Cigarette smoker 08/04/2013  . Dyspnea 08/02/2010    Barrett Henle, [place] 12/22/2018, 2:05 PM  [place], [place], [address] Phone: [phone]   Fax:  [phone]  Name: Dawn Lane MRN: [id number] Date of Birth: [birth year]

## 2018-12-24 ENCOUNTER — Other Ambulatory Visit: Payer: Self-pay

## 2018-12-24 ENCOUNTER — Ambulatory Visit: Payer: Medicare Other | Admitting: Physical Therapy

## 2018-12-24 DIAGNOSIS — R600 Localized edema: Secondary | ICD-10-CM | POA: Diagnosis not present

## 2018-12-24 DIAGNOSIS — M25612 Stiffness of left shoulder, not elsewhere classified: Secondary | ICD-10-CM

## 2018-12-24 DIAGNOSIS — R252 Cramp and spasm: Secondary | ICD-10-CM

## 2018-12-24 DIAGNOSIS — M25512 Pain in left shoulder: Secondary | ICD-10-CM | POA: Diagnosis not present

## 2018-12-24 NOTE — Therapy (Signed)
[place], [place], [address] Phone: [phone]   Fax:  [phone]  Physical Therapy Treatment  Patient Details  Name: Dawn Lane MRN: [id number] Date of Birth: [birth year] Referring Provider (PT): Dr. Berenice Primas   Encounter Date: 12/24/2018  PT End of Session - 12/24/18 1233    Visit Number  16    PT Start Time  2025    PT Stop Time  1329    PT Time Calculation (min)  58 min       Past Medical History:  Diagnosis Date  . Anxiety 2011  . Arthritis   . Asthma   . Emphysema   . Hemorrhoids   . Polysubstance abuse ([place]) 2011   [facility] day [facility] admission for this. Cocaine, ETOH, MJA,   . Tobacco use     Past Surgical History:  Procedure Laterality Date  . COLONOSCOPY N/A 08/07/2013   Procedure: COLONOSCOPY;  Surgeon: Gatha Mayer, MD;  Location: [facility];  Service: Endoscopy;  Laterality: N/A;  . INCISION AND DRAINAGE  12/2009   of thrombosed internal hemorrhoid  . rc repair Left 10/14/2018   by Dr. Berenice Primas  . TUBAL LIGATION      There were no vitals filed for this visit.      [facility] PT Assessment - 12/24/18 0001      Assessment   Medical Diagnosis  left shoulder RC repair with DCE    Referring Provider (PT)  Dr. Berenice Primas    Next MD Visit  12/29/2018      Observation/Other Assessments   Focus on Therapeutic Outcomes (FOTO)   47% limited      ROM / Strength   AROM / PROM / Strength  AROM;Strength      AROM   Overall AROM Comments  reaching behind back Lt UE to ~ L1     Right/Left Shoulder  Left    Left Shoulder Extension  42 Degrees    Left Shoulder Flexion  130 Degrees    Left Shoulder ABduction  139 Degrees    Left Shoulder External Rotation  70 Degrees      Strength   Strength Assessment Site  Shoulder;Elbow    Right/Left Shoulder  Left    Left Shoulder Flexion  4-/5    Left Shoulder Extension  4+/5    Left Shoulder ABduction  4/5    Left Shoulder Internal  Rotation  5/5    Left Shoulder External Rotation  4-/5    Right/Left Elbow  Left    Left Elbow Flexion  4+/5    Left Elbow Extension  4/5                   [facility] Adult PT Treatment/Exercise - 12/24/18 0001      Shoulder Exercises: Standing   Flexion  Strengthening;Left;20 reps;Theraband   leaning on wall   Theraband Level (Shoulder Flexion)  Level 2 (Red)    Extension  Strengthening;Both;Theraband    Theraband Level (Shoulder Extension)  Level 3 (Green)    Other Standing Exercises  10 reps hand behid head and elbow presses back.       Shoulder Exercises: ROM/Strengthening   Other ROM/Strengthening Exercises  2x10 lat pull down with 25# for strength and stretch at endrange.       Modalities   Modalities  Ultrasound      Cryotherapy   Number Minutes Cryotherapy  10 Minutes    Cryotherapy  Location  Shoulder    Type of Cryotherapy  Ice pack      Ultrasound   Ultrasound Location  Lt bicep tendon proximal    Ultrasound Parameters  100% , 1.63mz, 1.5 w/cm2    Ultrasound Goals  Pain               PT Short Term Goals - 12/24/18 1236      PT SHORT TERM GOAL #1   Title  understand protocol to heal RC    Status  Achieved        PT Long Term Goals - 12/24/18 1236      PT LONG TERM GOAL #1   Title  independent with advanced HEP for upper body strengthening (01/21/2019)    Baseline  given red and green band today.    Time  4    Period  Weeks    Status  Revised    Target Date  01/21/19      PT LONG TERM GOAL #2   Title  increase AROM to 140 degrees flexion  ( 01/21/2019)    Baseline  currently 130    Time  4    Period  Weeks    Status  New    Target Date  01/21/19      PT LONG TERM GOAL #3   Title  increase AROM ER to 60 degrees    Baseline  70    Status  Achieved      PT LONG TERM GOAL #4   Title  report no difficulty with doing hair and dressing  ( 01/21/2019)    Baseline  pt reports 20% improvement, uses compensatory motion to get to top of  head    Time  4    Period  Weeks    Status  New    Target Date  01/21/19      PT LONG TERM GOAL #5   Title  improve Lt shoulder ROM to allow her to reach behind her to her bra strap ( 01/21/2019)    Time  4    Period  Weeks    Status  New    Target Date  01/21/19      Additional Long Term Goals   Additional Long Term Goals  Yes      PT LONG TERM GOAL #6   Title  increase strength  Lt UE to allow her to retrieve her milk from refrig ( 1/142021)    Time  4    Period  Weeks    Status  New    Target Date  01/21/19            Plan - 12/24/18 1322    Clinical Impression Statement  Pt presents for her renewal visit, her Lt shoulder AROM continues to improve.  She has partially met her goals.  Strenght in the Lt UE is grossly 4- to 4/5 limiting her function.  Pain n the anterior shoulder is her biggest complaint at this time.  She would benefit continued therapy to continue to improve ROM, strength and restore PLOF    Rehab Potential  Good    PT Frequency  2x / week    PT Duration  4 weeks    PT Treatment/Interventions  ADLs/Self Care Home Management;Cryotherapy;Electrical Stimulation;Therapeutic activities;Therapeutic exercise;Manual techniques;Vasopneumatic Device;Patient/family education    PT Next Visit Plan  increase strength and ROM, functional exercises,    Consulted and Agree with Plan of Care  Patient  Patient will benefit from skilled therapeutic intervention in order to improve the following deficits and impairments:  Pain, Improper body mechanics, Decreased scar mobility, Increased muscle spasms, Impaired tone, Postural dysfunction, Decreased range of motion, Decreased strength, Impaired UE functional use  Visit Diagnosis: Stiffness of left shoulder, not elsewhere classified - Plan: PT plan of care cert/re-cert  Acute pain of left shoulder - Plan: PT plan of care cert/re-cert  Localized edema - Plan: PT plan of care cert/re-cert  Cramp and spasm - Plan: PT  plan of care cert/re-cert     Problem List Patient Active Problem List   Diagnosis Date Noted  . COPD active smoker/ pfts pending 01/04/2016  . Colitis 08/04/2013  . Abdominal pain 08/04/2013  . Nausea, vomiting and diarrhea 08/04/2013  . Cigarette smoker 08/04/2013  . Dyspnea 08/02/2010    Dawn Lane erPT  12/24/2018, 1:28 PM  [address] [place], [place], [address] Phone: [phone]   Fax:  [phone]  Name: Dawn Lane MRN: [id number] Date of Birth: [birth year]

## 2018-12-28 ENCOUNTER — Encounter: Payer: Self-pay | Admitting: Physical Therapy

## 2018-12-28 ENCOUNTER — Other Ambulatory Visit: Payer: Self-pay

## 2018-12-28 ENCOUNTER — Ambulatory Visit: Payer: Medicare Other | Admitting: Physical Therapy

## 2018-12-28 DIAGNOSIS — M25512 Pain in left shoulder: Secondary | ICD-10-CM | POA: Diagnosis not present

## 2018-12-28 DIAGNOSIS — R252 Cramp and spasm: Secondary | ICD-10-CM

## 2018-12-28 DIAGNOSIS — M25612 Stiffness of left shoulder, not elsewhere classified: Secondary | ICD-10-CM

## 2018-12-28 DIAGNOSIS — R600 Localized edema: Secondary | ICD-10-CM

## 2018-12-28 NOTE — Therapy (Signed)
[place], [place], [address] Phone: [phone]   Fax:  [phone]  Physical Therapy Treatment  Patient Details  Name: Dawn Lane MRN: [id number] Date of Birth: [birth year] Referring Provider (PT): Dr. Berenice Primas   Encounter Date: 12/28/2018  PT End of Session - 12/28/18 1557    Visit Number  17    Date for PT Re-Evaluation  01/21/19    PT Start Time  1515    PT Stop Time  1606    PT Time Calculation (min)  51 min    Activity Tolerance  Patient tolerated treatment well    Behavior During Therapy  [facility] for tasks assessed/performed       Past Medical History:  Diagnosis Date  . Anxiety 2011  . Arthritis   . Asthma   . Emphysema   . Hemorrhoids   . Polysubstance abuse ([place]) 2011   [facility] day [facility] for this. Cocaine, ETOH, MJA,   . Tobacco use     Past Surgical History:  Procedure Laterality Date  . COLONOSCOPY N/A 08/07/2013   Procedure: COLONOSCOPY;  Surgeon: Gatha Mayer, MD;  Location: [place];  Service: Endoscopy;  Laterality: N/A;  . INCISION AND DRAINAGE  12/2009   of thrombosed internal hemorrhoid  . rc repair Left 10/14/2018   by Dr. Berenice Primas  . TUBAL LIGATION      There were no vitals filed for this visit.  Subjective Assessment - 12/28/18 1519    Subjective  "My L arm has been throbbing"    Currently in Pain?  Yes    Pain Score  4     Pain Location  Shoulder    Pain Orientation  Left         [facility] PT Assessment - 12/28/18 0001      AROM   Overall AROM Comments  reaching behind back Lt UE to ~ L1     Right/Left Shoulder  Left    Left Shoulder Extension  42 Degrees    Left Shoulder Flexion  130 Degrees    Left Shoulder ABduction  139 Degrees    Left Shoulder External Rotation  70 Degrees      Strength   Strength Assessment Site  Shoulder    Right/Left Shoulder  Left    Left Shoulder Flexion  4-/5    Left Shoulder Extension  4+/5    Left  Shoulder ABduction  4/5    Left Shoulder Internal Rotation  5/5    Left Shoulder External Rotation  4-/5    Right/Left Elbow  Left    Left Elbow Flexion  4+/5    Left Elbow Extension  4/5                   [facility] Adult PT Treatment/Exercise - 12/28/18 0001      Shoulder Exercises: Standing   Flexion  Strengthening;Left;20 reps;Theraband    Theraband Level (Shoulder Flexion)  Level 2 (Red)    Extension  Strengthening;Both;Theraband    Theraband Level (Shoulder Extension)  Level 3 (Green)    Row  Strengthening;Both;Theraband;20 reps    Theraband Level (Shoulder Row)  Level 3 (Green)    Other Standing Exercises  # l3vel cabinet reaches flex and and x1`0 each       Shoulder Exercises: ROM/Strengthening   UBE (Upper Arm Bike)  L3 x  6 min    Other ROM/Strengthening Exercises  Rows and lats 20lb  2x10       Cryotherapy   Number Minutes Cryotherapy  10 Minutes    Cryotherapy Location  Shoulder    Type of Cryotherapy  Ice pack      Manual Therapy   Manual therapy comments  verb and tactile cuing to relax    Passive ROM  L shoulder all directions within protocol, TP in left upper trap into cervical area               PT Short Term Goals - 12/24/18 1236      PT SHORT TERM GOAL #1   Title  understand protocol to heal RC    Status  Achieved        PT Long Term Goals - 12/24/18 1236      PT LONG TERM GOAL #1   Title  independent with advanced HEP for upper body strengthening (01/21/2019)    Baseline  given red and green band today.    Time  4    Period  Weeks    Status  Revised    Target Date  01/21/19      PT LONG TERM GOAL #2   Title  increase AROM to 140 degrees flexion  ( 01/21/2019)    Baseline  currently 130    Time  4    Period  Weeks    Status  New    Target Date  01/21/19      PT LONG TERM GOAL #3   Title  increase AROM ER to 60 degrees    Baseline  70    Status  Achieved      PT LONG TERM GOAL #4   Title  report no difficulty with doing  hair and dressing  ( 01/21/2019)    Baseline  pt reports 20% improvement, uses compensatory motion to get to top of head    Time  4    Period  Weeks    Status  New    Target Date  01/21/19      PT LONG TERM GOAL #5   Title  improve Lt shoulder ROM to allow her to reach behind her to her bra strap ( 01/21/2019)    Time  4    Period  Weeks    Status  New    Target Date  01/21/19      Additional Long Term Goals   Additional Long Term Goals  Yes      PT LONG TERM GOAL #6   Title  increase strength  Lt UE to allow her to retrieve her milk from refrig ( 1/142021)    Time  4    Period  Weeks    Status  New    Target Date  01/21/19            Plan - 12/28/18 1557    Clinical Impression Statement  Pt continues to do well, LUE weakness remains. Postural cues needed with all rowing interventions. Some L shoulder elevation noted with flexion and abduction. Cues needed to relax with MT, she is very limited with passive R shoulder internal rotation.    Stability/Clinical Decision Making  Evolving/Moderate complexity    Rehab Potential  Good    PT Frequency  2x / week    PT Duration  4 weeks    PT Treatment/Interventions  ADLs/Self Care Home Management;Cryotherapy;Electrical Stimulation;Therapeutic activities;Therapeutic exercise;Manual techniques;Vasopneumatic Device;Patient/family education    PT Next Visit Plan  increase strength and ROM, functional exercises,  Patient will benefit from skilled therapeutic intervention in order to improve the following deficits and impairments:  Pain, Improper body mechanics, Decreased scar mobility, Increased muscle spasms, Impaired tone, Postural dysfunction, Decreased range of motion, Decreased strength, Impaired UE functional use  Visit Diagnosis: Acute pain of left shoulder  Stiffness of left shoulder, not elsewhere classified  Localized edema  Cramp and spasm     Problem List Patient Active Problem List   Diagnosis Date Noted   . COPD active smoker/ pfts pending 01/04/2016  . Colitis 08/04/2013  . Abdominal pain 08/04/2013  . Nausea, vomiting and diarrhea 08/04/2013  . Cigarette smoker 08/04/2013  . Dyspnea 08/02/2010    Grayce Sessions, PTA 12/28/2018, 3:59 PM  [facility] [place] [address] [place], [place], [address] Phone: [phone]   Fax:  [phone]  Name: SIERA KERSCHNER MRN: [id number] Date of Birth: 10-03-1950

## 2018-12-30 ENCOUNTER — Other Ambulatory Visit: Payer: Self-pay

## 2018-12-30 ENCOUNTER — Encounter: Payer: Self-pay | Admitting: Physical Therapy

## 2018-12-30 ENCOUNTER — Ambulatory Visit: Payer: Medicare Other | Admitting: Physical Therapy

## 2018-12-30 DIAGNOSIS — R600 Localized edema: Secondary | ICD-10-CM

## 2018-12-30 DIAGNOSIS — R252 Cramp and spasm: Secondary | ICD-10-CM | POA: Diagnosis not present

## 2018-12-30 DIAGNOSIS — M25512 Pain in left shoulder: Secondary | ICD-10-CM

## 2018-12-30 DIAGNOSIS — M25612 Stiffness of left shoulder, not elsewhere classified: Secondary | ICD-10-CM
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
## 2019-01-02 ENCOUNTER — Encounter (HOSPITAL_COMMUNITY): Payer: Self-pay

## 2019-01-02 ENCOUNTER — Emergency Department (HOSPITAL_COMMUNITY)
Admission: EM | Admit: 2019-01-02 | Discharge: 2019-01-02 | Disposition: A | Discharge status: Home / Self Care | Payer: Medicare Other | Attending: Emergency Medicine | Admitting: Emergency Medicine

## 2019-01-02 DIAGNOSIS — Y998 Other external cause status: Secondary | ICD-10-CM | POA: Diagnosis not present

## 2019-01-02 DIAGNOSIS — S81852A Open bite, left lower leg, initial encounter: Secondary | ICD-10-CM | POA: Diagnosis not present

## 2019-01-02 DIAGNOSIS — M85841 Other specified disorders of bone density and structure, right hand: Secondary | ICD-10-CM | POA: Diagnosis not present

## 2019-01-02 DIAGNOSIS — S66821A Laceration of other specified muscles, fascia and tendons at wrist and hand level, right hand, initial encounter: Secondary | ICD-10-CM | POA: Diagnosis not present

## 2019-01-02 DIAGNOSIS — R0902 Hypoxemia: Secondary | ICD-10-CM | POA: Diagnosis not present

## 2019-01-02 DIAGNOSIS — Z743 Need for continuous supervision: Secondary | ICD-10-CM | POA: Diagnosis not present

## 2019-01-02 DIAGNOSIS — Z5321 Procedure and treatment not carried out due to patient leaving prior to being seen by health care provider: Secondary | ICD-10-CM | POA: Insufficient documentation

## 2019-01-02 DIAGNOSIS — W540XXA Bitten by dog, initial encounter: Secondary | ICD-10-CM | POA: Insufficient documentation

## 2019-01-02 DIAGNOSIS — Y9389 Activity, other specified: Secondary | ICD-10-CM | POA: Diagnosis not present

## 2019-01-02 DIAGNOSIS — S6991XA Unspecified injury of right wrist, hand and finger(s), initial encounter: Secondary | ICD-10-CM | POA: Diagnosis present

## 2019-01-02 DIAGNOSIS — S71112A Laceration without foreign body, left thigh, initial encounter: Secondary | ICD-10-CM | POA: Diagnosis not present

## 2019-01-02 DIAGNOSIS — W5581XA Bitten by other mammals, initial encounter: Secondary | ICD-10-CM | POA: Diagnosis not present

## 2019-01-02 DIAGNOSIS — Z23 Encounter for immunization: Secondary | ICD-10-CM | POA: Diagnosis not present

## 2019-01-02 DIAGNOSIS — S61451A Open bite of right hand, initial encounter: Secondary | ICD-10-CM | POA: Insufficient documentation

## 2019-01-02 DIAGNOSIS — R936 Abnormal findings on diagnostic imaging of limbs: Secondary | ICD-10-CM | POA: Diagnosis not present

## 2019-01-02 DIAGNOSIS — S52501A Unspecified fracture of the lower end of right radius, initial encounter for closed fracture: Secondary | ICD-10-CM | POA: Diagnosis not present

## 2019-01-02 DIAGNOSIS — Y999 Unspecified external cause status: Secondary | ICD-10-CM | POA: Diagnosis not present

## 2019-01-02 DIAGNOSIS — S71151A Open bite, right thigh, initial encounter: Secondary | ICD-10-CM | POA: Diagnosis not present

## 2019-01-02 DIAGNOSIS — Y92018 Other place in single-family (private) house as the place of occurrence of the external cause: Secondary | ICD-10-CM | POA: Diagnosis not present

## 2019-01-02 DIAGNOSIS — R52 Pain, unspecified: Secondary | ICD-10-CM | POA: Diagnosis not present

## 2019-01-02 DIAGNOSIS — S61411A Laceration without foreign body of right hand, initial encounter: Secondary | ICD-10-CM | POA: Diagnosis not present

## 2019-01-02 DIAGNOSIS — M19041 Primary osteoarthritis, right hand: Secondary | ICD-10-CM | POA: Diagnosis not present

## 2019-01-02 NOTE — ED Notes (Signed)
Since being triaged, pt has continued to holler in the lobby anytime a new staff member comes out. Multiple staff members have requested pt to keep her voice down, but pt continues to carry on.

## 2019-01-02 NOTE — ED Triage Notes (Signed)
Pt BIBA from home. Pt's own dog bit her right hand with 3-4 puncture wounds. Pt has one puncture on left leg near knee. Pt was bit by 70lb pitbull. Dog is UTD on shots.  146/100 106 20 RR 94%  97.9

## 2019-01-04 ENCOUNTER — Ambulatory Visit: Payer: Medicare Other | Admitting: Physical Therapy

## 2019-01-04 DIAGNOSIS — M159 Polyosteoarthritis, unspecified: Secondary | ICD-10-CM | POA: Diagnosis not present

## 2019-01-04 DIAGNOSIS — R9431 Abnormal electrocardiogram [ECG] [EKG]: Secondary | ICD-10-CM | POA: Diagnosis not present

## 2019-01-04 DIAGNOSIS — R197 Diarrhea, unspecified: Secondary | ICD-10-CM | POA: Diagnosis not present

## 2019-01-04 DIAGNOSIS — N3289 Other specified disorders of bladder: Secondary | ICD-10-CM | POA: Diagnosis not present

## 2019-01-04 DIAGNOSIS — R933 Abnormal findings on diagnostic imaging of other parts of digestive tract: Secondary | ICD-10-CM | POA: Diagnosis not present

## 2019-01-04 DIAGNOSIS — R1084 Generalized abdominal pain: Secondary | ICD-10-CM | POA: Diagnosis not present

## 2019-01-04 DIAGNOSIS — I444 Left anterior fascicular block: Secondary | ICD-10-CM | POA: Diagnosis not present

## 2019-01-04 DIAGNOSIS — R112 Nausea with vomiting, unspecified: Secondary | ICD-10-CM | POA: Diagnosis not present

## 2019-01-04 DIAGNOSIS — N281 Cyst of kidney, acquired: Secondary | ICD-10-CM | POA: Diagnosis not present

## 2019-01-04 DIAGNOSIS — I517 Cardiomegaly: Secondary | ICD-10-CM | POA: Diagnosis not present

## 2019-01-04 DIAGNOSIS — I700 Atherosclerosis of aorta: Secondary | ICD-10-CM | POA: Diagnosis not present

## 2019-01-05 DIAGNOSIS — R112 Nausea with vomiting, unspecified: Secondary | ICD-10-CM | POA: Diagnosis not present

## 2019-01-05 DIAGNOSIS — I517 Cardiomegaly: Secondary | ICD-10-CM | POA: Diagnosis not present

## 2019-01-05 DIAGNOSIS — I444 Left anterior fascicular block: Secondary | ICD-10-CM | POA: Diagnosis not present

## 2019-01-05 DIAGNOSIS — R933 Abnormal findings on diagnostic imaging of other parts of digestive tract: Secondary | ICD-10-CM | POA: Diagnosis not present

## 2019-01-05 DIAGNOSIS — R9431 Abnormal electrocardiogram [ECG] [EKG]: Secondary | ICD-10-CM | POA: Diagnosis not present

## 2019-01-05 DIAGNOSIS — N3289 Other specified disorders of bladder: Secondary | ICD-10-CM | POA: Diagnosis not present

## 2019-01-06 DIAGNOSIS — M545 Low back pain: Secondary | ICD-10-CM | POA: Diagnosis not present

## 2019-01-06 DIAGNOSIS — M62830 Muscle spasm of back: Secondary | ICD-10-CM | POA: Diagnosis not present

## 2019-01-06 DIAGNOSIS — M25511 Pain in right shoulder: Secondary | ICD-10-CM | POA: Diagnosis not present

## 2019-01-06 DIAGNOSIS — R110 Nausea: Secondary | ICD-10-CM | POA: Diagnosis not present

## 2019-01-06 DIAGNOSIS — R197 Diarrhea, unspecified: Secondary | ICD-10-CM | POA: Diagnosis not present

## 2019-01-06 DIAGNOSIS — Z79899 Other long term (current) drug therapy: Secondary | ICD-10-CM | POA: Diagnosis not present

## 2019-01-06 DIAGNOSIS — M25512 Pain in left shoulder: Secondary | ICD-10-CM | POA: Diagnosis not present

## 2019-01-06 DIAGNOSIS — R8290 Unspecified abnormal findings in urine: Secondary | ICD-10-CM | POA: Diagnosis not present

## 2019-01-06 DIAGNOSIS — R1084 Generalized abdominal pain: Secondary | ICD-10-CM | POA: Diagnosis not present

## 2019-01-12 ENCOUNTER — Ambulatory Visit: Payer: Medicare Other | Attending: Orthopedic Surgery | Admitting: Physical Therapy

## 2019-01-14 ENCOUNTER — Ambulatory Visit: Payer: Medicare Other | Admitting: Physical Therapy

## 2019-01-29 DIAGNOSIS — S61551S Open bite of right wrist, sequela: Secondary | ICD-10-CM | POA: Diagnosis not present

## 2019-01-29 DIAGNOSIS — M25531 Pain in right wrist: Secondary | ICD-10-CM | POA: Diagnosis not present

## 2019-02-02 DIAGNOSIS — M1811 Unilateral primary osteoarthritis of first carpometacarpal joint, right hand: Secondary | ICD-10-CM | POA: Diagnosis not present

## 2019-02-02 DIAGNOSIS — M65311 Trigger thumb, right thumb: Secondary | ICD-10-CM | POA: Diagnosis not present

## 2019-02-04 DIAGNOSIS — M545 Low back pain: Secondary | ICD-10-CM | POA: Diagnosis not present

## 2019-02-04 DIAGNOSIS — M62830 Muscle spasm of back: Secondary | ICD-10-CM | POA: Diagnosis not present

## 2019-02-04 DIAGNOSIS — M25512 Pain in left shoulder: Secondary | ICD-10-CM | POA: Diagnosis not present

## 2019-02-04 DIAGNOSIS — M25511 Pain in right shoulder: Secondary | ICD-10-CM | POA: Diagnosis not present

## 2019-02-04 DIAGNOSIS — Z79899 Other long term (current) drug therapy: Secondary | ICD-10-CM | POA: Diagnosis not present

## 2019-02-04 DIAGNOSIS — F17210 Nicotine dependence, cigarettes, uncomplicated: Secondary | ICD-10-CM | POA: Diagnosis not present

## 2019-02-23 DIAGNOSIS — J441 Chronic obstructive pulmonary disease with (acute) exacerbation: Secondary | ICD-10-CM | POA: Diagnosis not present

## 2019-02-23 DIAGNOSIS — J449 Chronic obstructive pulmonary disease, unspecified: Secondary | ICD-10-CM | POA: Diagnosis not present

## 2019-03-08 DIAGNOSIS — J441 Chronic obstructive pulmonary disease with (acute) exacerbation: Secondary | ICD-10-CM | POA: Diagnosis not present

## 2019-03-08 DIAGNOSIS — J449 Chronic obstructive pulmonary disease, unspecified: Secondary | ICD-10-CM | POA: Diagnosis not present

## 2019-03-09 DIAGNOSIS — F17210 Nicotine dependence, cigarettes, uncomplicated: Secondary | ICD-10-CM | POA: Diagnosis not present

## 2019-03-09 DIAGNOSIS — M545 Low back pain: Secondary | ICD-10-CM | POA: Diagnosis not present

## 2019-03-09 DIAGNOSIS — M62830 Muscle spasm of back: Secondary | ICD-10-CM | POA: Diagnosis not present

## 2019-03-09 DIAGNOSIS — M25511 Pain in right shoulder: Secondary | ICD-10-CM | POA: Diagnosis not present

## 2019-03-09 DIAGNOSIS — Z79899 Other long term (current) drug therapy: Secondary | ICD-10-CM | POA: Diagnosis not present

## 2019-03-09 DIAGNOSIS — M25512 Pain in left shoulder: Secondary | ICD-10-CM | POA: Diagnosis not present

## 2019-03-23 DIAGNOSIS — Z79899 Other long term (current) drug therapy: Secondary | ICD-10-CM | POA: Diagnosis not present

## 2019-03-23 DIAGNOSIS — M25511 Pain in right shoulder: Secondary | ICD-10-CM | POA: Diagnosis not present

## 2019-03-23 DIAGNOSIS — M25552 Pain in left hip: Secondary | ICD-10-CM | POA: Diagnosis not present

## 2019-03-23 DIAGNOSIS — F17210 Nicotine dependence, cigarettes, uncomplicated: Secondary | ICD-10-CM | POA: Diagnosis not present

## 2019-03-23 DIAGNOSIS — M545 Low back pain: Secondary | ICD-10-CM | POA: Diagnosis not present

## 2019-03-23 DIAGNOSIS — M25512 Pain in left shoulder: Secondary | ICD-10-CM | POA: Diagnosis not present

## 2019-04-06 DIAGNOSIS — M25512 Pain in left shoulder: Secondary | ICD-10-CM | POA: Diagnosis not present

## 2019-04-06 DIAGNOSIS — M25511 Pain in right shoulder: Secondary | ICD-10-CM | POA: Diagnosis not present

## 2019-04-06 DIAGNOSIS — M545 Low back pain: Secondary | ICD-10-CM | POA: Diagnosis not present

## 2019-04-06 DIAGNOSIS — Z79899 Other long term (current) drug therapy: Secondary | ICD-10-CM | POA: Diagnosis not present

## 2019-04-06 DIAGNOSIS — M25552 Pain in left hip: Secondary | ICD-10-CM | POA: Diagnosis not present

## 2019-04-06 DIAGNOSIS — F17210 Nicotine dependence, cigarettes, uncomplicated: Secondary | ICD-10-CM | POA: Diagnosis not present

## 2019-04-09 DIAGNOSIS — J449 Chronic obstructive pulmonary disease, unspecified: Secondary | ICD-10-CM | POA: Diagnosis not present

## 2019-04-09 DIAGNOSIS — G894 Chronic pain syndrome: Secondary | ICD-10-CM | POA: Diagnosis not present

## 2019-04-21 DIAGNOSIS — M545 Low back pain: Secondary | ICD-10-CM | POA: Diagnosis not present

## 2019-04-21 DIAGNOSIS — G4710 Hypersomnia, unspecified: Secondary | ICD-10-CM | POA: Diagnosis not present

## 2019-04-21 DIAGNOSIS — F17210 Nicotine dependence, cigarettes, uncomplicated: Secondary | ICD-10-CM | POA: Diagnosis not present

## 2019-04-21 DIAGNOSIS — J439 Emphysema, unspecified: Secondary | ICD-10-CM | POA: Diagnosis not present

## 2019-05-04 DIAGNOSIS — F17210 Nicotine dependence, cigarettes, uncomplicated: Secondary | ICD-10-CM | POA: Diagnosis not present

## 2019-05-04 DIAGNOSIS — M545 Low back pain: Secondary | ICD-10-CM | POA: Diagnosis not present

## 2019-05-04 DIAGNOSIS — M25512 Pain in left shoulder: Secondary | ICD-10-CM | POA: Diagnosis not present

## 2019-05-04 DIAGNOSIS — M25511 Pain in right shoulder: Secondary | ICD-10-CM | POA: Diagnosis not present

## 2019-05-04 DIAGNOSIS — Z79899 Other long term (current) drug therapy: Secondary | ICD-10-CM | POA: Diagnosis not present

## 2019-05-04 DIAGNOSIS — M25552 Pain in left hip: Secondary | ICD-10-CM | POA: Diagnosis not present

## 2019-05-10 DIAGNOSIS — G894 Chronic pain syndrome: Secondary | ICD-10-CM | POA: Diagnosis not present

## 2019-05-10 DIAGNOSIS — J449 Chronic obstructive pulmonary disease, unspecified: Secondary | ICD-10-CM | POA: Diagnosis not present

## 2019-05-31 DIAGNOSIS — R0683 Snoring: Secondary | ICD-10-CM | POA: Diagnosis not present

## 2019-06-02 DIAGNOSIS — Z79899 Other long term (current) drug therapy: Secondary | ICD-10-CM | POA: Diagnosis not present

## 2019-06-02 DIAGNOSIS — M25511 Pain in right shoulder: Secondary | ICD-10-CM | POA: Diagnosis not present

## 2019-06-02 DIAGNOSIS — M25512 Pain in left shoulder: Secondary | ICD-10-CM | POA: Diagnosis not present

## 2019-06-02 DIAGNOSIS — M25552 Pain in left hip: Secondary | ICD-10-CM | POA: Diagnosis not present

## 2019-06-02 DIAGNOSIS — F17210 Nicotine dependence, cigarettes, uncomplicated: Secondary | ICD-10-CM | POA: Diagnosis not present

## 2019-06-02 DIAGNOSIS — M545 Low back pain: Secondary | ICD-10-CM | POA: Diagnosis not present

## 2019-06-08 DIAGNOSIS — G894 Chronic pain syndrome: Secondary | ICD-10-CM | POA: Diagnosis not present

## 2019-06-08 DIAGNOSIS — J449 Chronic obstructive pulmonary disease, unspecified: Secondary | ICD-10-CM | POA: Diagnosis not present

## 2019-06-24 DIAGNOSIS — M545 Low back pain: Secondary | ICD-10-CM | POA: Diagnosis not present

## 2019-06-24 DIAGNOSIS — M25512 Pain in left shoulder: Secondary | ICD-10-CM | POA: Diagnosis not present

## 2019-06-24 DIAGNOSIS — M25552 Pain in left hip: Secondary | ICD-10-CM | POA: Diagnosis not present

## 2019-06-24 DIAGNOSIS — M25511 Pain in right shoulder: Secondary | ICD-10-CM | POA: Diagnosis not present

## 2019-06-24 DIAGNOSIS — Z79899 Other long term (current) drug therapy: Secondary | ICD-10-CM | POA: Diagnosis not present

## 2019-06-24 DIAGNOSIS — F17210 Nicotine dependence, cigarettes, uncomplicated: Secondary | ICD-10-CM | POA: Diagnosis not present

## 2019-07-08 DIAGNOSIS — J449 Chronic obstructive pulmonary disease, unspecified: Secondary | ICD-10-CM | POA: Diagnosis not present

## 2019-07-08 DIAGNOSIS — G894 Chronic pain syndrome: Secondary | ICD-10-CM | POA: Diagnosis not present

## 2019-07-12 ENCOUNTER — Emergency Department (HOSPITAL_COMMUNITY)
Admission: EM | Admit: 2019-07-12 | Discharge: 2019-07-13 | Disposition: A | Discharge status: Home / Self Care | Payer: Medicare Other | Attending: Emergency Medicine | Admitting: Emergency Medicine

## 2019-07-12 ENCOUNTER — Encounter (HOSPITAL_COMMUNITY): Payer: Self-pay | Admitting: Emergency Medicine

## 2019-07-12 DIAGNOSIS — F4324 Adjustment disorder with disturbance of conduct: Secondary | ICD-10-CM | POA: Insufficient documentation

## 2019-07-12 DIAGNOSIS — Z20822 Contact with and (suspected) exposure to covid-19: Secondary | ICD-10-CM | POA: Diagnosis not present

## 2019-07-12 DIAGNOSIS — R451 Restlessness and agitation: Secondary | ICD-10-CM | POA: Insufficient documentation

## 2019-07-12 DIAGNOSIS — I499 Cardiac arrhythmia, unspecified: Secondary | ICD-10-CM | POA: Diagnosis not present

## 2019-07-12 DIAGNOSIS — F17210 Nicotine dependence, cigarettes, uncomplicated: Secondary | ICD-10-CM | POA: Insufficient documentation

## 2019-07-12 DIAGNOSIS — R6889 Other general symptoms and signs: Secondary | ICD-10-CM | POA: Diagnosis not present

## 2019-07-12 DIAGNOSIS — J449 Chronic obstructive pulmonary disease, unspecified: Secondary | ICD-10-CM | POA: Diagnosis not present

## 2019-07-12 DIAGNOSIS — R45850 Homicidal ideations: Secondary | ICD-10-CM | POA: Insufficient documentation

## 2019-07-12 DIAGNOSIS — R4689 Other symptoms and signs involving appearance and behavior: Secondary | ICD-10-CM | POA: Diagnosis present

## 2019-07-12 DIAGNOSIS — R61 Generalized hyperhidrosis: Secondary | ICD-10-CM | POA: Diagnosis not present

## 2019-07-12 DIAGNOSIS — Z743 Need for continuous supervision: Secondary | ICD-10-CM | POA: Diagnosis not present

## 2019-07-12 LAB — COMPREHENSIVE METABOLIC PANEL
ALT: 25 U/L (ref 0–44)
AST: 32 U/L (ref 15–41)
Albumin: 4.8 g/dL (ref 3.5–5.0)
Alkaline Phosphatase: 60 U/L (ref 38–126)
Anion gap: 14 (ref 5–15)
BUN: 13 mg/dL (ref 8–23)
CO2: 25 mmol/L (ref 22–32)
Calcium: 10.1 mg/dL (ref 8.9–10.3)
Chloride: 104 mmol/L (ref 98–111)
Creatinine, Ser: 0.96 mg/dL (ref 0.44–1.00)
GFR calc Af Amer: >60 mL/min (ref >60)
GFR calc non Af Amer: >60 mL/min (ref >60)
Glucose, Bld: 124 mg/dL — ABNORMAL HIGH (ref 70–99)
Potassium: 4.3 mmol/L (ref 3.5–5.1)
Sodium: 143 mmol/L (ref 135–145)
Total Bilirubin: 1.1 mg/dL (ref 0.3–1.2)
Total Protein: 8.1 g/dL (ref 6.5–8.1)

## 2019-07-12 LAB — CBC
HCT: 52.4 % — ABNORMAL HIGH (ref 36.0–46.0)
Hemoglobin: 16.7 g/dL — ABNORMAL HIGH (ref 12.0–15.0)
MCH: 28.1 pg (ref 26.0–34.0)
MCHC: 31.9 g/dL (ref 30.0–36.0)
MCV: 88.2 fL (ref 80.0–100.0)
Platelets: 256 10*3/uL (ref 150–400)
RBC: 5.94 MIL/uL — ABNORMAL HIGH (ref 3.87–5.11)
RDW: 13.1 % (ref 11.5–15.5)
WBC: 4.2 10*3/uL (ref 4.0–10.5)
nRBC: 0 % (ref 0.0–0.2)

## 2019-07-12 LAB — RAPID URINE DRUG SCREEN, HOSP PERFORMED
Amphetamines: NOT DETECTED
Barbiturates: NOT DETECTED
Benzodiazepines: NOT DETECTED
Cocaine: POSITIVE — AB
Opiates: NOT DETECTED
Tetrahydrocannabinol: NOT DETECTED

## 2019-07-12 LAB — SALICYLATE LEVEL: Salicylate Lvl: <7 mg/dL — ABNORMAL LOW (ref 7.0–30.0)

## 2019-07-12 LAB — ACETAMINOPHEN LEVEL: Acetaminophen (Tylenol), Serum: <10 ug/mL — ABNORMAL LOW (ref 10–30)

## 2019-07-12 LAB — SARS CORONAVIRUS 2 BY RT PCR (HOSPITAL ORDER, PERFORMED IN ~~LOC~~ HOSPITAL LAB): SARS Coronavirus 2: NEGATIVE

## 2019-07-12 LAB — ETHANOL: Alcohol, Ethyl (B): <10 mg/dL

## 2019-07-12 MED ORDER — ZIPRASIDONE MESYLATE 20 MG IM SOLR
20.0000 mg | Freq: Once | INTRAMUSCULAR | Status: AC
  Administered 2019-07-12: 20 mg via INTRAMUSCULAR
  Filled 2019-07-12: qty 20

## 2019-07-12 MED ORDER — ALBUTEROL SULFATE HFA 108 (90 BASE) MCG/ACT IN AERS
2.0000 | INHALATION_SPRAY | Freq: Four times a day (QID) | RESPIRATORY_TRACT | Status: DC | PRN
  Administered 2019-07-13: 2 via RESPIRATORY_TRACT
  Filled 2019-07-12: qty 6.7

## 2019-07-12 MED ORDER — DIPHENHYDRAMINE HCL 50 MG/ML IJ SOLN
50.0000 mg | Freq: Once | INTRAMUSCULAR | Status: DC
  Filled 2019-07-12: qty 1

## 2019-07-12 MED ORDER — ONDANSETRON HCL 4 MG PO TABS: 4.0000 mg | ORAL_TABLET | Freq: Three times a day (TID) | ORAL | Status: DC | PRN

## 2019-07-12 MED ORDER — LORAZEPAM 2 MG/ML IJ SOLN
1.0000 mg | Freq: Once | INTRAMUSCULAR | Status: DC
  Filled 2019-07-12: qty 1

## 2019-07-12 MED ORDER — STERILE WATER FOR INJECTION IJ SOLN
INTRAMUSCULAR | Status: AC
  Administered 2019-07-12: 10 mL
  Filled 2019-07-12: qty 10

## 2019-07-12 MED ORDER — NICOTINE 21 MG/24HR TD PT24
21.0000 mg | MEDICATED_PATCH | Freq: Every day | TRANSDERMAL | Status: DC
  Filled 2019-07-12 (×2): qty 1

## 2019-07-12 MED ORDER — GABAPENTIN 300 MG PO CAPS
300.0000 mg | ORAL_CAPSULE | Freq: Three times a day (TID) | ORAL | Status: DC
  Administered 2019-07-12: 300 mg via ORAL
  Filled 2019-07-12: qty 1

## 2019-07-12 NOTE — ED Notes (Signed)
Pt to room 29. Pt calm, cooperative, drowsy. Pt oriented to unit. Pt sleeping.
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
## 2019-07-12 NOTE — BH Assessment (Signed)
Per Kirstin, RN, pt was medicated, asleep and unable to participate in assessment.

## 2019-07-12 NOTE — ED Notes (Signed)
Pt has been seen and wanded by security.  Pt has belongings at desk in triage.

## 2019-07-13 DIAGNOSIS — F4324 Adjustment disorder with disturbance of conduct: Secondary | ICD-10-CM | POA: Diagnosis not present

## 2019-07-13 MED ORDER — BUPRENORPHINE HCL 8 MG SL SUBL
8.0000 mg | SUBLINGUAL_TABLET | Freq: Two times a day (BID) | SUBLINGUAL | Status: DC
  Filled 2019-07-13: qty 1

## 2019-07-13 MED ORDER — ACETAMINOPHEN 500 MG PO TABS: 500.0000 mg | ORAL_TABLET | Freq: Four times a day (QID) | ORAL | Status: DC | PRN

## 2019-07-13 MED ORDER — GABAPENTIN 400 MG PO CAPS
800.0000 mg | ORAL_CAPSULE | Freq: Four times a day (QID) | ORAL | Status: DC
  Administered 2019-07-13: 800 mg via ORAL
  Filled 2019-07-13: qty 2

## 2019-07-13 MED ORDER — ETODOLAC 200 MG PO CAPS
400.0000 mg | ORAL_CAPSULE | Freq: Every day | ORAL | Status: DC | PRN
  Filled 2019-07-13: qty 2

## 2019-07-13 MED ORDER — TIZANIDINE HCL 4 MG PO TABS: 4.0000 mg | ORAL_TABLET | Freq: Three times a day (TID) | ORAL | Status: DC | PRN

## 2019-07-13 MED ORDER — OXYCODONE HCL 5 MG PO TABS
15.0000 mg | ORAL_TABLET | Freq: Four times a day (QID) | ORAL | Status: DC | PRN
  Administered 2019-07-13: 15 mg via ORAL
  Filled 2019-07-13: qty 3

## 2019-07-13 MED ORDER — MELOXICAM 15 MG PO TABS
15.0000 mg | ORAL_TABLET | Freq: Two times a day (BID) | ORAL | Status: DC
  Administered 2019-07-13 (×2): 15 mg via ORAL
  Filled 2019-07-13 (×2): qty 1

## 2019-07-13 MED ORDER — CALCIUM POLYCARBOPHIL 625 MG PO TABS
625.0000 mg | ORAL_TABLET | Freq: Every day | ORAL | Status: DC
  Administered 2019-07-13: 625 mg via ORAL
  Filled 2019-07-13: qty 1

## 2019-07-13 MED ORDER — BUPRENORPHINE HCL 900 MCG BU FILM: 1.0000 | ORAL_FILM | Freq: Two times a day (BID) | BUCCAL | Status: DC

## 2019-07-13 MED ORDER — OXYCODONE HCL 5 MG PO TABS: 15.0000 mg | ORAL_TABLET | Freq: Three times a day (TID) | ORAL | Status: DC | PRN

## 2019-07-13 MED ORDER — DICLOFENAC SODIUM 1 % EX GEL: 2.0000 g | Freq: Four times a day (QID) | CUTANEOUS | Status: DC | PRN

## 2019-07-13 MED ORDER — OXYCODONE HCL 5 MG PO TABS
15.0000 mg | ORAL_TABLET | Freq: Once | ORAL | Status: AC
  Administered 2019-07-13: 15 mg via ORAL
  Filled 2019-07-13: qty 3

## 2019-07-13 NOTE — ED Notes (Signed)
Patient c/o of severe pain even after 300mg  PO gabapentin and PO mobic. Patient states that she normally takes 800mg  PO gabapentin per day. Notified Dr. . Will continue to monitor.

## 2019-07-13 NOTE — BHH Counselor (Signed)
Patient seen by TTS. Disposition pending collateral.

## 2019-07-13 NOTE — ED Notes (Signed)
Pt DCd off unit to home per provider. Pt alert, cooperative, no s/s of distress. DC information and resources given to pt, acknowledged understanding. Belongings given to pt. Pt ambulatory off unit, escorted by [facility]. Pt transported by [employer]

## 2019-07-13 NOTE — BH Assessment (Signed)
At 0457, per Kirstin, RN pt is awake and able to be assessed. Clinician expressed to RN the pt to be seen during day shift.   Redmond Pulling, MS, [facility], [facility] Triage Specialist [phone]

## 2019-07-13 NOTE — BH Assessment (Signed)
[facility] Assessment Progress Note  Per Caryn Bee, DNP, this pt does not require psychiatric hospitalization at this time.  Pt presents under IVC initiated by law enforcement and initially upheld by EDP Arby Barrette, MD, but now rescinded by Nelly Rout, MD.  Pt is to be discharged from [facility] with referral information for area substance abuse treatment providers.  This has been included in pt's discharge instructions.  Pt would also benefit from seeing Peer Support Specialists, and a peer support consult has been ordered for pt.  Pt's nurse, Waynetta Sandy, has been notified.  Doylene Canning, MA Triage Specialist [phone]

## 2019-07-13 NOTE — Discharge Instructions (Signed)
To help you maintain a sober lifestyle, a substance abuse treatment program may be beneficial to you.  Contact one of the following facilities at your earliest opportunity to ask about enrolling:  RESIDENTIAL PROGRAMS:       [facility]      [address]      [place], [place] [address]      [phone]       [facility]      [address], [place] [address]      [phone]  CHEMICAL DEPENDENCY INTENSIVE OUTPATIENT PROGRAMS:       [facility]      [address] [employer] [address]      [place], [place] [address]      [phone]      Contact person: Harrington Challenger, LCSW       [facility]      [address] [place], [place] [address]      [phone]
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
## 2019-07-13 NOTE — Patient Outreach (Signed)
ED Peer Support Specialist Patient Intake (Complete at intake & 30-60 Day Follow-up)  Name: Dawn Lane  MRN: [id number]  Age: 69 y.o.   Date of Admission: 07/13/2019  Intake: Initial Comments:      Primary Reason Admitted: Homicidal ideation Aggressive behavior   Lab values: Alcohol/ETOH: Negative Positive UDS? Yes Amphetamines: No Barbiturates: No Benzodiazepines: No Cocaine: Yes Opiates: No Cannabinoids: No  Demographic information: Gender: Female Ethnicity: African American Marital Status: Single Insurance Status: Diplomatic Services operational officer (Work Neurosurgeon, Physicist, medical, etc.:   Lives with:   Living situation: House/Apartment  Reported Patient History: Patient reported health conditions: Anxiety disorders Patient aware of HIV and hepatitis status: No  In past year, has patient visited ED for any reason? Yes  Number of ED visits: 5  Reason(s) for visit: various reasons  In past year, has patient been hospitalized for any reason? No  Number of hospitalizations:    Reason(s) for hospitalization:    In past year, has patient been arrested? Yes  Number of arrests: 1  Reason(s) for arrest: verbal aggressive  In past year, has patient been incarcerated? No  Number of incarcerations:    Reason(s) for incarceration:    In past year, has patient received medication-assisted treatment? No  In past year, patient received the following treatments:    In past year, has patient received any harm reduction services? No  Did this include any of the following?    In past year, has patient received care from a mental health provider for diagnosis other than SUD? No  In past year, is this first time patient has overdosed? No  Number of past overdoses:    In past year, is this first time patient has been hospitalized for an overdose? No  Number of hospitalizations for overdose(s):    Is patient currently receiving treatment  for a mental health diagnosis? No  Patient reports experiencing difficulty participating in SUD treatment: Yes    Most important reason(s) for this difficulty? Treatment access  Has patient received prior services for treatment? No  In past, patient has received services from following agencies:    Plan of Care:  Suggested follow up at these agencies/treatment centers: Other (comment)  Other information: [facility] met with Pt an was wanting to seek help foe substance abuse an is wanting information for outpatient services. [facility] processed processed with Pt about a few facilities an also left contact information for some day programs that Pt may benefit from utiliizing there services. [facility] left contact information for Pt to follow up with [facility] in community.     Aaron Edelman Revella Shelton, [place]  07/13/2019 12:48 PM
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
## 2019-07-28 DIAGNOSIS — F17210 Nicotine dependence, cigarettes, uncomplicated: Secondary | ICD-10-CM | POA: Diagnosis not present

## 2019-07-28 DIAGNOSIS — M545 Low back pain: Secondary | ICD-10-CM | POA: Diagnosis not present

## 2019-07-28 DIAGNOSIS — Z9181 History of falling: Secondary | ICD-10-CM | POA: Diagnosis not present

## 2019-07-28 DIAGNOSIS — M25552 Pain in left hip: Secondary | ICD-10-CM | POA: Diagnosis not present

## 2019-07-28 DIAGNOSIS — Z79899 Other long term (current) drug therapy: Secondary | ICD-10-CM | POA: Diagnosis not present

## 2019-07-28 DIAGNOSIS — M25512 Pain in left shoulder: Secondary | ICD-10-CM | POA: Diagnosis not present

## 2019-07-28 DIAGNOSIS — M25511 Pain in right shoulder: Secondary | ICD-10-CM | POA: Diagnosis not present

## 2019-08-10 DIAGNOSIS — J449 Chronic obstructive pulmonary disease, unspecified: Secondary | ICD-10-CM | POA: Diagnosis not present

## 2019-08-10 DIAGNOSIS — G894 Chronic pain syndrome: Secondary | ICD-10-CM | POA: Diagnosis not present

## 2019-08-20 DIAGNOSIS — Z131 Encounter for screening for diabetes mellitus: Secondary | ICD-10-CM | POA: Diagnosis not present

## 2019-08-20 DIAGNOSIS — E7800 Pure hypercholesterolemia, unspecified: Secondary | ICD-10-CM | POA: Diagnosis not present

## 2019-08-20 DIAGNOSIS — F17210 Nicotine dependence, cigarettes, uncomplicated: Secondary | ICD-10-CM | POA: Diagnosis not present

## 2019-08-20 DIAGNOSIS — E559 Vitamin D deficiency, unspecified: Secondary | ICD-10-CM | POA: Diagnosis not present

## 2019-08-20 DIAGNOSIS — M129 Arthropathy, unspecified: Secondary | ICD-10-CM | POA: Diagnosis not present

## 2019-08-20 DIAGNOSIS — R350 Frequency of micturition: Secondary | ICD-10-CM | POA: Diagnosis not present

## 2019-08-20 DIAGNOSIS — Z0000 Encounter for general adult medical examination without abnormal findings: Secondary | ICD-10-CM | POA: Diagnosis not present

## 2019-08-20 DIAGNOSIS — R0602 Shortness of breath: Secondary | ICD-10-CM | POA: Diagnosis not present

## 2019-08-20 DIAGNOSIS — R5383 Other fatigue: Secondary | ICD-10-CM | POA: Diagnosis not present

## 2019-08-20 DIAGNOSIS — Z9181 History of falling: Secondary | ICD-10-CM | POA: Diagnosis not present

## 2019-08-20 DIAGNOSIS — Z79899 Other long term (current) drug therapy: Secondary | ICD-10-CM | POA: Diagnosis not present

## 2019-08-26 DIAGNOSIS — M25552 Pain in left hip: Secondary | ICD-10-CM | POA: Diagnosis not present

## 2019-08-26 DIAGNOSIS — Z9181 History of falling: Secondary | ICD-10-CM | POA: Diagnosis not present

## 2019-08-26 DIAGNOSIS — Z20822 Contact with and (suspected) exposure to covid-19: Secondary | ICD-10-CM | POA: Diagnosis not present

## 2019-08-26 DIAGNOSIS — M545 Low back pain: Secondary | ICD-10-CM | POA: Diagnosis not present

## 2019-08-26 DIAGNOSIS — F17210 Nicotine dependence, cigarettes, uncomplicated: Secondary | ICD-10-CM | POA: Diagnosis not present

## 2019-08-26 DIAGNOSIS — M25511 Pain in right shoulder: Secondary | ICD-10-CM | POA: Diagnosis not present

## 2019-08-26 DIAGNOSIS — Z79899 Other long term (current) drug therapy: Secondary | ICD-10-CM | POA: Diagnosis not present

## 2019-08-26 DIAGNOSIS — M25512 Pain in left shoulder: Secondary | ICD-10-CM | POA: Diagnosis not present

## 2019-09-06 MED FILL — Gabapentin Cap 300 MG: ORAL | Qty: 300 | Status: AC

## 2019-09-06 MED FILL — Oxycodone HCl Tab 5 MG: ORAL | Qty: 15 | Status: AC

## 2019-09-06 MED FILL — Albuterol Sulfate Inhal Aero 108 MCG/ACT (90MCG Base Equiv): RESPIRATORY_TRACT | Qty: 6.7 | Status: AC

## 2019-09-07 DIAGNOSIS — Z1231 Encounter for screening mammogram for malignant neoplasm of breast: Secondary | ICD-10-CM | POA: Diagnosis not present

## 2019-09-08 DIAGNOSIS — G894 Chronic pain syndrome: Secondary | ICD-10-CM | POA: Diagnosis not present

## 2019-09-08 DIAGNOSIS — J449 Chronic obstructive pulmonary disease, unspecified: Secondary | ICD-10-CM | POA: Diagnosis not present

## 2019-09-23 DIAGNOSIS — Z9181 History of falling: Secondary | ICD-10-CM | POA: Diagnosis not present

## 2019-09-23 DIAGNOSIS — Z79899 Other long term (current) drug therapy: Secondary | ICD-10-CM | POA: Diagnosis not present

## 2019-09-23 DIAGNOSIS — M25511 Pain in right shoulder: Secondary | ICD-10-CM | POA: Diagnosis not present

## 2019-09-23 DIAGNOSIS — Z20822 Contact with and (suspected) exposure to covid-19: Secondary | ICD-10-CM | POA: Diagnosis not present

## 2019-09-23 DIAGNOSIS — F17210 Nicotine dependence, cigarettes, uncomplicated: Secondary | ICD-10-CM | POA: Diagnosis not present

## 2019-09-23 DIAGNOSIS — M25552 Pain in left hip: Secondary | ICD-10-CM | POA: Diagnosis not present

## 2019-09-23 DIAGNOSIS — M25512 Pain in left shoulder: Secondary | ICD-10-CM | POA: Diagnosis not present

## 2019-09-23 DIAGNOSIS — M545 Low back pain: Secondary | ICD-10-CM | POA: Diagnosis not present

## 2019-09-23 DIAGNOSIS — R1012 Left upper quadrant pain: Secondary | ICD-10-CM | POA: Diagnosis not present

## 2019-10-08 DIAGNOSIS — J449 Chronic obstructive pulmonary disease, unspecified: Secondary | ICD-10-CM | POA: Diagnosis not present

## 2019-10-08 DIAGNOSIS — G894 Chronic pain syndrome: Secondary | ICD-10-CM | POA: Diagnosis not present

## 2019-10-13 DIAGNOSIS — F17210 Nicotine dependence, cigarettes, uncomplicated: Secondary | ICD-10-CM | POA: Diagnosis not present

## 2019-10-13 DIAGNOSIS — Z79899 Other long term (current) drug therapy: Secondary | ICD-10-CM | POA: Diagnosis not present

## 2019-10-13 DIAGNOSIS — M545 Low back pain, unspecified: Secondary | ICD-10-CM | POA: Diagnosis not present

## 2019-10-13 DIAGNOSIS — Z9181 History of falling: Secondary | ICD-10-CM | POA: Diagnosis not present

## 2019-10-14 DIAGNOSIS — M19049 Primary osteoarthritis, unspecified hand: Secondary | ICD-10-CM | POA: Diagnosis not present

## 2019-10-14 DIAGNOSIS — M65319 Trigger thumb, unspecified thumb: Secondary | ICD-10-CM | POA: Diagnosis not present

## 2019-10-14 DIAGNOSIS — M13831 Other specified arthritis, right wrist: Secondary | ICD-10-CM | POA: Diagnosis not present

## 2019-11-03 DIAGNOSIS — Z9181 History of falling: Secondary | ICD-10-CM | POA: Diagnosis not present

## 2019-11-03 DIAGNOSIS — F17210 Nicotine dependence, cigarettes, uncomplicated: Secondary | ICD-10-CM | POA: Diagnosis not present

## 2019-11-03 DIAGNOSIS — M25552 Pain in left hip: Secondary | ICD-10-CM | POA: Diagnosis not present

## 2019-11-03 DIAGNOSIS — Z79899 Other long term (current) drug therapy: Secondary | ICD-10-CM | POA: Diagnosis not present

## 2019-11-03 DIAGNOSIS — M545 Low back pain, unspecified: Secondary | ICD-10-CM | POA: Diagnosis not present

## 2019-11-03 DIAGNOSIS — M25512 Pain in left shoulder: Secondary | ICD-10-CM | POA: Diagnosis not present

## 2019-11-03 DIAGNOSIS — M25511 Pain in right shoulder: Secondary | ICD-10-CM | POA: Diagnosis not present

## 2019-11-30 DIAGNOSIS — M25511 Pain in right shoulder: Secondary | ICD-10-CM | POA: Diagnosis not present

## 2019-11-30 DIAGNOSIS — M25552 Pain in left hip: Secondary | ICD-10-CM | POA: Diagnosis not present

## 2019-11-30 DIAGNOSIS — M545 Low back pain, unspecified: Secondary | ICD-10-CM | POA: Diagnosis not present

## 2019-11-30 DIAGNOSIS — Z79899 Other long term (current) drug therapy: Secondary | ICD-10-CM | POA: Diagnosis not present

## 2019-11-30 DIAGNOSIS — M25512 Pain in left shoulder: Secondary | ICD-10-CM | POA: Diagnosis not present

## 2019-11-30 DIAGNOSIS — Z9181 History of falling: Secondary | ICD-10-CM | POA: Diagnosis not present

## 2019-11-30 DIAGNOSIS — F17210 Nicotine dependence, cigarettes, uncomplicated: Secondary | ICD-10-CM | POA: Diagnosis not present

## 2019-12-03 IMAGING — MR MRI OF THE LEFT SHOULDER WITHOUT CONTRAST
4 of 5 series · 19 of 40 positions shown · non-contrast
Comparison: None.

CLINICAL DATA: Bilateral shoulder pain for 5 years. History of
osteoarthritis. No acute injury or prior relevant surgery.

EXAM:
MRI OF THE LEFT SHOULDER WITHOUT CONTRAST
TECHNIQUE: Multiplanar, multisequence MR imaging of the shoulder was performed.
No intravenous contrast was administered.

[Series 8: T2 fat-sat · oblique · left · 4.0mm · 0.22mm/px · 3 of 21 slices shown (1 of 2)]
[im 4/21]
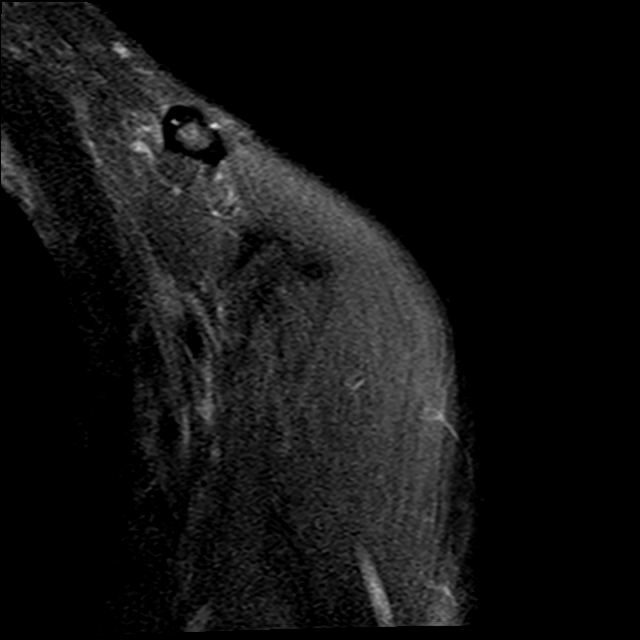
[im 11/21]
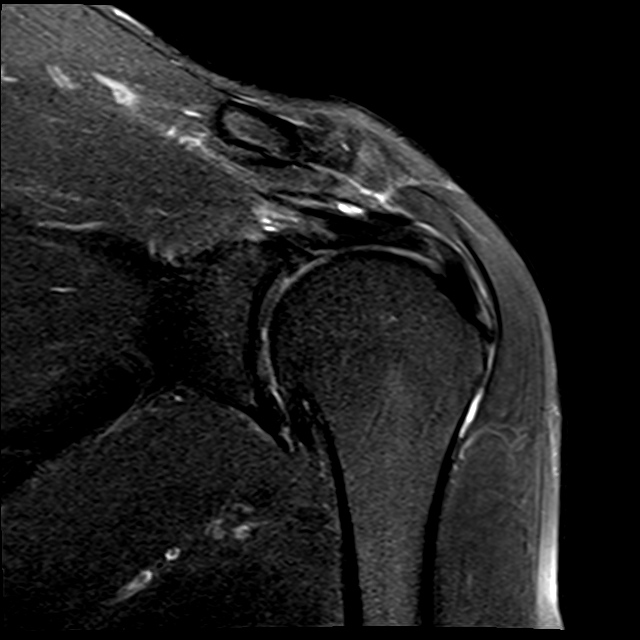
[im 17/21]
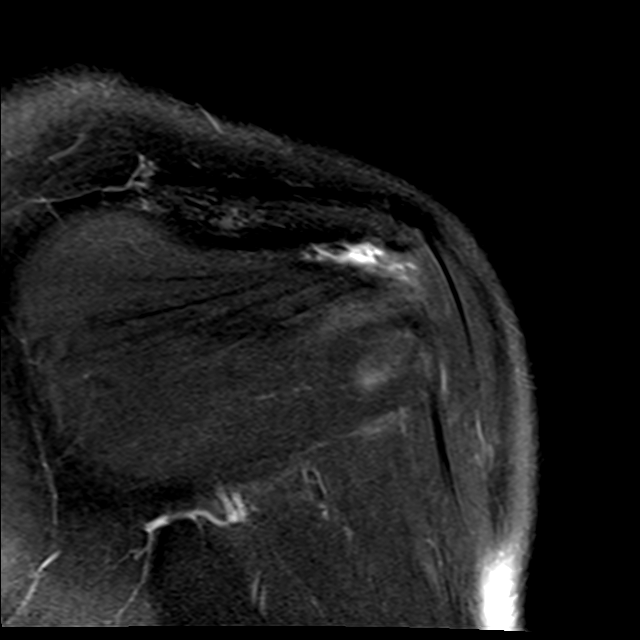

[Series 9: PD fat-sat · oblique · left · 4.0mm · 0.22mm/px · 8 of 21 slices shown (1 of 2)]
[im 1/21]
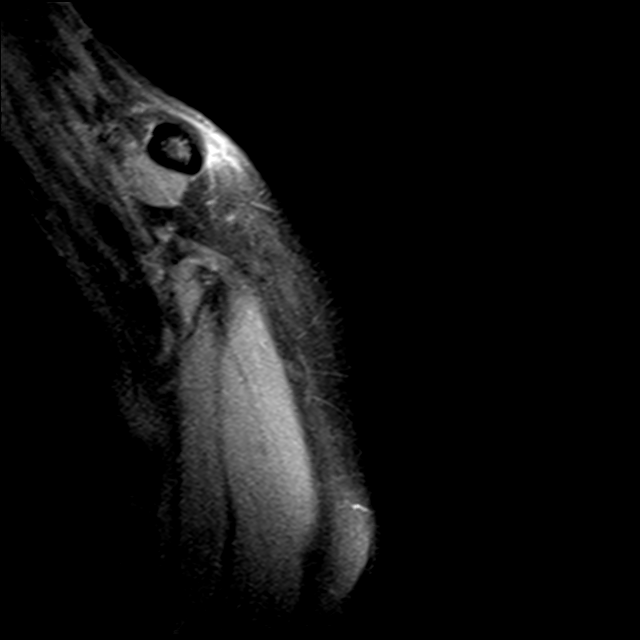
[im 3/21]
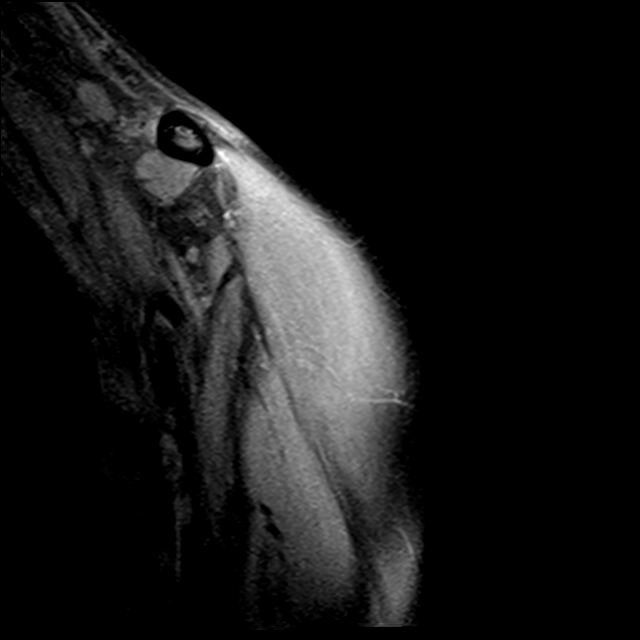
[im 6/21]
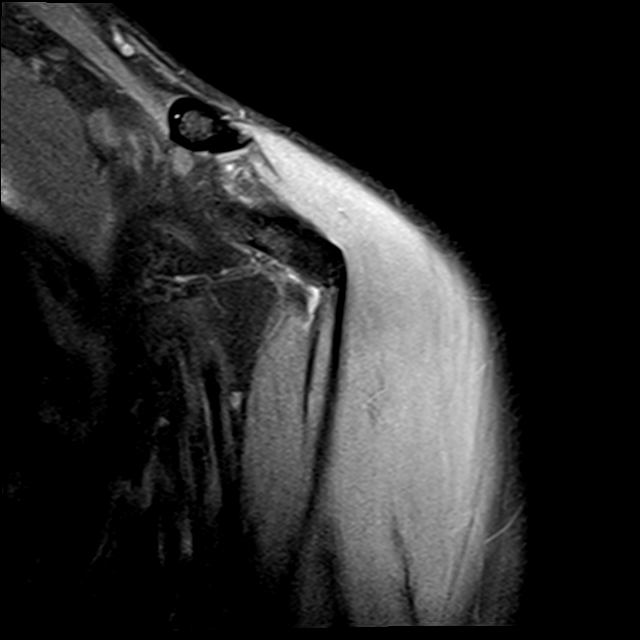
[im 9/21]
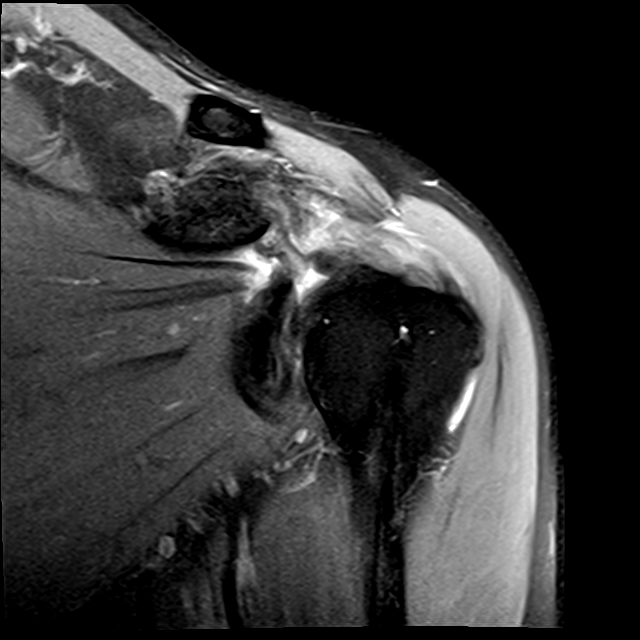
[im 12/21]
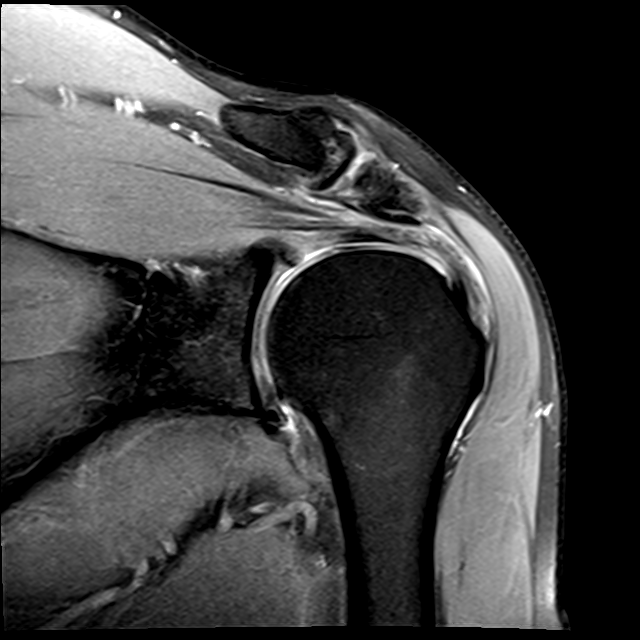
[im 15/21]
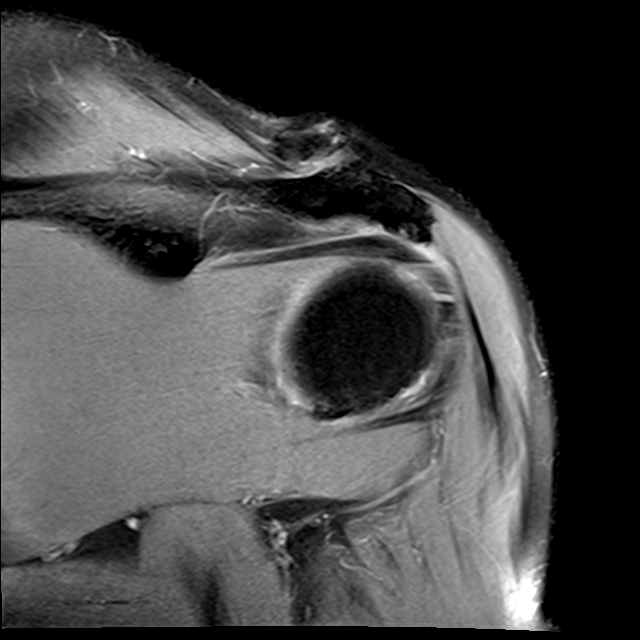
[im 18/21]
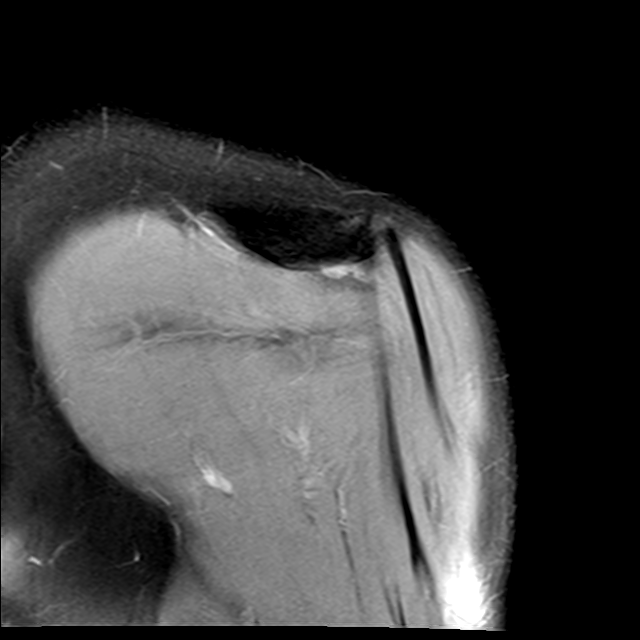
[im 21/21]
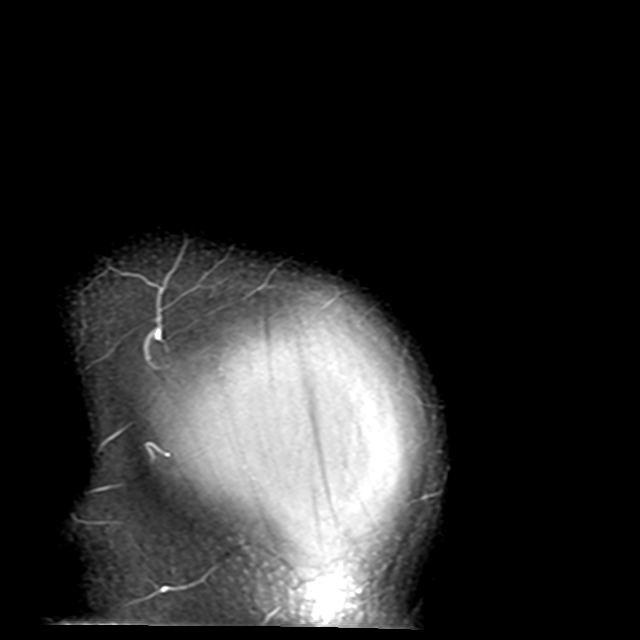

[Series 10: T2 fat-sat · oblique · left · 4.0mm · 0.44mm/px · 3 of 23 slices shown (2 of 2)]
[im 4/23]
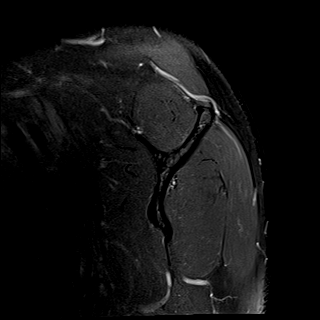
[im 13/23]
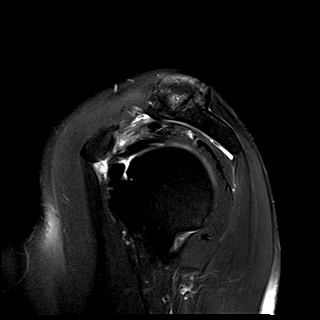
[im 19/23]
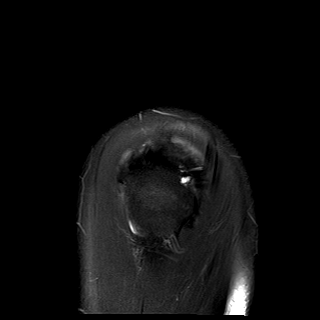

[Series 12: PD fat-sat · axial · left · 4.0mm · 0.44mm/px · z∈[-73,+18]mm · 5 of 24 slices shown (2 of 2)]
[im 1/24]
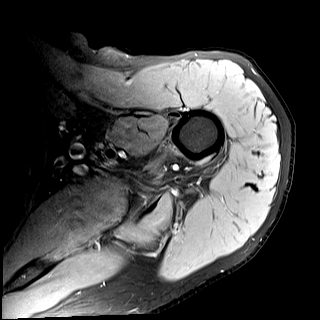
[im 3/24]
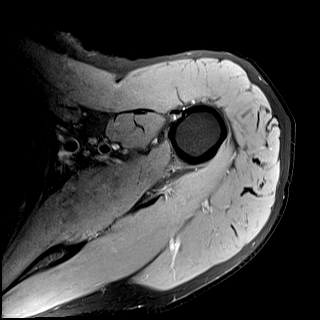
[im 6/24]
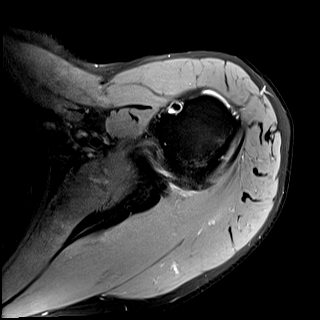
[im 12/24]
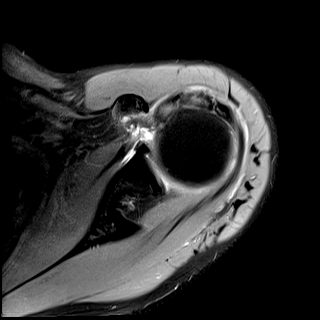
[im 21/24]
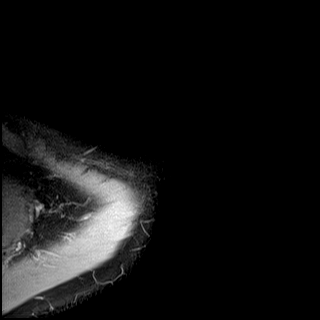

[19 of 40 positions shown; findings below may reference images not displayed]

FINDINGS: Rotator cuff:

Muscles: Mild supraspinatus tendinosis without tear. The
infraspinatus, subscapularis and teres minor tendons appear normal.

Biceps long head:  Intact and normally positioned.

Acromioclavicular Joint: The acromion is type 2. There are
mild-to-moderate acromioclavicular degenerative changes and trace
fluid in the subacromial-subdeltoid bursa.

Glenohumeral Joint: Minimal glenohumeral degenerative changes. No
significant shoulder joint effusion.

Labrum: Labral evaluation limited by the lack of joint fluid. Mild
posterior labral degeneration without discrete tear or paralabral
cyst.

Bones: No acute or significant extra-articular osseous findings.

Other: No significant soft tissue findings.
IMPRESSION: 1. No acute findings or evidence of internal derangement.
2. Mild supraspinatus tendinosis.  No evidence of rotator cuff tear.
3. Mild-to-moderate acromioclavicular degenerative changes. Mild
glenohumeral degenerative changes.

## 2019-12-27 DIAGNOSIS — M545 Low back pain, unspecified: Secondary | ICD-10-CM | POA: Diagnosis not present

## 2019-12-27 DIAGNOSIS — Z9181 History of falling: Secondary | ICD-10-CM | POA: Diagnosis not present

## 2019-12-27 DIAGNOSIS — M25552 Pain in left hip: Secondary | ICD-10-CM | POA: Diagnosis not present

## 2019-12-27 DIAGNOSIS — M25512 Pain in left shoulder: Secondary | ICD-10-CM | POA: Diagnosis not present

## 2019-12-27 DIAGNOSIS — F17210 Nicotine dependence, cigarettes, uncomplicated: Secondary | ICD-10-CM | POA: Diagnosis not present

## 2019-12-27 DIAGNOSIS — Z79899 Other long term (current) drug therapy: Secondary | ICD-10-CM | POA: Diagnosis not present

## 2019-12-27 DIAGNOSIS — M25511 Pain in right shoulder: Secondary | ICD-10-CM | POA: Diagnosis not present

## 2020-01-13 DIAGNOSIS — G894 Chronic pain syndrome: Secondary | ICD-10-CM | POA: Diagnosis not present

## 2020-01-13 DIAGNOSIS — J449 Chronic obstructive pulmonary disease, unspecified: Secondary | ICD-10-CM | POA: Diagnosis not present

## 2020-01-17 ENCOUNTER — Emergency Department (HOSPITAL_COMMUNITY)
Admission: EM | Admit: 2020-01-17 | Discharge: 2020-01-17 | Discharge status: Absent without leave | Payer: Medicare Other

## 2020-01-17 ENCOUNTER — Other Ambulatory Visit: Payer: Self-pay

## 2020-01-17 ENCOUNTER — Encounter (HOSPITAL_COMMUNITY): Payer: Self-pay

## 2020-01-17 ENCOUNTER — Emergency Department (HOSPITAL_COMMUNITY)
Admission: EM | Admit: 2020-01-17 | Discharge: 2020-01-17 | Disposition: A | Discharge status: Home / Self Care | Payer: Medicare Other | Attending: Emergency Medicine | Admitting: Emergency Medicine

## 2020-01-17 DIAGNOSIS — M549 Dorsalgia, unspecified: Secondary | ICD-10-CM | POA: Insufficient documentation

## 2020-01-17 DIAGNOSIS — Z5321 Procedure and treatment not carried out due to patient leaving prior to being seen by health care provider: Secondary | ICD-10-CM | POA: Insufficient documentation

## 2020-01-17 DIAGNOSIS — M791 Myalgia, unspecified site: Secondary | ICD-10-CM | POA: Diagnosis not present

## 2020-01-17 NOTE — ED Triage Notes (Signed)
Pt presents with c/o generalized body aches and back pain. Pt unsure if she has Covid.

## 2020-01-17 NOTE — ED Notes (Signed)
Pt did have a fever in triage but pt has an allergy to vicodin with Tylenol and ibuprofen. Will monitor temp.

## 2020-01-25 DIAGNOSIS — M25511 Pain in right shoulder: Secondary | ICD-10-CM | POA: Diagnosis not present

## 2020-01-25 DIAGNOSIS — M25552 Pain in left hip: Secondary | ICD-10-CM | POA: Diagnosis not present

## 2020-01-25 DIAGNOSIS — M25512 Pain in left shoulder: Secondary | ICD-10-CM | POA: Diagnosis not present

## 2020-01-25 DIAGNOSIS — Z9181 History of falling: Secondary | ICD-10-CM | POA: Diagnosis not present

## 2020-01-25 DIAGNOSIS — F17210 Nicotine dependence, cigarettes, uncomplicated: Secondary | ICD-10-CM | POA: Diagnosis not present

## 2020-01-25 DIAGNOSIS — M545 Low back pain, unspecified: Secondary | ICD-10-CM | POA: Diagnosis not present

## 2020-01-25 DIAGNOSIS — Z79899 Other long term (current) drug therapy: Secondary | ICD-10-CM | POA: Diagnosis not present

## 2020-01-26 DIAGNOSIS — R059 Cough, unspecified: Secondary | ICD-10-CM | POA: Diagnosis not present

## 2020-01-26 DIAGNOSIS — J069 Acute upper respiratory infection, unspecified: Secondary | ICD-10-CM | POA: Diagnosis not present

## 2020-02-08 DIAGNOSIS — G894 Chronic pain syndrome: Secondary | ICD-10-CM | POA: Diagnosis not present

## 2020-02-08 DIAGNOSIS — J449 Chronic obstructive pulmonary disease, unspecified: Secondary | ICD-10-CM | POA: Diagnosis not present

## 2020-02-22 DIAGNOSIS — Z79899 Other long term (current) drug therapy: Secondary | ICD-10-CM | POA: Diagnosis not present

## 2020-02-22 DIAGNOSIS — Z6820 Body mass index (BMI) 20.0-20.9, adult: Secondary | ICD-10-CM | POA: Diagnosis not present

## 2020-02-22 DIAGNOSIS — F17210 Nicotine dependence, cigarettes, uncomplicated: Secondary | ICD-10-CM | POA: Diagnosis not present

## 2020-02-22 DIAGNOSIS — M25552 Pain in left hip: Secondary | ICD-10-CM | POA: Diagnosis not present

## 2020-02-22 DIAGNOSIS — Z9181 History of falling: Secondary | ICD-10-CM | POA: Diagnosis not present

## 2020-02-22 DIAGNOSIS — M25512 Pain in left shoulder: Secondary | ICD-10-CM | POA: Diagnosis not present

## 2020-02-22 DIAGNOSIS — M545 Low back pain, unspecified: Secondary | ICD-10-CM | POA: Diagnosis not present

## 2020-02-22 DIAGNOSIS — M25511 Pain in right shoulder: Secondary | ICD-10-CM | POA: Diagnosis not present

## 2020-02-24 DIAGNOSIS — M1811 Unilateral primary osteoarthritis of first carpometacarpal joint, right hand: Secondary | ICD-10-CM | POA: Diagnosis not present

## 2020-02-24 DIAGNOSIS — M65311 Trigger thumb, right thumb: Secondary | ICD-10-CM | POA: Diagnosis not present

## 2020-03-07 DIAGNOSIS — J449 Chronic obstructive pulmonary disease, unspecified: Secondary | ICD-10-CM | POA: Diagnosis not present

## 2020-03-07 DIAGNOSIS — G894 Chronic pain syndrome: Secondary | ICD-10-CM | POA: Diagnosis not present

## 2020-03-21 DIAGNOSIS — M25511 Pain in right shoulder: Secondary | ICD-10-CM | POA: Diagnosis not present

## 2020-03-21 DIAGNOSIS — M25512 Pain in left shoulder: Secondary | ICD-10-CM | POA: Diagnosis not present

## 2020-03-21 DIAGNOSIS — Z6824 Body mass index (BMI) 24.0-24.9, adult: Secondary | ICD-10-CM | POA: Diagnosis not present

## 2020-03-21 DIAGNOSIS — Z79899 Other long term (current) drug therapy: Secondary | ICD-10-CM | POA: Diagnosis not present

## 2020-03-21 DIAGNOSIS — F17210 Nicotine dependence, cigarettes, uncomplicated: Secondary | ICD-10-CM | POA: Diagnosis not present

## 2020-03-21 DIAGNOSIS — M25552 Pain in left hip: Secondary | ICD-10-CM | POA: Diagnosis not present

## 2020-03-21 DIAGNOSIS — M545 Low back pain, unspecified: Secondary | ICD-10-CM | POA: Diagnosis not present

## 2020-03-21 DIAGNOSIS — Z9181 History of falling: Secondary | ICD-10-CM | POA: Diagnosis not present

## 2020-03-21 DIAGNOSIS — J40 Bronchitis, not specified as acute or chronic: Secondary | ICD-10-CM | POA: Diagnosis not present

## 2020-03-27 DIAGNOSIS — Z9181 History of falling: Secondary | ICD-10-CM | POA: Diagnosis not present

## 2020-03-27 DIAGNOSIS — Z87891 Personal history of nicotine dependence: Secondary | ICD-10-CM | POA: Diagnosis not present

## 2020-03-27 DIAGNOSIS — E559 Vitamin D deficiency, unspecified: Secondary | ICD-10-CM | POA: Diagnosis not present

## 2020-03-27 DIAGNOSIS — Z131 Encounter for screening for diabetes mellitus: Secondary | ICD-10-CM | POA: Diagnosis not present

## 2020-03-27 DIAGNOSIS — Z1159 Encounter for screening for other viral diseases: Secondary | ICD-10-CM | POA: Diagnosis not present

## 2020-03-27 DIAGNOSIS — R0602 Shortness of breath: Secondary | ICD-10-CM | POA: Diagnosis not present

## 2020-03-27 DIAGNOSIS — R5383 Other fatigue: Secondary | ICD-10-CM | POA: Diagnosis not present

## 2020-03-27 DIAGNOSIS — M129 Arthropathy, unspecified: Secondary | ICD-10-CM | POA: Diagnosis not present

## 2020-03-27 DIAGNOSIS — Z79899 Other long term (current) drug therapy: Secondary | ICD-10-CM | POA: Diagnosis not present

## 2020-03-27 DIAGNOSIS — Z681 Body mass index (BMI) 19 or less, adult: Secondary | ICD-10-CM | POA: Diagnosis not present

## 2020-03-27 DIAGNOSIS — E7800 Pure hypercholesterolemia, unspecified: Secondary | ICD-10-CM | POA: Diagnosis not present

## 2020-03-27 DIAGNOSIS — Z0000 Encounter for general adult medical examination without abnormal findings: Secondary | ICD-10-CM | POA: Diagnosis not present

## 2020-03-27 DIAGNOSIS — F17210 Nicotine dependence, cigarettes, uncomplicated: Secondary | ICD-10-CM | POA: Diagnosis not present

## 2020-03-29 DIAGNOSIS — J449 Chronic obstructive pulmonary disease, unspecified: Secondary | ICD-10-CM | POA: Diagnosis not present

## 2020-03-29 DIAGNOSIS — R0602 Shortness of breath: Secondary | ICD-10-CM | POA: Diagnosis not present

## 2020-03-29 DIAGNOSIS — F17210 Nicotine dependence, cigarettes, uncomplicated: Secondary | ICD-10-CM | POA: Diagnosis not present

## 2020-03-29 DIAGNOSIS — R942 Abnormal results of pulmonary function studies: Secondary | ICD-10-CM | POA: Diagnosis not present

## 2020-03-29 DIAGNOSIS — Z87891 Personal history of nicotine dependence: Secondary | ICD-10-CM | POA: Diagnosis not present

## 2020-04-07 DIAGNOSIS — J449 Chronic obstructive pulmonary disease, unspecified: Secondary | ICD-10-CM | POA: Diagnosis not present

## 2020-04-10 DIAGNOSIS — Z87891 Personal history of nicotine dependence: Secondary | ICD-10-CM | POA: Diagnosis not present

## 2020-04-20 DIAGNOSIS — M545 Low back pain, unspecified: Secondary | ICD-10-CM | POA: Diagnosis not present

## 2020-04-20 DIAGNOSIS — M25512 Pain in left shoulder: Secondary | ICD-10-CM | POA: Diagnosis not present

## 2020-04-20 DIAGNOSIS — Z6820 Body mass index (BMI) 20.0-20.9, adult: Secondary | ICD-10-CM | POA: Diagnosis not present

## 2020-04-20 DIAGNOSIS — Z79899 Other long term (current) drug therapy: Secondary | ICD-10-CM | POA: Diagnosis not present

## 2020-04-20 DIAGNOSIS — F17210 Nicotine dependence, cigarettes, uncomplicated: Secondary | ICD-10-CM | POA: Diagnosis not present

## 2020-04-20 DIAGNOSIS — Z9181 History of falling: Secondary | ICD-10-CM | POA: Diagnosis not present

## 2020-04-20 DIAGNOSIS — M25552 Pain in left hip: Secondary | ICD-10-CM | POA: Diagnosis not present

## 2020-04-20 DIAGNOSIS — M25511 Pain in right shoulder: Secondary | ICD-10-CM | POA: Diagnosis not present

## 2020-05-08 DIAGNOSIS — J449 Chronic obstructive pulmonary disease, unspecified: Secondary | ICD-10-CM | POA: Diagnosis not present

## 2020-05-22 DIAGNOSIS — F17210 Nicotine dependence, cigarettes, uncomplicated: Secondary | ICD-10-CM | POA: Diagnosis not present

## 2020-05-22 DIAGNOSIS — M545 Low back pain, unspecified: Secondary | ICD-10-CM | POA: Diagnosis not present

## 2020-05-22 DIAGNOSIS — M25511 Pain in right shoulder: Secondary | ICD-10-CM | POA: Diagnosis not present

## 2020-05-22 DIAGNOSIS — Z9181 History of falling: Secondary | ICD-10-CM | POA: Diagnosis not present

## 2020-05-22 DIAGNOSIS — M25552 Pain in left hip: Secondary | ICD-10-CM | POA: Diagnosis not present

## 2020-05-22 DIAGNOSIS — Z79899 Other long term (current) drug therapy: Secondary | ICD-10-CM | POA: Diagnosis not present

## 2020-05-22 DIAGNOSIS — M25512 Pain in left shoulder: Secondary | ICD-10-CM | POA: Diagnosis not present

## 2020-06-07 ENCOUNTER — Ambulatory Visit (HOSPITAL_COMMUNITY)
Admission: EM | Admit: 2020-06-07 | Discharge: 2020-06-07 | Disposition: A | Discharge status: Home / Self Care | Payer: Medicare Other

## 2020-06-07 ENCOUNTER — Encounter (HOSPITAL_COMMUNITY): Payer: Self-pay

## 2020-06-07 ENCOUNTER — Other Ambulatory Visit: Payer: Self-pay

## 2020-06-07 DIAGNOSIS — A084 Viral intestinal infection, unspecified: Secondary | ICD-10-CM

## 2020-06-07 MED ORDER — ONDANSETRON 4 MG PO TBDP: 4.0000 mg | ORAL_TABLET | Freq: Once | ORAL | Status: DC

## 2020-06-07 MED ORDER — ONDANSETRON 4 MG PO TBDP
ORAL_TABLET | ORAL | Status: AC
  Filled 2020-06-07: qty 1

## 2020-06-07 MED ORDER — ALUM & MAG HYDROXIDE-SIMETH 400-400-40 MG/5ML PO SUSP: 10.0000 mL | Freq: Four times a day (QID) | ORAL | 0 refills | Status: AC | PRN

## 2020-06-07 MED ORDER — LIDOCAINE VISCOUS HCL 2 % MT SOLN
15.0000 mL | Freq: Once | OROMUCOSAL | Status: AC
  Administered 2020-06-07: 15 mL via ORAL

## 2020-06-07 MED ORDER — ONDANSETRON HCL 4 MG/2ML IJ SOLN: 4.0000 mg | Freq: Once | INTRAMUSCULAR | Status: DC

## 2020-06-07 MED ORDER — BACITRACIN ZINC 500 UNIT/GM EX OINT
TOPICAL_OINTMENT | CUTANEOUS | Status: AC
  Filled 2020-06-07: qty 0.9

## 2020-06-07 MED ORDER — ALUM & MAG HYDROXIDE-SIMETH 200-200-20 MG/5ML PO SUSP
30.0000 mL | Freq: Once | ORAL | Status: AC
  Administered 2020-06-07: 30 mL via ORAL

## 2020-06-07 MED ORDER — LIDOCAINE VISCOUS HCL 2 % MT SOLN
OROMUCOSAL | Status: AC
  Filled 2020-06-07: qty 15

## 2020-06-07 MED ORDER — ONDANSETRON HCL 4 MG PO TABS
4.0000 mg | ORAL_TABLET | Freq: Four times a day (QID) | ORAL | 0 refills | Status: AC
Start: 2020-06-07 — End: ?

## 2020-06-07 MED ORDER — ALUM & MAG HYDROXIDE-SIMETH 200-200-20 MG/5ML PO SUSP
ORAL | Status: AC
  Filled 2020-06-07: qty 30

## 2020-06-07 MED ORDER — ONDANSETRON 4 MG PO TBDP
8.0000 mg | ORAL_TABLET | Freq: Once | ORAL | Status: AC
  Administered 2020-06-07: 8 mg via ORAL
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
## 2020-06-07 NOTE — Discharge Instructions (Addendum)
Can use nausea medication every 6 hours starting at 11 pm tonight  Can use 10 mL of solution every six hours  Once nausea decreases attempt to rehydrate using water, Gatorade or similar products   If symptoms worsen go to nearest emergency department

## 2020-06-07 NOTE — ED Triage Notes (Signed)
Pt in with c/o generalized stomach pain and vomiting that started this morning  Pt took milk of magnesia with no relief  Pt states she ate tuna yesterday which she thinks might be causing her sxs

## 2020-07-11 DIAGNOSIS — G894 Chronic pain syndrome: Secondary | ICD-10-CM | POA: Diagnosis not present

## 2020-07-18 DIAGNOSIS — M545 Low back pain, unspecified: Secondary | ICD-10-CM | POA: Diagnosis not present

## 2020-07-18 DIAGNOSIS — Z79899 Other long term (current) drug therapy: Secondary | ICD-10-CM | POA: Diagnosis not present

## 2020-07-18 DIAGNOSIS — F17210 Nicotine dependence, cigarettes, uncomplicated: Secondary | ICD-10-CM | POA: Diagnosis not present

## 2020-07-18 DIAGNOSIS — M25512 Pain in left shoulder: Secondary | ICD-10-CM | POA: Diagnosis not present

## 2020-07-18 DIAGNOSIS — Z6820 Body mass index (BMI) 20.0-20.9, adult: Secondary | ICD-10-CM | POA: Diagnosis not present

## 2020-07-18 DIAGNOSIS — M25552 Pain in left hip: Secondary | ICD-10-CM | POA: Diagnosis not present

## 2020-07-18 DIAGNOSIS — M25511 Pain in right shoulder: Secondary | ICD-10-CM | POA: Diagnosis not present

## 2020-07-18 DIAGNOSIS — Z9181 History of falling: Secondary | ICD-10-CM | POA: Diagnosis not present

## 2020-07-20 DIAGNOSIS — Z79899 Other long term (current) drug therapy: Secondary | ICD-10-CM | POA: Diagnosis not present

## 2020-08-07 DIAGNOSIS — G894 Chronic pain syndrome: Secondary | ICD-10-CM | POA: Diagnosis not present

## 2020-08-11 DIAGNOSIS — Z20822 Contact with and (suspected) exposure to covid-19: Secondary | ICD-10-CM | POA: Diagnosis not present

## 2020-08-18 DIAGNOSIS — M545 Low back pain, unspecified: Secondary | ICD-10-CM | POA: Diagnosis not present

## 2020-08-18 DIAGNOSIS — Z79899 Other long term (current) drug therapy: Secondary | ICD-10-CM | POA: Diagnosis not present

## 2020-08-18 DIAGNOSIS — Z9181 History of falling: Secondary | ICD-10-CM | POA: Diagnosis not present

## 2020-08-18 DIAGNOSIS — M25552 Pain in left hip: Secondary | ICD-10-CM | POA: Diagnosis not present

## 2020-08-18 DIAGNOSIS — M25562 Pain in left knee: Secondary | ICD-10-CM | POA: Diagnosis not present

## 2020-08-18 DIAGNOSIS — F17210 Nicotine dependence, cigarettes, uncomplicated: Secondary | ICD-10-CM | POA: Diagnosis not present

## 2020-08-18 DIAGNOSIS — M25561 Pain in right knee: Secondary | ICD-10-CM | POA: Diagnosis not present

## 2020-08-21 DIAGNOSIS — Z79899 Other long term (current) drug therapy: Secondary | ICD-10-CM | POA: Diagnosis not present

## 2020-08-24 DIAGNOSIS — M1811 Unilateral primary osteoarthritis of first carpometacarpal joint, right hand: Secondary | ICD-10-CM | POA: Diagnosis not present

## 2020-08-24 DIAGNOSIS — M65311 Trigger thumb, right thumb: Secondary | ICD-10-CM | POA: Diagnosis not present

## 2020-08-27 ENCOUNTER — Emergency Department (HOSPITAL_COMMUNITY): Payer: Medicare Other

## 2020-08-27 ENCOUNTER — Emergency Department (HOSPITAL_COMMUNITY)
Admission: EM | Admit: 2020-08-27 | Discharge: 2020-08-28 | Disposition: A | Discharge status: Home / Self Care | Payer: Medicare Other | Attending: Emergency Medicine | Admitting: Emergency Medicine

## 2020-08-27 DIAGNOSIS — R0902 Hypoxemia: Secondary | ICD-10-CM | POA: Diagnosis not present

## 2020-08-27 DIAGNOSIS — R509 Fever, unspecified: Secondary | ICD-10-CM | POA: Diagnosis not present

## 2020-08-27 DIAGNOSIS — Z79899 Other long term (current) drug therapy: Secondary | ICD-10-CM | POA: Diagnosis not present

## 2020-08-27 DIAGNOSIS — K5900 Constipation, unspecified: Secondary | ICD-10-CM | POA: Insufficient documentation

## 2020-08-27 DIAGNOSIS — U071 COVID-19: Secondary | ICD-10-CM | POA: Diagnosis not present

## 2020-08-27 DIAGNOSIS — J449 Chronic obstructive pulmonary disease, unspecified: Secondary | ICD-10-CM | POA: Diagnosis not present

## 2020-08-27 DIAGNOSIS — J45909 Unspecified asthma, uncomplicated: Secondary | ICD-10-CM | POA: Insufficient documentation

## 2020-08-27 DIAGNOSIS — Z743 Need for continuous supervision: Secondary | ICD-10-CM | POA: Diagnosis not present

## 2020-08-27 DIAGNOSIS — I878 Other specified disorders of veins: Secondary | ICD-10-CM | POA: Diagnosis not present

## 2020-08-27 DIAGNOSIS — F17210 Nicotine dependence, cigarettes, uncomplicated: Secondary | ICD-10-CM | POA: Diagnosis not present

## 2020-08-27 DIAGNOSIS — R109 Unspecified abdominal pain: Secondary | ICD-10-CM | POA: Diagnosis not present

## 2020-08-27 LAB — COMPREHENSIVE METABOLIC PANEL
ALT: 16 U/L (ref 0–44)
AST: 18 U/L (ref 15–41)
Albumin: 3.6 g/dL (ref 3.5–5.0)
Alkaline Phosphatase: 54 U/L (ref 38–126)
Anion gap: 8 (ref 5–15)
BUN: 7 mg/dL — ABNORMAL LOW (ref 8–23)
CO2: 26 mmol/L (ref 22–32)
Calcium: 8.8 mg/dL — ABNORMAL LOW (ref 8.9–10.3)
Chloride: 99 mmol/L (ref 98–111)
Creatinine, Ser: 0.76 mg/dL (ref 0.44–1.00)
GFR, Estimated: >60 mL/min (ref >60)
Glucose, Bld: 121 mg/dL — ABNORMAL HIGH (ref 70–99)
Potassium: 3.5 mmol/L (ref 3.5–5.1)
Sodium: 133 mmol/L — ABNORMAL LOW (ref 135–145)
Total Bilirubin: 0.9 mg/dL (ref 0.3–1.2)
Total Protein: 6.4 g/dL — ABNORMAL LOW (ref 6.5–8.1)

## 2020-08-27 LAB — CBC WITH DIFFERENTIAL/PLATELET
Abs Immature Granulocytes: 0.02 10*3/uL (ref 0.00–0.07)
Basophils Absolute: 0 10*3/uL (ref 0.0–0.1)
Basophils Relative: 0 %
Eosinophils Absolute: 0 10*3/uL (ref 0.0–0.5)
Eosinophils Relative: 0 %
HCT: 46.8 % — ABNORMAL HIGH (ref 36.0–46.0)
Hemoglobin: 15.3 g/dL — ABNORMAL HIGH (ref 12.0–15.0)
Immature Granulocytes: 0 %
Lymphocytes Relative: 6 %
Lymphs Abs: 0.3 10*3/uL — ABNORMAL LOW (ref 0.7–4.0)
MCH: 27.4 pg (ref 26.0–34.0)
MCHC: 32.7 g/dL (ref 30.0–36.0)
MCV: 83.7 fL (ref 80.0–100.0)
Monocytes Absolute: 0.7 10*3/uL (ref 0.1–1.0)
Monocytes Relative: 13 %
Neutro Abs: 4 10*3/uL (ref 1.7–7.7)
Neutrophils Relative %: 81 %
Platelets: 235 10*3/uL (ref 150–400)
RBC: 5.59 MIL/uL — ABNORMAL HIGH (ref 3.87–5.11)
RDW: 13.8 % (ref 11.5–15.5)
WBC: 5 10*3/uL (ref 4.0–10.5)
nRBC: 0 % (ref 0.0–0.2)

## 2020-08-27 LAB — RESP PANEL BY RT-PCR (FLU A&B, COVID) ARPGX2
Influenza A by PCR: NEGATIVE
Influenza B by PCR: NEGATIVE
SARS Coronavirus 2 by RT PCR: POSITIVE — AB

## 2020-08-27 LAB — LIPASE, BLOOD: Lipase: 23 U/L (ref 11–51)

## 2020-08-27 LAB — ETHANOL: Alcohol, Ethyl (B): <10 mg/dL (ref <10)

## 2020-08-27 LAB — LACTIC ACID, PLASMA: Lactic Acid, Venous: 1 mmol/L (ref 0.5–1.9)

## 2020-08-27 MED ORDER — ACETAMINOPHEN 500 MG PO TABS
1000.0000 mg | ORAL_TABLET | Freq: Once | ORAL | Status: AC
  Administered 2020-08-27: 1000 mg via ORAL
  Filled 2020-08-27: qty 2

## 2020-08-27 MED ORDER — SODIUM CHLORIDE 0.9 % IV BOLUS
500.0000 mL | Freq: Once | INTRAVENOUS | Status: AC
  Administered 2020-08-27: 500 mL via INTRAVENOUS

## 2020-08-27 MED ORDER — FLEET ENEMA 7-19 GM/118ML RE ENEM
1.0000 | ENEMA | Freq: Once | RECTAL | Status: AC
  Administered 2020-08-27: 1 via RECTAL
  Filled 2020-08-27: qty 1

## 2020-08-27 NOTE — ED Notes (Signed)
Patient transported to X-ray 

## 2020-08-27 NOTE — ED Notes (Signed)
Enema administered and patient assisted to bedside commode. Patient reports feeling no different regarding constipation and passing stool. Patient instructed to use call bell when ready to return to bed.

## 2020-08-27 NOTE — ED Triage Notes (Signed)
Pt BIB [facility] from home after having constipation since Friday. Pt reports to EMS that she is usually able to digitally relieve symptoms, however today she was unable to do so and called EMS. Pt also tried Milk of Magnesia with no success. Pt takes opioids for pain. EMS reports GCS of 15, RR 18.

## 2020-08-27 NOTE — ED Provider Notes (Signed)
[facility] EMERGENCY DEPARTMENT Provider Note   CSN: [id number] Arrival date & time: 08/27/20  1851     History Chief Complaint  Patient presents with   Constipation    Dawn Lane is a 70 y.o. female.  70 year old female with prior medical history as detailed below presents for evaluation.  She complains of constipation she reports that her last bowel movement was 2 days prior.  She feels a hard stool at her rectum that she cannot pass.  She reports using milk of magnesia without improvement.  She denies associated nausea or vomiting.  She is requesting an enema for her constipation.  She denies fever at home.  The history is provided by the patient.  Constipation Severity:  Moderate Time since last bowel movement:  2 days Timing:  Constant Progression:  Unchanged Chronicity:  New Stool description:  None produced Relieved by:  Nothing Worsened by:  Nothing     Past Medical History:  Diagnosis Date   Anxiety 2011   Arthritis    Asthma    Emphysema    Hemorrhoids    Polysubstance abuse (HCC) 2011   4 day [facility] admission for this. Cocaine, ETOH, MJA,    Tobacco use     Patient Active Problem List   Diagnosis Date Noted   COPD active smoker/ pfts pending 01/04/2016   Colitis 08/04/2013   Abdominal pain 08/04/2013   Nausea, vomiting and diarrhea 08/04/2013   Cigarette smoker 08/04/2013   Dyspnea 08/02/2010    Past Surgical History:  Procedure Laterality Date   COLONOSCOPY N/A 08/07/2013   Procedure: COLONOSCOPY;  Surgeon: Iva Boop, MD;  Location: [facility] ENDOSCOPY;  Service: Endoscopy;  Laterality: N/A;   INCISION AND DRAINAGE  12/2009   of thrombosed internal hemorrhoid   rc repair Left 10/14/2018   by Dr. Luiz Blare   TUBAL LIGATION       OB History   No obstetric history on file.     Family History  Problem Relation Age of Onset   Emphysema Father    Lung cancer Father        was a smoker   Heart disease Father     Prostate cancer Father    Asthma Sister    Uterine cancer Mother     Social History   Tobacco Use   Smoking status: Every Day    Packs/day: 0.50    Years: 50.00    Pack years: 25.00    Types: Cigarettes   Smokeless tobacco: Never  Substance Use Topics   Alcohol use: Yes    Comment: BAC .20   Drug use: Yes    Frequency: 7.0 times per week    Types: Marijuana, Cocaine, "Crack" cocaine, Benzodiazepines    Comment: UDS + Cocaine- denies 06/12/18    Home Medications Prior to Admission medications   Medication Sig Start Date End Date Taking? Authorizing Provider  albuterol (PROVENTIL HFA;VENTOLIN HFA) 108 (90 BASE) MCG/ACT inhaler Inhale 2 puffs into the lungs every 6 (six) hours as needed for wheezing. 09/14/12   Ward, Layla Maw, DO  albuterol (PROVENTIL) (2.5 MG/3ML) 0.083% nebulizer solution Take 3 mLs (2.5 mg total) by nebulization every 4 (four) hours as needed for wheezing or shortness of breath. 04/15/14   Samuel Jester, DO  alum & mag hydroxide-simeth (MAALOX PLUS) 400-400-40 MG/5ML suspension Take 10 mLs by mouth every 6 (six) hours as needed for indigestion. 06/07/20   Valinda Hoar, NP  Ascorbic Acid (VITAMIN C  PO) Take 1 tablet by mouth in the morning and at bedtime.     [provider]  BELBUCA 900 MCG FILM Take 1 strip by mouth 2 (two) times daily. 06/30/19   [provider]  diclofenac Sodium (VOLTAREN) 1 % GEL Apply 2 g topically 4 (four) times daily as needed (pain).     [provider]  etodolac (LODINE XL) 400 MG 24 hr tablet Take 400 mg by mouth daily as needed (pain).  06/24/19   [provider]  gabapentin (NEURONTIN) 800 MG tablet Take 800 mg by mouth 4 (four) times daily. 06/25/19   [provider]  Multiple Vitamins-Minerals (ZINC PO) Take 1 tablet by mouth in the morning and at bedtime.    [provider]  ondansetron (ZOFRAN) 4 MG tablet Take 1 tablet (4 mg total) by mouth every 6 (six) hours. 06/07/20   White,  Elita Boone, NP  oxyCODONE (ROXICODONE) 15 MG immediate release tablet Take 15 mg by mouth 5 (five) times daily. 06/28/19   [provider]  polycarbophil (FIBERCON) 625 MG tablet Take 625 mg by mouth daily.    [provider]  pregabalin (LYRICA) 100 MG capsule Take 100 mg by mouth 2 (two) times daily. 05/22/20   [provider]  sulfamethoxazole-trimethoprim (BACTRIM DS) 800-160 MG tablet Take 1 tablet by mouth 2 (two) times daily. 03/21/20   [provider]  tiZANidine (ZANAFLEX) 4 MG tablet Take 4 mg by mouth 3 (three) times daily as needed for muscle spasms.  06/25/19   [provider]  Vitamin D, Ergocalciferol, (DRISDOL) 50000 units CAPS capsule Take 50,000 Units by mouth every Sunday.  11/04/16   [provider]    Allergies    Lorazepam, Advil [ibuprofen], and Vicodin [hydrocodone-acetaminophen]  Review of Systems   Review of Systems  Gastrointestinal:  Positive for constipation.  All other systems reviewed and are negative.  Physical Exam Updated Vital Signs BP (!) 162/94 (BP Location: Right Arm)   Pulse 78   Temp (!) 101 F (38.3 C) (Oral)   Resp 18   SpO2 93%   Physical Exam Vitals and nursing note reviewed.  Constitutional:      General: She is not in acute distress.    Appearance: Normal appearance. She is well-developed.  HENT:     Head: Normocephalic and atraumatic.  Eyes:     Conjunctiva/sclera: Conjunctivae normal.     Pupils: Pupils are equal, round, and reactive to light.  Cardiovascular:     Rate and Rhythm: Normal rate and regular rhythm.     Heart sounds: Normal heart sounds.  Pulmonary:     Effort: Pulmonary effort is normal. No respiratory distress.     Breath sounds: Normal breath sounds.  Abdominal:     General: There is no distension.     Palpations: Abdomen is soft.     Tenderness: There is no abdominal tenderness.  Musculoskeletal:        General: No deformity. Normal range of motion.      Cervical back: Normal range of motion and neck supple.  Skin:    General: Skin is warm and dry.  Neurological:     General: No focal deficit present.     Mental Status: She is alert and oriented to person, place, and time.    ED Results / Procedures / Treatments   Labs (all labs ordered are listed, but only abnormal results are displayed) Labs Reviewed  RESP PANEL BY RT-PCR (FLU  A&B, COVID) ARPGX2  ETHANOL  LIPASE, BLOOD  CBC WITH DIFFERENTIAL/PLATELET  COMPREHENSIVE METABOLIC PANEL  LACTIC ACID, PLASMA  LACTIC ACID, PLASMA  URINALYSIS, ROUTINE W REFLEX MICROSCOPIC    EKG None  Radiology DG Abdomen Acute W/Chest  Result Date: 08/27/2020 CLINICAL DATA:  Abdominal pain, constipation EXAM: DG ABDOMEN ACUTE WITH 1 VIEW CHEST COMPARISON:  None. FINDINGS: Lungs are clear. No pneumothorax or pleural effusion. Cardiac size within normal limits. Pulmonary vascularity is normal. Normal abdominal gas pattern. Small stool within the distal colon. No significant stool noted within the rectal vault. No free intraperitoneal gas. No organomegaly. Involuted fibroid within the pelvis. Multiple phleboliths noted within the pelvis. No acute bone abnormality. IMPRESSION: Normal abdominal gas pattern.  Small stool within the distal colon. Electronically Signed   By: Helyn Numbers M.D.   On: 08/27/2020 19:49    Procedures Procedures   Medications Ordered in ED Medications  sodium phosphate (FLEET) 7-19 GM/118ML enema 1 enema (1 enema Rectal Given 08/27/20 2053)  sodium chloride 0.9 % bolus 500 mL (500 mLs Intravenous New Bag/Given 08/27/20 2037)  acetaminophen (TYLENOL) tablet 1,000 mg (1,000 mg Oral Given 08/27/20 2040)    ED Course  I have reviewed the triage vital signs and the nursing notes.  Pertinent labs & imaging results that were available during my care of the patient were reviewed by me and considered in my medical decision making (see chart for details).    MDM  Rules/Calculators/A&P                           MDM  MSE complete  CORDELLA STINNER was evaluated in Emergency Department on 08/27/2020 for the symptoms described in the history of present illness. She was evaluated in the context of the global COVID-19 pandemic, which necessitated consideration that the patient might be at risk for infection with the SARS-CoV-2 virus that causes COVID-19. Institutional protocols and algorithms that pertain to the evaluation of patients at risk for COVID-19 are in a state of rapid change based on information released by regulatory bodies including the CDC and federal and state organizations. These policies and algorithms were followed during the patient's care in the ED.  Presented with complaint of constipation and vague abdominal discomfort.  Patient was noted to be febrile on arrival.  Patient's constipation was the primary complaint.  Given documented fever will obtain labs and imaging to fully rule out significant intra-abdominal process.  Patient with pending CT abdomen pelvis.  Labs on the whole are reassuring other than positive COVID test.  Suspect that patient's documented fever is secondary to COVID infection.  Patient without URI symptoms such as cough, congestion, or shortness of breath.  Dr. Judd Lien aware of pending CT and disposition.  Final Clinical Impression(s) / ED Diagnoses Final diagnoses:  Constipation, unspecified constipation type  Fever, unspecified fever cause  COVID    Rx / DC Orders ED Discharge Orders     None        Wynetta Fines, MD 08/28/20 2248

## 2020-08-28 DIAGNOSIS — R109 Unspecified abdominal pain: Secondary | ICD-10-CM | POA: Diagnosis not present

## 2020-08-28 DIAGNOSIS — K5900 Constipation, unspecified: Secondary | ICD-10-CM | POA: Diagnosis not present

## 2020-08-28 LAB — URINALYSIS, ROUTINE W REFLEX MICROSCOPIC
Bilirubin Urine: NEGATIVE
Glucose, UA: NEGATIVE mg/dL
Hgb urine dipstick: NEGATIVE
Ketones, ur: NEGATIVE mg/dL
Leukocytes,Ua: NEGATIVE
Nitrite: NEGATIVE
Protein, ur: NEGATIVE mg/dL
Specific Gravity, Urine: 1.005 (ref 1.005–1.030)
pH: 8 (ref 5.0–8.0)

## 2020-08-28 MED ORDER — POLYETHYLENE GLYCOL 3350 17 GM/SCOOP PO POWD: 17.0000 g | Freq: Two times a day (BID) | ORAL | 0 refills | Status: AC

## 2020-08-28 MED ORDER — IOHEXOL 300 MG/ML  SOLN
100.0000 mL | Freq: Once | INTRAMUSCULAR | Status: AC | PRN
  Administered 2020-08-28: 100 mL via INTRAVENOUS

## 2020-08-28 NOTE — ED Notes (Signed)
Called [employer] cab to transport home--Edi Gorniak

## 2020-08-28 NOTE — ED Notes (Signed)
Call placed to cab company for transport home

## 2020-08-28 NOTE — ED Provider Notes (Signed)
  Physical Exam  BP 129/85   Pulse 64   Temp 98.8 F (37.1 C) (Oral)   Resp 16   SpO2 93%   Physical Exam Vitals and nursing note reviewed.  Constitutional:      General: She is not in acute distress.    Appearance: Normal appearance. She is not ill-appearing.  HENT:     Head: Normocephalic and atraumatic.  Pulmonary:     Effort: Pulmonary effort is normal.  Skin:    General: Skin is warm and dry.  Neurological:     Mental Status: She is alert.    ED Course/Procedures     Procedures  MDM  Patient is a 70 year old female signed out to me at shift change.  She originally presented here with constipation and abdominal pain.  Patient's work-up thus far has been unremarkable.  Laboratory studies are reassuring and plain films of the abdomen unremarkable.  I assumed care awaiting results of a CT scan of the abdomen and pelvis.  This has been performed and shows no acute intra-abdominal process.  It does show some nodularity of the duodenum and endoscopy is recommended.  I suspect this is an incidental finding and not related to her presentation.  Patient to be discharged with MiraLAX and follow-up with gastroenterology if not improving in the next few days and to discuss the findings on her CT scan.       Geoffery Lyons, MD 08/28/20 [phone]

## 2020-08-28 NOTE — Discharge Instructions (Addendum)
Begin taking MiraLAX as prescribed.  Follow-up with gastroenterology to discuss the results of your CT scan and schedule endoscopy.  Return to the emergency department if you develop worsening pain, high fever, bloody stool or vomit, or other new and concerning symptoms.

## 2020-08-29 DIAGNOSIS — J439 Emphysema, unspecified: Secondary | ICD-10-CM | POA: Diagnosis not present

## 2020-08-29 DIAGNOSIS — U071 COVID-19: Secondary | ICD-10-CM | POA: Diagnosis not present

## 2020-09-05 DIAGNOSIS — U071 COVID-19: Secondary | ICD-10-CM | POA: Diagnosis not present

## 2020-09-07 DIAGNOSIS — G894 Chronic pain syndrome: Secondary | ICD-10-CM | POA: Diagnosis not present

## 2020-09-18 ENCOUNTER — Other Ambulatory Visit: Payer: Self-pay

## 2020-09-18 ENCOUNTER — Ambulatory Visit
Admission: EM | Admit: 2020-09-18 | Discharge: 2020-09-18 | Disposition: A | Discharge status: Home / Self Care | Payer: Medicare Other | Attending: Urgent Care | Admitting: Urgent Care

## 2020-09-18 ENCOUNTER — Encounter: Payer: Self-pay | Admitting: Emergency Medicine

## 2020-09-18 DIAGNOSIS — R8290 Unspecified abnormal findings in urine: Secondary | ICD-10-CM | POA: Diagnosis not present

## 2020-09-18 DIAGNOSIS — N3000 Acute cystitis without hematuria: Secondary | ICD-10-CM | POA: Diagnosis not present

## 2020-09-18 DIAGNOSIS — M545 Low back pain, unspecified: Secondary | ICD-10-CM

## 2020-09-18 DIAGNOSIS — R350 Frequency of micturition: Secondary | ICD-10-CM | POA: Insufficient documentation

## 2020-09-18 LAB — POCT URINALYSIS DIP (MANUAL ENTRY)
Bilirubin, UA: NEGATIVE
Blood, UA: NEGATIVE
Glucose, UA: NEGATIVE mg/dL
Ketones, POC UA: NEGATIVE mg/dL
Nitrite, UA: NEGATIVE
Protein Ur, POC: NEGATIVE mg/dL
Spec Grav, UA: 1.02 (ref 1.010–1.025)
Urobilinogen, UA: 0.2 E.U./dL
pH, UA: 7 (ref 5.0–8.0)

## 2020-09-18 MED ORDER — CEPHALEXIN 500 MG PO CAPS: 500.0000 mg | ORAL_CAPSULE | Freq: Two times a day (BID) | ORAL | 0 refills | Status: AC

## 2020-09-18 NOTE — Discharge Instructions (Addendum)

## 2020-09-18 NOTE — ED Provider Notes (Signed)
[facility]   MRN: [id number] DOB: [birth year]  Subjective:   Dawn Lane is a 70 y.o. female presenting for 4-day history of acute onset left lower sided, flank tenderness, now having persistent urinary frequency, malodorous urine.  Patient states that she drinks Gatorade, tea regularly.  Does not hydrate well.  She has started to hydrate better in the past couple of days to help with a UTI.  Denies fever, nausea, vomiting, pelvic pain, vaginal discharge, hematuria.  No diarrhea, constipation, bloody stools.  No current facility-administered medications for this encounter.  Current Outpatient Medications:    albuterol (PROVENTIL HFA;VENTOLIN HFA) 108 (90 BASE) MCG/ACT inhaler, Inhale 2 puffs into the lungs every 6 (six) hours as needed for wheezing., Disp: 1 Inhaler, Rfl: 1   albuterol (PROVENTIL) (2.5 MG/3ML) 0.083% nebulizer solution, Take 3 mLs (2.5 mg total) by nebulization every 4 (four) hours as needed for wheezing or shortness of breath., Disp: 75 mL, Rfl: 0   alum & mag hydroxide-simeth (MAALOX PLUS) 400-400-40 MG/5ML suspension, Take 10 mLs by mouth every 6 (six) hours as needed for indigestion., Disp: 355 mL, Rfl: 0   Ascorbic Acid (VITAMIN C PO), Take 1 tablet by mouth in the morning and at bedtime. , Disp: , Rfl:    BELBUCA 900 MCG FILM, Take 1 strip by mouth 2 (two) times daily., Disp: , Rfl:    diclofenac Sodium (VOLTAREN) 1 % GEL, Apply 2 g topically 4 (four) times daily as needed (pain). , Disp: , Rfl:    etodolac (LODINE XL) 400 MG 24 hr tablet, Take 400 mg by mouth daily as needed (pain). , Disp: , Rfl:    gabapentin (NEURONTIN) 800 MG tablet, Take 800 mg by mouth 4 (four) times daily., Disp: , Rfl:    Multiple Vitamins-Minerals (ZINC PO), Take 1 tablet by mouth in the morning and at bedtime., Disp: , Rfl:    ondansetron (ZOFRAN) 4 MG tablet, Take 1 tablet (4 mg total) by mouth every 6 (six) hours., Disp: 12 tablet, Rfl: 0   oxyCODONE (ROXICODONE) 15 MG  immediate release tablet, Take 15 mg by mouth 5 (five) times daily., Disp: , Rfl:    polycarbophil (FIBERCON) 625 MG tablet, Take 625 mg by mouth daily., Disp: , Rfl:    polyethylene glycol powder (GLYCOLAX/MIRALAX) 17 GM/SCOOP powder, Take 17 g by mouth 2 (two) times daily., Disp: 255 g, Rfl: 0   pregabalin (LYRICA) 100 MG capsule, Take 100 mg by mouth 2 (two) times daily., Disp: , Rfl:    sulfamethoxazole-trimethoprim (BACTRIM DS) 800-160 MG tablet, Take 1 tablet by mouth 2 (two) times daily., Disp: , Rfl:    tiZANidine (ZANAFLEX) 4 MG tablet, Take 4 mg by mouth 3 (three) times daily as needed for muscle spasms. , Disp: , Rfl:    Vitamin D, Ergocalciferol, (DRISDOL) 50000 units CAPS capsule, Take 50,000 Units by mouth every Sunday. , Disp: , Rfl: 1   Allergies  Allergen Reactions   Lorazepam Other (See Comments)    Cardiac arrest oral Ativan possibly taken from a friend   Advil [Ibuprofen]     hives   Vicodin [Hydrocodone-Acetaminophen] Swelling    Swelling to feet    Past Medical History:  Diagnosis Date   Anxiety 2011   Arthritis    Asthma    Emphysema    Hemorrhoids    Polysubstance abuse (HCC) 2011   4 day [facility] admission for this. Cocaine, ETOH, MJA,    Tobacco use  Past Surgical History:  Procedure Laterality Date   COLONOSCOPY N/A 08/07/2013   Procedure: COLONOSCOPY;  Surgeon: Iva Boop, MD;  Location: [facility] ENDOSCOPY;  Service: Endoscopy;  Laterality: N/A;   INCISION AND DRAINAGE  12/2009   of thrombosed internal hemorrhoid   rc repair Left 10/14/2018   by Dr. Luiz Blare   TUBAL LIGATION      Family History  Problem Relation Age of Onset   Emphysema Father    Lung cancer Father        was a smoker   Heart disease Father    Prostate cancer Father    Asthma Sister    Uterine cancer Mother     Social History   Tobacco Use   Smoking status: Every Day    Packs/day: 0.50    Years: 50.00    Pack years: 25.00    Types: Cigarettes   Smokeless tobacco:  Never  Substance Use Topics   Alcohol use: Yes    Comment: BAC .20   Drug use: Yes    Frequency: 7.0 times per week    Types: Marijuana, Cocaine, "Crack" cocaine, Benzodiazepines    Comment: UDS + Cocaine- denies 06/12/18    ROS   Objective:   Vitals: BP 132/87 (BP Location: Left Arm)   Pulse 93   Temp 99 F (37.2 C) (Oral)   Ht 5\' 6"  (1.676 m)   Wt 123 lb (55.8 kg)   SpO2 93%   BMI 19.85 kg/m   Physical Exam Constitutional:      General: She is not in acute distress.    Appearance: Normal appearance. She is well-developed. She is not ill-appearing, toxic-appearing or diaphoretic.  HENT:     Head: Normocephalic and atraumatic.     Nose: Nose normal.     Mouth/Throat:     Mouth: Mucous membranes are moist.     Pharynx: Oropharynx is clear.  Eyes:     General: No scleral icterus.       Right eye: No discharge.        Left eye: No discharge.     Extraocular Movements: Extraocular movements intact.     Conjunctiva/sclera: Conjunctivae normal.     Pupils: Pupils are equal, round, and reactive to light.  Cardiovascular:     Rate and Rhythm: Normal rate.  Pulmonary:     Effort: Pulmonary effort is normal.  Abdominal:     General: Bowel sounds are normal. There is no distension.     Palpations: Abdomen is soft. There is no mass.     Tenderness: There is no abdominal tenderness. There is no right CVA tenderness, left CVA tenderness, guarding or rebound.  Musculoskeletal:       Back:  Skin:    General: Skin is warm and dry.  Neurological:     General: No focal deficit present.     Mental Status: She is alert and oriented to person, place, and time.  Psychiatric:        Mood and Affect: Mood normal.        Behavior: Behavior normal.        Thought Content: Thought content normal.        Judgment: Judgment normal.    Results for orders placed or performed during the hospital encounter of 09/18/20 (from the past 24 hour(s))  POCT urinalysis dipstick     Status:  Abnormal   Collection Time: 09/18/20 12:36 PM  Result Value Ref Range   Color, UA yellow yellow  Clarity, UA clear clear   Glucose, UA negative negative mg/dL   Bilirubin, UA negative negative   Ketones, POC UA negative negative mg/dL   Spec Grav, UA 1.914 7.829 - 1.025   Blood, UA negative negative   pH, UA 7.0 5.0 - 8.0   Protein Ur, POC negative negative mg/dL   Urobilinogen, UA 0.2 0.2 or 1.0 E.U./dL   Nitrite, UA Negative Negative   Leukocytes, UA Trace (A) Negative    Assessment and Plan :   PDMP not reviewed this encounter.  1. Acute cystitis without hematuria   2. Urinary frequency   3. Malodorous urine   4. Acute left-sided low back pain without sciatica     Start Keflex to cover for acute cystitis, urine culture pending.  Recommended aggressive hydration, limiting urinary irritants. Recommend supportive care otherwise including Tylenol for musculoskeletal back pain.  Counseled patient on potential for adverse effects with medications prescribed/recommended today, ER and return-to-clinic precautions discussed, patient verbalized understanding.    Wallis Bamberg, PA-C 09/18/20 1255

## 2020-09-18 NOTE — ED Triage Notes (Signed)
Patient c/o left sided flank pain x 4 days.  Patient did urinate x 14 last night.  Odorous urine.

## 2020-09-18 NOTE — ED Notes (Signed)
Attempted to contact patient, ready for patient.  No answer, left message.

## 2020-09-19 DIAGNOSIS — M25562 Pain in left knee: Secondary | ICD-10-CM | POA: Diagnosis not present

## 2020-09-19 DIAGNOSIS — F17210 Nicotine dependence, cigarettes, uncomplicated: Secondary | ICD-10-CM | POA: Diagnosis not present

## 2020-09-19 DIAGNOSIS — Z79899 Other long term (current) drug therapy: Secondary | ICD-10-CM | POA: Diagnosis not present

## 2020-09-19 DIAGNOSIS — M25552 Pain in left hip: Secondary | ICD-10-CM | POA: Diagnosis not present

## 2020-09-19 DIAGNOSIS — M545 Low back pain, unspecified: Secondary | ICD-10-CM | POA: Diagnosis not present

## 2020-09-19 DIAGNOSIS — Z9181 History of falling: Secondary | ICD-10-CM | POA: Diagnosis not present

## 2020-09-19 DIAGNOSIS — M25561 Pain in right knee: Secondary | ICD-10-CM | POA: Diagnosis not present

## 2020-09-19 DIAGNOSIS — Z681 Body mass index (BMI) 19 or less, adult: Secondary | ICD-10-CM | POA: Diagnosis not present

## 2020-09-19 LAB — URINE CULTURE: Culture: NO GROWTH

## 2020-09-21 DIAGNOSIS — Z79899 Other long term (current) drug therapy: Secondary | ICD-10-CM | POA: Diagnosis not present

## 2020-09-29 ENCOUNTER — Encounter (HOSPITAL_COMMUNITY): Payer: Self-pay | Admitting: Radiology

## 2020-10-04 DIAGNOSIS — Z9181 History of falling: Secondary | ICD-10-CM | POA: Diagnosis not present

## 2020-10-04 DIAGNOSIS — M129 Arthropathy, unspecified: Secondary | ICD-10-CM | POA: Diagnosis not present

## 2020-10-04 DIAGNOSIS — E7800 Pure hypercholesterolemia, unspecified: Secondary | ICD-10-CM | POA: Diagnosis not present

## 2020-10-04 DIAGNOSIS — F17210 Nicotine dependence, cigarettes, uncomplicated: Secondary | ICD-10-CM | POA: Diagnosis not present

## 2020-10-04 DIAGNOSIS — R7303 Prediabetes: Secondary | ICD-10-CM | POA: Diagnosis not present

## 2020-10-04 DIAGNOSIS — R0602 Shortness of breath: Secondary | ICD-10-CM | POA: Diagnosis not present

## 2020-10-04 DIAGNOSIS — R5383 Other fatigue: Secondary | ICD-10-CM | POA: Diagnosis not present

## 2020-10-04 DIAGNOSIS — J439 Emphysema, unspecified: Secondary | ICD-10-CM | POA: Diagnosis not present

## 2020-10-04 DIAGNOSIS — Z87891 Personal history of nicotine dependence: Secondary | ICD-10-CM | POA: Diagnosis not present

## 2020-10-04 DIAGNOSIS — F32A Depression, unspecified: Secondary | ICD-10-CM | POA: Diagnosis not present

## 2020-10-04 DIAGNOSIS — Z79899 Other long term (current) drug therapy: Secondary | ICD-10-CM | POA: Diagnosis not present

## 2020-10-09 DIAGNOSIS — G894 Chronic pain syndrome: Secondary | ICD-10-CM | POA: Diagnosis not present

## 2020-10-09 DIAGNOSIS — J449 Chronic obstructive pulmonary disease, unspecified: Secondary | ICD-10-CM | POA: Diagnosis not present

## 2020-10-16 ENCOUNTER — Other Ambulatory Visit: Payer: Self-pay | Admitting: Nurse Practitioner

## 2020-10-16 DIAGNOSIS — Z1231 Encounter for screening mammogram for malignant neoplasm of breast: Secondary | ICD-10-CM

## 2020-10-17 DIAGNOSIS — Z681 Body mass index (BMI) 19 or less, adult: Secondary | ICD-10-CM | POA: Diagnosis not present

## 2020-10-17 DIAGNOSIS — M545 Low back pain, unspecified: Secondary | ICD-10-CM | POA: Diagnosis not present

## 2020-10-17 DIAGNOSIS — M25561 Pain in right knee: Secondary | ICD-10-CM | POA: Diagnosis not present

## 2020-10-17 DIAGNOSIS — F17210 Nicotine dependence, cigarettes, uncomplicated: Secondary | ICD-10-CM | POA: Diagnosis not present

## 2020-10-17 DIAGNOSIS — M25552 Pain in left hip: Secondary | ICD-10-CM | POA: Diagnosis not present

## 2020-10-17 DIAGNOSIS — M25562 Pain in left knee: Secondary | ICD-10-CM | POA: Diagnosis not present

## 2020-10-17 DIAGNOSIS — Z79899 Other long term (current) drug therapy: Secondary | ICD-10-CM | POA: Diagnosis not present

## 2020-10-17 DIAGNOSIS — Z9181 History of falling: Secondary | ICD-10-CM | POA: Diagnosis not present

## 2020-10-23 ENCOUNTER — Encounter (HOSPITAL_COMMUNITY): Payer: Self-pay

## 2020-10-23 ENCOUNTER — Emergency Department (HOSPITAL_COMMUNITY): Payer: Medicare Other

## 2020-10-23 ENCOUNTER — Other Ambulatory Visit: Payer: Self-pay

## 2020-10-23 ENCOUNTER — Emergency Department (HOSPITAL_COMMUNITY)
Admission: EM | Admit: 2020-10-23 | Discharge: 2020-10-24 | Disposition: A | Discharge status: Home / Self Care | Payer: Medicare Other | Attending: Emergency Medicine | Admitting: Emergency Medicine

## 2020-10-23 DIAGNOSIS — R6889 Other general symptoms and signs: Secondary | ICD-10-CM | POA: Diagnosis not present

## 2020-10-23 DIAGNOSIS — R0602 Shortness of breath: Secondary | ICD-10-CM | POA: Diagnosis not present

## 2020-10-23 DIAGNOSIS — Z743 Need for continuous supervision: Secondary | ICD-10-CM | POA: Diagnosis not present

## 2020-10-23 DIAGNOSIS — Z5321 Procedure and treatment not carried out due to patient leaving prior to being seen by health care provider: Secondary | ICD-10-CM | POA: Insufficient documentation

## 2020-10-23 DIAGNOSIS — R059 Cough, unspecified: Secondary | ICD-10-CM | POA: Diagnosis not present

## 2020-10-23 MED ORDER — ALBUTEROL SULFATE HFA 108 (90 BASE) MCG/ACT IN AERS: 2.0000 | INHALATION_SPRAY | RESPIRATORY_TRACT | Status: DC | PRN

## 2020-10-23 NOTE — ED Notes (Addendum)
Pt refused blood work. Stated if she is going to end up getting an IV  she did not want to be stuck and also told his writer that I would not be allowed to stick her twice due to her fear of needles. When pt placed in the waiting room she stated "oh no I'm not waiting out here, if yall arent going to give me treatment, I will go home take my pain medicine and watch the football game." Triage Nurse Sheria Lang, RN and EDP-PA [facility] made aware

## 2020-10-23 NOTE — ED Provider Notes (Signed)
Emergency Medicine Provider Triage Evaluation Note  Dawn Lane , a 70 y.o. female  was evaluated in triage.  Pt complains of shortness of breath for the past few days.  Review of Systems  Positive: Shortness of breath, rib pain Negative: Chest pain  Physical Exam  BP 134/87   Pulse 71   Temp 97.9 F (36.6 C)   Resp 18   SpO2 93%  Gen:   Awake, no distress   Resp:  Normal effort  MSK:   Moves extremities without difficulty  Other:  Clear to auscultation bilaterally.  No wheezes crackles or rales.  Decreased sounds on the right lower lobe  Medical Decision Making  Medically screening exam initiated at 8:04 PM.  Appropriate orders placed.  Dawn Lane was informed that the remainder of the evaluation will be completed by another provider, this initial triage assessment does not replace that evaluation, and the importance of remaining in the ED until their evaluation is complete.     Woodroe Chen 10/23/20 Virgina Evener, MD 10/25/20 1455

## 2020-10-23 NOTE — ED Triage Notes (Signed)
Patient BIB [facility] from home. Shortness of breath for 4 days. Diminished lung sounds in the right lower quadrant. 2L Allisonia put on patient she felt better. History of COPD, asthma.

## 2020-10-24 DIAGNOSIS — J439 Emphysema, unspecified: Secondary | ICD-10-CM | POA: Diagnosis not present

## 2020-10-26 DIAGNOSIS — J449 Chronic obstructive pulmonary disease, unspecified: Secondary | ICD-10-CM | POA: Diagnosis not present

## 2020-10-26 DIAGNOSIS — F17210 Nicotine dependence, cigarettes, uncomplicated: Secondary | ICD-10-CM | POA: Diagnosis not present

## 2020-10-26 DIAGNOSIS — Z6820 Body mass index (BMI) 20.0-20.9, adult: Secondary | ICD-10-CM | POA: Diagnosis not present

## 2020-11-07 DIAGNOSIS — G894 Chronic pain syndrome: Secondary | ICD-10-CM | POA: Diagnosis not present

## 2020-11-16 DIAGNOSIS — M65311 Trigger thumb, right thumb: Secondary | ICD-10-CM | POA: Diagnosis not present

## 2020-11-16 DIAGNOSIS — M1811 Unilateral primary osteoarthritis of first carpometacarpal joint, right hand: Secondary | ICD-10-CM | POA: Diagnosis not present

## 2020-11-17 DIAGNOSIS — F17210 Nicotine dependence, cigarettes, uncomplicated: Secondary | ICD-10-CM | POA: Diagnosis not present

## 2020-11-17 DIAGNOSIS — M25552 Pain in left hip: Secondary | ICD-10-CM | POA: Diagnosis not present

## 2020-11-17 DIAGNOSIS — M25562 Pain in left knee: Secondary | ICD-10-CM | POA: Diagnosis not present

## 2020-11-17 DIAGNOSIS — M545 Low back pain, unspecified: Secondary | ICD-10-CM | POA: Diagnosis not present

## 2020-11-17 DIAGNOSIS — Z79899 Other long term (current) drug therapy: Secondary | ICD-10-CM | POA: Diagnosis not present

## 2020-11-17 DIAGNOSIS — Z9181 History of falling: Secondary | ICD-10-CM | POA: Diagnosis not present

## 2020-11-17 DIAGNOSIS — Z6820 Body mass index (BMI) 20.0-20.9, adult: Secondary | ICD-10-CM | POA: Diagnosis not present

## 2020-11-17 DIAGNOSIS — R030 Elevated blood-pressure reading, without diagnosis of hypertension: Secondary | ICD-10-CM | POA: Diagnosis not present

## 2020-11-17 DIAGNOSIS — M25561 Pain in right knee: Secondary | ICD-10-CM | POA: Diagnosis not present

## 2020-11-23 DIAGNOSIS — Z79899 Other long term (current) drug therapy: Secondary | ICD-10-CM | POA: Diagnosis not present

## 2020-11-24 DIAGNOSIS — J439 Emphysema, unspecified: Secondary | ICD-10-CM | POA: Diagnosis not present

## 2020-12-18 ENCOUNTER — Emergency Department (HOSPITAL_COMMUNITY): Payer: Medicare Other

## 2020-12-18 ENCOUNTER — Emergency Department (HOSPITAL_COMMUNITY)
Admission: EM | Admit: 2020-12-18 | Discharge: 2020-12-18 | Disposition: A | Discharge status: Home / Self Care | Payer: Medicare Other | Attending: Emergency Medicine | Admitting: Emergency Medicine

## 2020-12-18 ENCOUNTER — Other Ambulatory Visit: Payer: Self-pay

## 2020-12-18 DIAGNOSIS — R140 Abdominal distension (gaseous): Secondary | ICD-10-CM | POA: Insufficient documentation

## 2020-12-18 DIAGNOSIS — Z20822 Contact with and (suspected) exposure to covid-19: Secondary | ICD-10-CM | POA: Diagnosis not present

## 2020-12-18 DIAGNOSIS — Z743 Need for continuous supervision: Secondary | ICD-10-CM | POA: Diagnosis not present

## 2020-12-18 DIAGNOSIS — R404 Transient alteration of awareness: Secondary | ICD-10-CM | POA: Diagnosis not present

## 2020-12-18 DIAGNOSIS — J45909 Unspecified asthma, uncomplicated: Secondary | ICD-10-CM | POA: Insufficient documentation

## 2020-12-18 DIAGNOSIS — R197 Diarrhea, unspecified: Secondary | ICD-10-CM | POA: Insufficient documentation

## 2020-12-18 DIAGNOSIS — R059 Cough, unspecified: Secondary | ICD-10-CM | POA: Diagnosis not present

## 2020-12-18 DIAGNOSIS — F17210 Nicotine dependence, cigarettes, uncomplicated: Secondary | ICD-10-CM | POA: Insufficient documentation

## 2020-12-18 DIAGNOSIS — Z8616 Personal history of COVID-19: Secondary | ICD-10-CM | POA: Insufficient documentation

## 2020-12-18 DIAGNOSIS — I499 Cardiac arrhythmia, unspecified: Secondary | ICD-10-CM | POA: Diagnosis not present

## 2020-12-18 DIAGNOSIS — J449 Chronic obstructive pulmonary disease, unspecified: Secondary | ICD-10-CM | POA: Insufficient documentation

## 2020-12-18 DIAGNOSIS — R109 Unspecified abdominal pain: Secondary | ICD-10-CM | POA: Diagnosis not present

## 2020-12-18 DIAGNOSIS — R112 Nausea with vomiting, unspecified: Secondary | ICD-10-CM

## 2020-12-18 DIAGNOSIS — R1084 Generalized abdominal pain: Secondary | ICD-10-CM | POA: Diagnosis not present

## 2020-12-18 DIAGNOSIS — I517 Cardiomegaly: Secondary | ICD-10-CM | POA: Diagnosis not present

## 2020-12-18 DIAGNOSIS — R6889 Other general symptoms and signs: Secondary | ICD-10-CM | POA: Diagnosis not present

## 2020-12-18 LAB — COMPREHENSIVE METABOLIC PANEL
ALT: 17 U/L (ref 0–44)
AST: 28 U/L (ref 15–41)
Albumin: 4 g/dL (ref 3.5–5.0)
Alkaline Phosphatase: 55 U/L (ref 38–126)
Anion gap: 13 (ref 5–15)
BUN: 11 mg/dL (ref 8–23)
CO2: 26 mmol/L (ref 22–32)
Calcium: 9.3 mg/dL (ref 8.9–10.3)
Chloride: 101 mmol/L (ref 98–111)
Creatinine, Ser: 0.87 mg/dL (ref 0.44–1.00)
GFR, Estimated: >60 mL/min (ref >60)
Glucose, Bld: 140 mg/dL — ABNORMAL HIGH (ref 70–99)
Potassium: 3.5 mmol/L (ref 3.5–5.1)
Sodium: 140 mmol/L (ref 135–145)
Total Bilirubin: 1.1 mg/dL (ref 0.3–1.2)
Total Protein: 6.9 g/dL (ref 6.5–8.1)

## 2020-12-18 LAB — CBC WITH DIFFERENTIAL/PLATELET
Abs Immature Granulocytes: 0.02 10*3/uL (ref 0.00–0.07)
Basophils Absolute: 0 10*3/uL (ref 0.0–0.1)
Basophils Relative: 0 %
Eosinophils Absolute: 0 10*3/uL (ref 0.0–0.5)
Eosinophils Relative: 0 %
HCT: 49.6 % — ABNORMAL HIGH (ref 36.0–46.0)
Hemoglobin: 16.1 g/dL — ABNORMAL HIGH (ref 12.0–15.0)
Immature Granulocytes: 0 %
Lymphocytes Relative: 13 %
Lymphs Abs: 0.9 10*3/uL (ref 0.7–4.0)
MCH: 27.7 pg (ref 26.0–34.0)
MCHC: 32.5 g/dL (ref 30.0–36.0)
MCV: 85.2 fL (ref 80.0–100.0)
Monocytes Absolute: 0.4 10*3/uL (ref 0.1–1.0)
Monocytes Relative: 6 %
Neutro Abs: 5.9 10*3/uL (ref 1.7–7.7)
Neutrophils Relative %: 81 %
Platelets: 279 10*3/uL (ref 150–400)
RBC: 5.82 MIL/uL — ABNORMAL HIGH (ref 3.87–5.11)
RDW: 14.1 % (ref 11.5–15.5)
WBC: 7.3 10*3/uL (ref 4.0–10.5)
nRBC: 0 % (ref 0.0–0.2)

## 2020-12-18 LAB — RESP PANEL BY RT-PCR (FLU A&B, COVID) ARPGX2
Influenza A by PCR: NEGATIVE
Influenza B by PCR: NEGATIVE
SARS Coronavirus 2 by RT PCR: NEGATIVE

## 2020-12-18 LAB — TROPONIN I (HIGH SENSITIVITY)
Troponin I (High Sensitivity): 9 ng/L (ref <18)
Troponin I (High Sensitivity): 11 ng/L (ref <18)

## 2020-12-18 LAB — LIPASE, BLOOD: Lipase: 25 U/L (ref 11–51)

## 2020-12-18 MED ORDER — ONDANSETRON HCL 4 MG/2ML IJ SOLN
4.0000 mg | Freq: Once | INTRAMUSCULAR | Status: AC
  Administered 2020-12-18: 4 mg via INTRAVENOUS
  Filled 2020-12-18: qty 2

## 2020-12-18 MED ORDER — ONDANSETRON 4 MG PO TBDP
8.0000 mg | ORAL_TABLET | Freq: Once | ORAL | Status: AC
  Administered 2020-12-18: 8 mg via ORAL
  Filled 2020-12-18: qty 2

## 2020-12-18 MED ORDER — SODIUM CHLORIDE 0.9 % IV BOLUS
1000.0000 mL | Freq: Once | INTRAVENOUS | Status: AC
  Administered 2020-12-18: 1000 mL via INTRAVENOUS

## 2020-12-18 NOTE — ED Notes (Signed)
IV team at pt bedside 

## 2020-12-18 NOTE — ED Notes (Signed)
Warm blankets placed over pt °

## 2020-12-18 NOTE — ED Provider Notes (Signed)
Patient signed out to me by previous provider. Please refer to their note for full HPI.  Briefly this is a 70 year old female who presented to the emergency department with nausea/vomiting/diarrhea, began the night before arrival.  Vital stable and abdomen benign per previous physician.  Blood work reassuring and normal.  Plan for symptomatic treatment and reevaluation.  No indication for emergent imaging at this time, recent CT of the abdomen pelvis with similar complaints was normal. Physical Exam  BP (!) 173/106   Pulse (!) 56   Temp 98.4 F (36.9 C) (Oral)   Resp 18   SpO2 95%   Physical Exam Vitals and nursing note reviewed.  Constitutional:      General: She is not in acute distress.    Appearance: Normal appearance.  HENT:     Head: Normocephalic.     Mouth/Throat:     Mouth: Mucous membranes are moist.  Cardiovascular:     Rate and Rhythm: Normal rate.  Pulmonary:     Effort: Pulmonary effort is normal. No respiratory distress.  Abdominal:     Palpations: Abdomen is soft.     Tenderness: There is no abdominal tenderness. There is no guarding or rebound.  Skin:    General: Skin is warm.  Neurological:     Mental Status: She is alert and oriented to person, place, and time. Mental status is at baseline.  Psychiatric:        Mood and Affect: Mood normal.    ED Course/Procedures     Procedures  MDM   After medications patient feels improved, has been able to p.o., has a doctor's appointment in a couple hours and is requesting to be discharged.  Vitals remain normal, abdomen remains benign.  No indication for further emergent imaging/treatment or evaluation.  Patient cleared for discharge.  Patient at this time appears safe and stable for discharge and will be treated as an outpatient.  Discharge plan and strict return to ED precautions discussed, patient verbalizes understanding and agreement.       Rozelle Logan, DO 12/18/20 1017

## 2020-12-18 NOTE — ED Notes (Signed)
Pt verbalizes understanding of discharge instructions. Opportunity for questions and answers were provided. Pt discharged from the ED.   ?

## 2020-12-18 NOTE — ED Notes (Signed)
Hospital socks placed on pt's feet per pt request.

## 2020-12-18 NOTE — ED Notes (Signed)
This RN offered pt bedside commode after pt stated that she needed a bedpan for bowel movement. Pt now stating that she does not need to use it anymore and has not become incontinent. Will offer bedside commode again if pt states a need.

## 2020-12-18 NOTE — ED Triage Notes (Signed)
Pt biba from home with complaints of N/V/D that started at 8pm last night. Medics state pt had episodes of bradycardia in route

## 2020-12-18 NOTE — Discharge Instructions (Signed)
You have been seen and discharged from the emergency department.  Your blood work was normal for you and reassuring.  Follow-up with your primary provider for reevaluation and further care. Take home medications as prescribed. If you have any worsening symptoms or further concerns for your health please return to an emergency department for further evaluation.

## 2020-12-18 NOTE — ED Provider Notes (Signed)
[facility] EMERGENCY DEPARTMENT Provider Note   CSN: [id number] Arrival date & time: 12/18/20  0330     History Chief Complaint  Patient presents with   Emesis    Since 2000, episodes of bradycardia in route per EMS    Dawn Lane is a 70 y.o. female.  Patient presents to the emergency department with nausea, vomiting and diarrhea.  Patient reports that symptoms began around 8 PM.  Patient reports that she has had persistent nausea, vomiting and watery diarrhea.  She is experiencing diffuse abdominal pain with some mild distention.  She has had a cough but no fever.      Past Medical History:  Diagnosis Date   Anxiety 2011   Arthritis    Asthma    Emphysema    Hemorrhoids    Polysubstance abuse ([place]) 2011   4 day [facility] admission for this. Cocaine, ETOH, MJA,    Tobacco use     Patient Active Problem List   Diagnosis Date Noted   COPD active smoker/ pfts pending 01/04/2016   Colitis 08/04/2013   Abdominal pain 08/04/2013   Nausea, vomiting and diarrhea 08/04/2013   Cigarette smoker 08/04/2013   Dyspnea 08/02/2010    Past Surgical History:  Procedure Laterality Date   COLONOSCOPY N/A 08/07/2013   Procedure: COLONOSCOPY;  Surgeon: Gatha Mayer, MD;  Location: [facility];  Service: Endoscopy;  Laterality: N/A;   INCISION AND DRAINAGE  12/2009   of thrombosed internal hemorrhoid   rc repair Left 10/14/2018   by Dr. Berenice Primas   TUBAL LIGATION       OB History   No obstetric history on file.     Family History  Problem Relation Age of Onset   Emphysema Father    Lung cancer Father        was a smoker   Heart disease Father    Prostate cancer Father    Asthma Sister    Uterine cancer Mother     Social History   Tobacco Use   Smoking status: Every Day    Packs/day: 0.50    Years: 50.00    Pack years: 25.00    Types: Cigarettes   Smokeless tobacco: Never  Substance Use Topics   Alcohol use: Yes    Comment: BAC .20    Drug use: Yes    Frequency: 7.0 times per week    Types: Marijuana, Cocaine, "Crack" cocaine, Benzodiazepines    Comment: UDS + Cocaine- denies 06/12/18    Home Medications Prior to Admission medications   Medication Sig Start Date End Date Taking? Authorizing Provider  albuterol (PROVENTIL HFA;VENTOLIN HFA) 108 (90 BASE) MCG/ACT inhaler Inhale 2 puffs into the lungs every 6 (six) hours as needed for wheezing. 09/14/12   Ward, Delice Bison, DO  albuterol (PROVENTIL) (2.5 MG/3ML) 0.083% nebulizer solution Take 3 mLs (2.5 mg total) by nebulization every 4 (four) hours as needed for wheezing or shortness of breath. 04/15/14   Francine Graven, DO  alum & mag hydroxide-simeth (MAALOX PLUS) 400-400-40 MG/5ML suspension Take 10 mLs by mouth every 6 (six) hours as needed for indigestion. 06/07/20   Hans Eden, NP  Ascorbic Acid (VITAMIN C PO) Take 1 tablet by mouth in the morning and at bedtime.     [provider]  BELBUCA 900 MCG FILM Take 1 strip by mouth 2 (two) times daily. 06/30/19   [provider]  cephALEXin (KEFLEX) 500 MG capsule Take 1 capsule (500  mg total) by mouth 2 (two) times daily. 09/18/20   Wallis Bamberg, PA-C  diclofenac Sodium (VOLTAREN) 1 % GEL Apply 2 g topically 4 (four) times daily as needed (pain).     [provider]  etodolac (LODINE XL) 400 MG 24 hr tablet Take 400 mg by mouth daily as needed (pain).  06/24/19   [provider]  gabapentin (NEURONTIN) 800 MG tablet Take 800 mg by mouth 4 (four) times daily. 06/25/19   [provider]  Multiple Vitamins-Minerals (ZINC PO) Take 1 tablet by mouth in the morning and at bedtime.    [provider]  ondansetron (ZOFRAN) 4 MG tablet Take 1 tablet (4 mg total) by mouth every 6 (six) hours. 06/07/20   White, Elita Boone, NP  oxyCODONE (ROXICODONE) 15 MG immediate release tablet Take 15 mg by mouth 5 (five) times daily. 06/28/19   [provider]  polycarbophil (FIBERCON) 625 MG  tablet Take 625 mg by mouth daily.    [provider]  polyethylene glycol powder (GLYCOLAX/MIRALAX) 17 GM/SCOOP powder Take 17 g by mouth 2 (two) times daily. 08/28/20   Geoffery Lyons, MD  pregabalin (LYRICA) 100 MG capsule Take 100 mg by mouth 2 (two) times daily. 05/22/20   [provider]  sulfamethoxazole-trimethoprim (BACTRIM DS) 800-160 MG tablet Take 1 tablet by mouth 2 (two) times daily. 03/21/20   [provider]  tiZANidine (ZANAFLEX) 4 MG tablet Take 4 mg by mouth 3 (three) times daily as needed for muscle spasms.  06/25/19   [provider]  Vitamin D, Ergocalciferol, (DRISDOL) 50000 units CAPS capsule Take 50,000 Units by mouth every Sunday.  11/04/16   [provider]    Allergies    Lorazepam, Advil [ibuprofen], and Vicodin [hydrocodone-acetaminophen]  Review of Systems   Review of Systems  Respiratory:  Positive for cough.   Gastrointestinal:  Positive for abdominal distention, diarrhea, nausea and vomiting.  All other systems reviewed and are negative.  Physical Exam Updated Vital Signs BP (!) 173/106   Pulse (!) 56   Temp 98.4 F (36.9 C) (Oral)   Resp 18   SpO2 95%   Physical Exam Vitals and nursing note reviewed.  Constitutional:      General: She is not in acute distress.    Appearance: Normal appearance. She is well-developed.  HENT:     Head: Normocephalic and atraumatic.     Right Ear: Hearing normal.     Left Ear: Hearing normal.     Nose: Nose normal.  Eyes:     Conjunctiva/sclera: Conjunctivae normal.     Pupils: Pupils are equal, round, and reactive to light.  Cardiovascular:     Rate and Rhythm: Regular rhythm.     Heart sounds: S1 normal and S2 normal. No murmur heard.   No friction rub. No gallop.  Pulmonary:     Effort: Pulmonary effort is normal. No respiratory distress.     Breath sounds: Normal breath sounds.  Chest:     Chest wall: No tenderness.  Abdominal:     General: Bowel sounds are  normal.     Palpations: Abdomen is soft.     Tenderness: There is abdominal tenderness. There is no guarding or rebound. Negative signs include Murphy's sign and McBurney's sign.     Hernia: No hernia is present.  Musculoskeletal:        General: Normal range of motion.     Cervical back: Normal range of motion and neck supple.  Skin:    General: Skin is warm and dry.     Findings: No rash.  Neurological:     Mental Status: She is alert and oriented to person, place, and time.     GCS: GCS eye subscore is 4. GCS verbal subscore is 5. GCS motor subscore is 6.     Cranial Nerves: No cranial nerve deficit.     Sensory: No sensory deficit.     Coordination: Coordination normal.  Psychiatric:        Speech: Speech normal.        Behavior: Behavior normal.        Thought Content: Thought content normal.    ED Results / Procedures / Treatments   Labs (all labs ordered are listed, but only abnormal results are displayed) Labs Reviewed  CBC WITH DIFFERENTIAL/PLATELET - Abnormal; Notable for the following components:      Result Value   RBC 5.82 (*)    Hemoglobin 16.1 (*)    HCT 49.6 (*)    All other components within normal limits  COMPREHENSIVE METABOLIC PANEL - Abnormal; Notable for the following components:   Glucose, Bld 140 (*)    All other components within normal limits  RESP PANEL BY RT-PCR (FLU A&B, COVID) ARPGX2  LIPASE, BLOOD  TROPONIN I (HIGH SENSITIVITY)  TROPONIN I (HIGH SENSITIVITY)    EKG None  Radiology DG Chest Port 1 View  Result Date: 12/18/2020 CLINICAL DATA:  Cough. EXAM: PORTABLE CHEST 1 VIEW COMPARISON:  11/23/2020. FINDINGS: The heart is enlarged and the mediastinal contours are stable. There is hyperinflation of the lungs. No consolidation, effusion, or pneumothorax is seen. No acute osseous abnormality. IMPRESSION: Cardiomegaly with no acute process. Electronically Signed   By: Brett Fairy M.D.   On: 12/18/2020 04:07    Procedures Procedures    Medications Ordered in ED Medications  ondansetron (ZOFRAN-ODT) disintegrating tablet 8 mg (8 mg Oral Given 12/18/20 0625)  ondansetron (ZOFRAN) injection 4 mg (4 mg Intravenous Given 12/18/20 0714)  sodium chloride 0.9 % bolus 1,000 mL (0 mLs Intravenous Stopped 12/18/20 0815)    ED Course  I have reviewed the triage vital signs and the nursing notes.  Pertinent labs & imaging results that were available during my care of the patient were reviewed by me and considered in my medical decision making (see chart for details).    MDM Rules/Calculators/A&P                           Patient presented to the emergency department for evaluation of abdominal pain with nausea, vomiting and diarrhea.  She was treated with IV fluids and Zofran.  Patient with basic work-up pending.  She has had prior presentation with similar complaints, CT scan at that time was negative.  Will treat symptomatically, signout to oncoming ER physician to follow-up. Final Clinical Impression(s) / ED Diagnoses Final diagnoses:  Nausea and vomiting, unspecified vomiting type    Rx / DC Orders ED Discharge Orders     None        Orpah Greek, MD 12/21/20 1202

## 2020-12-19 DIAGNOSIS — F17210 Nicotine dependence, cigarettes, uncomplicated: Secondary | ICD-10-CM | POA: Diagnosis not present

## 2020-12-19 DIAGNOSIS — Z6820 Body mass index (BMI) 20.0-20.9, adult: Secondary | ICD-10-CM | POA: Diagnosis not present

## 2020-12-19 DIAGNOSIS — M25552 Pain in left hip: Secondary | ICD-10-CM | POA: Diagnosis not present

## 2020-12-19 DIAGNOSIS — Z9181 History of falling: Secondary | ICD-10-CM | POA: Diagnosis not present

## 2020-12-19 DIAGNOSIS — M25561 Pain in right knee: Secondary | ICD-10-CM | POA: Diagnosis not present

## 2020-12-19 DIAGNOSIS — M25562 Pain in left knee: Secondary | ICD-10-CM | POA: Diagnosis not present

## 2020-12-19 DIAGNOSIS — Z79899 Other long term (current) drug therapy: Secondary | ICD-10-CM | POA: Diagnosis not present

## 2020-12-19 DIAGNOSIS — M545 Low back pain, unspecified: Secondary | ICD-10-CM | POA: Diagnosis not present

## 2020-12-22 DIAGNOSIS — Z79899 Other long term (current) drug therapy: Secondary | ICD-10-CM | POA: Diagnosis not present

## 2020-12-24 DIAGNOSIS — J439 Emphysema, unspecified: Secondary | ICD-10-CM | POA: Diagnosis not present

## 2021-01-15 DIAGNOSIS — F17210 Nicotine dependence, cigarettes, uncomplicated: Secondary | ICD-10-CM | POA: Diagnosis not present

## 2021-01-15 DIAGNOSIS — M25512 Pain in left shoulder: Secondary | ICD-10-CM | POA: Diagnosis not present

## 2021-01-15 DIAGNOSIS — Z131 Encounter for screening for diabetes mellitus: Secondary | ICD-10-CM | POA: Diagnosis not present

## 2021-01-15 DIAGNOSIS — Z0000 Encounter for general adult medical examination without abnormal findings: Secondary | ICD-10-CM | POA: Diagnosis not present

## 2021-01-15 DIAGNOSIS — M545 Low back pain, unspecified: Secondary | ICD-10-CM | POA: Diagnosis not present

## 2021-01-15 DIAGNOSIS — M25552 Pain in left hip: Secondary | ICD-10-CM | POA: Diagnosis not present

## 2021-01-15 DIAGNOSIS — M25511 Pain in right shoulder: Secondary | ICD-10-CM | POA: Diagnosis not present

## 2021-01-15 DIAGNOSIS — Z79899 Other long term (current) drug therapy: Secondary | ICD-10-CM | POA: Diagnosis not present

## 2021-01-15 DIAGNOSIS — Z9181 History of falling: Secondary | ICD-10-CM | POA: Diagnosis not present

## 2021-01-17 DIAGNOSIS — Z79899 Other long term (current) drug therapy: Secondary | ICD-10-CM | POA: Diagnosis not present

## 2021-01-19 DIAGNOSIS — G894 Chronic pain syndrome: Secondary | ICD-10-CM | POA: Diagnosis not present

## 2021-01-24 DIAGNOSIS — J439 Emphysema, unspecified: Secondary | ICD-10-CM | POA: Diagnosis not present

## 2021-02-16 DIAGNOSIS — Z79899 Other long term (current) drug therapy: Secondary | ICD-10-CM | POA: Diagnosis not present

## 2021-02-16 DIAGNOSIS — M25552 Pain in left hip: Secondary | ICD-10-CM | POA: Diagnosis not present

## 2021-02-16 DIAGNOSIS — F17210 Nicotine dependence, cigarettes, uncomplicated: Secondary | ICD-10-CM | POA: Diagnosis not present

## 2021-02-16 DIAGNOSIS — M545 Low back pain, unspecified: Secondary | ICD-10-CM | POA: Diagnosis not present

## 2021-02-16 DIAGNOSIS — Z9181 History of falling: Secondary | ICD-10-CM | POA: Diagnosis not present

## 2021-02-16 DIAGNOSIS — M25512 Pain in left shoulder: Secondary | ICD-10-CM | POA: Diagnosis not present

## 2021-02-20 DIAGNOSIS — Z79899 Other long term (current) drug therapy: Secondary | ICD-10-CM | POA: Diagnosis not present

## 2021-02-20 DIAGNOSIS — G894 Chronic pain syndrome: Secondary | ICD-10-CM | POA: Diagnosis not present

## 2021-02-24 DIAGNOSIS — J439 Emphysema, unspecified: Secondary | ICD-10-CM | POA: Diagnosis not present

## 2021-03-03 ENCOUNTER — Emergency Department (HOSPITAL_COMMUNITY): Payer: Medicare Other

## 2021-03-03 ENCOUNTER — Emergency Department (HOSPITAL_COMMUNITY)
Admission: EM | Admit: 2021-03-03 | Discharge: 2021-03-03 | Disposition: A | Discharge status: Home / Self Care | Payer: Medicare Other | Attending: Emergency Medicine | Admitting: Emergency Medicine

## 2021-03-03 DIAGNOSIS — R0602 Shortness of breath: Secondary | ICD-10-CM | POA: Diagnosis not present

## 2021-03-03 DIAGNOSIS — J019 Acute sinusitis, unspecified: Secondary | ICD-10-CM | POA: Diagnosis not present

## 2021-03-03 DIAGNOSIS — Z20822 Contact with and (suspected) exposure to covid-19: Secondary | ICD-10-CM | POA: Insufficient documentation

## 2021-03-03 DIAGNOSIS — J069 Acute upper respiratory infection, unspecified: Secondary | ICD-10-CM | POA: Diagnosis not present

## 2021-03-03 DIAGNOSIS — B9789 Other viral agents as the cause of diseases classified elsewhere: Secondary | ICD-10-CM | POA: Diagnosis not present

## 2021-03-03 DIAGNOSIS — J449 Chronic obstructive pulmonary disease, unspecified: Secondary | ICD-10-CM | POA: Diagnosis not present

## 2021-03-03 DIAGNOSIS — R059 Cough, unspecified: Secondary | ICD-10-CM | POA: Diagnosis not present

## 2021-03-03 DIAGNOSIS — I517 Cardiomegaly: Secondary | ICD-10-CM | POA: Diagnosis not present

## 2021-03-03 DIAGNOSIS — J90 Pleural effusion, not elsewhere classified: Secondary | ICD-10-CM | POA: Diagnosis not present

## 2021-03-03 DIAGNOSIS — R519 Headache, unspecified: Secondary | ICD-10-CM | POA: Diagnosis present

## 2021-03-03 LAB — RESP PANEL BY RT-PCR (FLU A&B, COVID) ARPGX2
Influenza A by PCR: NEGATIVE
Influenza B by PCR: NEGATIVE
SARS Coronavirus 2 by RT PCR: NEGATIVE

## 2021-03-03 MED ORDER — AMOXICILLIN 500 MG PO CAPS
500.0000 mg | ORAL_CAPSULE | Freq: Once | ORAL | Status: AC
  Administered 2021-03-03: 500 mg via ORAL
  Filled 2021-03-03: qty 1

## 2021-03-03 MED ORDER — AMOXICILLIN 500 MG PO CAPS: 500.0000 mg | ORAL_CAPSULE | Freq: Three times a day (TID) | ORAL | 0 refills | Status: AC

## 2021-03-03 MED ORDER — PREDNISONE 20 MG PO TABS: 40.0000 mg | ORAL_TABLET | Freq: Every day | ORAL | 0 refills | Status: DC

## 2021-03-03 MED ORDER — PREDNISONE 20 MG PO TABS
60.0000 mg | ORAL_TABLET | Freq: Once | ORAL | Status: AC
  Administered 2021-03-03: 60 mg via ORAL
  Filled 2021-03-03: qty 3

## 2021-03-03 NOTE — ED Provider Notes (Signed)
[facility]-EMERGENCY DEPT Provider Note   CSN: [id number] Arrival date & time: 03/03/21  4098     History  Chief Complaint  Patient presents with   chest congestion   Otalgia    Dawn Lane is a 71 y.o. female.  Patient is a 71 year old female with a history of COPD, polysubstance use and arthritis who is presenting today with multiple complaints.  She says for the last 10 days she has had ongoing left ear pain, facial pain and is now starting to have drainage from her left nare.  She feels that it is exacerbating her COPD and she reports that she even tried using her inhaler yesterday and it was not helping.  She has not had any significant cough and does not report having a fever.  She reports intermittent shortness of breath but is not having any difficulty right now.  She denies any chest pain, abdominal pain, nausea or vomiting.  The history is provided by the patient.  Otalgia Location:  Left     Home Medications Prior to Admission medications   Medication Sig Start Date End Date Taking? Authorizing Provider  amoxicillin (AMOXIL) 500 MG capsule Take 1 capsule (500 mg total) by mouth 3 (three) times daily. 03/03/21  Yes Caellum Mancil, Alphonzo Lemmings, MD  predniSONE (DELTASONE) 20 MG tablet Take 2 tablets (40 mg total) by mouth daily. 03/03/21  Yes Robi Mitter, Alphonzo Lemmings, MD  albuterol (PROVENTIL HFA;VENTOLIN HFA) 108 (90 BASE) MCG/ACT inhaler Inhale 2 puffs into the lungs every 6 (six) hours as needed for wheezing. 09/14/12   Ward, Layla Maw, DO  albuterol (PROVENTIL) (2.5 MG/3ML) 0.083% nebulizer solution Take 3 mLs (2.5 mg total) by nebulization every 4 (four) hours as needed for wheezing or shortness of breath. 04/15/14   Samuel Jester, DO  alum & mag hydroxide-simeth (MAALOX PLUS) 400-400-40 MG/5ML suspension Take 10 mLs by mouth every 6 (six) hours as needed for indigestion. 06/07/20   Valinda Hoar, NP  Ascorbic Acid (VITAMIN C PO) Take 1 tablet by mouth in the  morning and at bedtime.     [provider]  BELBUCA 900 MCG FILM Take 1 strip by mouth 2 (two) times daily. 06/30/19   [provider]  cephALEXin (KEFLEX) 500 MG capsule Take 1 capsule (500 mg total) by mouth 2 (two) times daily. 09/18/20   Wallis Bamberg, PA-C  diclofenac Sodium (VOLTAREN) 1 % GEL Apply 2 g topically 4 (four) times daily as needed (pain).     [provider]  etodolac (LODINE XL) 400 MG 24 hr tablet Take 400 mg by mouth daily as needed (pain).  06/24/19   [provider]  gabapentin (NEURONTIN) 800 MG tablet Take 800 mg by mouth 4 (four) times daily. 06/25/19   [provider]  Multiple Vitamins-Minerals (ZINC PO) Take 1 tablet by mouth in the morning and at bedtime.    [provider]  ondansetron (ZOFRAN) 4 MG tablet Take 1 tablet (4 mg total) by mouth every 6 (six) hours. 06/07/20   White, Elita Boone, NP  oxyCODONE (ROXICODONE) 15 MG immediate release tablet Take 15 mg by mouth 5 (five) times daily. 06/28/19   [provider]  polycarbophil (FIBERCON) 625 MG tablet Take 625 mg by mouth daily.    [provider]  polyethylene glycol powder (GLYCOLAX/MIRALAX) 17 GM/SCOOP powder Take 17 g by mouth 2 (two) times daily. 08/28/20   Geoffery Lyons, MD  pregabalin (LYRICA) 100 MG capsule Take 100 mg by mouth  2 (two) times daily. 05/22/20   [provider]  sulfamethoxazole-trimethoprim (BACTRIM DS) 800-160 MG tablet Take 1 tablet by mouth 2 (two) times daily. 03/21/20   [provider]  tiZANidine (ZANAFLEX) 4 MG tablet Take 4 mg by mouth 3 (three) times daily as needed for muscle spasms.  06/25/19   [provider]  Vitamin D, Ergocalciferol, (DRISDOL) 50000 units CAPS capsule Take 50,000 Units by mouth every Sunday.  11/04/16   [provider]      Allergies    Lorazepam, Advil [ibuprofen], and Vicodin [hydrocodone-acetaminophen]    Review of Systems   Review of Systems  HENT:  Positive  for ear pain.    Physical Exam Updated Vital Signs BP (!) 119/95 (BP Location: Left Arm)    Pulse 72    Temp 98.3 F (36.8 C) (Oral)    Resp 18    SpO2 97%  Physical Exam Vitals and nursing note reviewed.  Constitutional:      General: She is not in acute distress.    Appearance: She is well-developed.  HENT:     Head: Normocephalic and atraumatic.     Right Ear: A middle ear effusion is present.     Left Ear: A middle ear effusion is present. Tympanic membrane is bulging.     Nose: Mucosal edema present.     Left Turbinates: Enlarged and swollen.     Left Sinus: Maxillary sinus tenderness and frontal sinus tenderness present.  Eyes:     Pupils: Pupils are equal, round, and reactive to light.  Cardiovascular:     Rate and Rhythm: Normal rate and regular rhythm.     Heart sounds: Normal heart sounds. No murmur heard.   No friction rub.  Pulmonary:     Effort: Pulmonary effort is normal.     Breath sounds: Normal breath sounds. No wheezing or rales.     Comments: Minimal decreased breath sounds but overall clear no wheezing Abdominal:     General: Bowel sounds are normal. There is no distension.     Palpations: Abdomen is soft.     Tenderness: There is no abdominal tenderness. There is no guarding or rebound.  Musculoskeletal:        General: No tenderness. Normal range of motion.     Cervical back: Normal range of motion and neck supple.     Right lower leg: No edema.     Left lower leg: No edema.     Comments: No edema  Skin:    General: Skin is warm and dry.     Findings: No rash.  Neurological:     Mental Status: She is alert and oriented to person, place, and time. Mental status is at baseline.     Cranial Nerves: No cranial nerve deficit.  Psychiatric:        Behavior: Behavior normal.    ED Results / Procedures / Treatments   Labs (all labs ordered are listed, but only abnormal results are displayed) Labs Reviewed  RESP PANEL BY RT-PCR (FLU A&B, COVID) [id number]     EKG None  Radiology DG Chest Port 1 View  Result Date: 03/03/2021 CLINICAL DATA:  Cough and shortness of breath. Chest congestion. History of asthma. EXAM: PORTABLE CHEST 1 VIEW COMPARISON:  12/18/2020 FINDINGS: Heart is enlarged and stable in configuration. The lungs are free of focal consolidations and pleural effusions. No pulmonary edema. IMPRESSION: Stable cardiomegaly. Electronically Signed   By: Norva Pavlov M.D.   On:  03/03/2021 10:27    Procedures Procedures    Medications Ordered in ED Medications  predniSONE (DELTASONE) tablet 60 mg (has no administration in time range)  amoxicillin (AMOXIL) capsule 500 mg (has no administration in time range)    ED Course/ Medical Decision Making/ A&P                           Medical Decision Making Amount and/or Complexity of Data Reviewed External Data Reviewed: notes. Radiology: ordered and independent interpretation performed. Decision-making details documented in ED Course.  Risk OTC drugs. Prescription drug management.   Patient is a 71 year old female presenting today with symptoms most classic for subacute sinusitis.  Symptoms have now been present for at least 10 days and are worsening.  She has facial pain but denies fever.  Significant swelling in the left nare as well.  Breath sounds are clear today without any wheezing.  Vital signs are reassuring with oxygen saturation of 97% on room air with no tachypnea and normal heart rate.  She is afebrile here.  Does not appear to be having an acute COPD exacerbation at this time.  Do feel that she has a sinus infection.  Also possible viral etiology but given symptoms have been present for over 10 days and are not improving we will treat with short course of antibiotics.  Patient also given nasal spray and steroids. I independently reviewed and interpreted patient's chest x-ray today which shows no acute pathology.  Patient discharged home in good  condition.        Final Clinical Impression(s) / ED Diagnoses Final diagnoses:  Subacute sinusitis, unspecified location  Viral upper respiratory tract infection    Rx / DC Orders ED Discharge Orders          Ordered    predniSONE (DELTASONE) 20 MG tablet  Daily        03/03/21 1049    amoxicillin (AMOXIL) 500 MG capsule  3 times daily        03/03/21 1049              Gwyneth SproutPlunkett, Mirta Mally, MD 03/03/21 1050

## 2021-03-03 NOTE — Discharge Instructions (Signed)
Start taking the steroid tomorrow because you already had a dose today and you can take your next dose of antibiotic this evening before bed and then you will take it 3 times a day until it is gone.  Also start using a saline nasal spray or Nettie pot which you can get over-the-counter to help with the congestion in your nose.  Your x-ray today was normal and there is no signs of pneumonia.

## 2021-03-03 NOTE — ED Triage Notes (Signed)
Pt c/o congestion in L lung and pain in L ear. Pt states they have been doing "lemon steams" at home to clear congestion. Pt states they have been digging in L ear to try and clear it.

## 2021-03-07 DIAGNOSIS — G894 Chronic pain syndrome: Secondary | ICD-10-CM | POA: Diagnosis not present

## 2021-03-09 ENCOUNTER — Encounter (HOSPITAL_COMMUNITY): Payer: Self-pay

## 2021-03-09 ENCOUNTER — Emergency Department (HOSPITAL_COMMUNITY)
Admission: EM | Admit: 2021-03-09 | Discharge: 2021-03-09 | Disposition: A | Discharge status: Home / Self Care | Payer: Medicare Other | Attending: Emergency Medicine | Admitting: Emergency Medicine

## 2021-03-09 ENCOUNTER — Emergency Department (HOSPITAL_COMMUNITY): Payer: Medicare Other

## 2021-03-09 DIAGNOSIS — R059 Cough, unspecified: Secondary | ICD-10-CM | POA: Diagnosis not present

## 2021-03-09 DIAGNOSIS — R0602 Shortness of breath: Secondary | ICD-10-CM | POA: Diagnosis not present

## 2021-03-09 DIAGNOSIS — R5383 Other fatigue: Secondary | ICD-10-CM | POA: Insufficient documentation

## 2021-03-09 DIAGNOSIS — J449 Chronic obstructive pulmonary disease, unspecified: Secondary | ICD-10-CM | POA: Insufficient documentation

## 2021-03-09 DIAGNOSIS — R5381 Other malaise: Secondary | ICD-10-CM | POA: Insufficient documentation

## 2021-03-09 DIAGNOSIS — F17200 Nicotine dependence, unspecified, uncomplicated: Secondary | ICD-10-CM | POA: Insufficient documentation

## 2021-03-09 DIAGNOSIS — I517 Cardiomegaly: Secondary | ICD-10-CM | POA: Diagnosis not present

## 2021-03-09 DIAGNOSIS — Z743 Need for continuous supervision: Secondary | ICD-10-CM | POA: Diagnosis not present

## 2021-03-09 MED ORDER — METHYLPREDNISOLONE 4 MG PO TBPK: ORAL_TABLET | ORAL | 0 refills | Status: AC

## 2021-03-09 MED ORDER — IPRATROPIUM-ALBUTEROL 0.5-2.5 (3) MG/3ML IN SOLN
3.0000 mL | Freq: Once | RESPIRATORY_TRACT | Status: AC
  Administered 2021-03-09: 3 mL via RESPIRATORY_TRACT
  Filled 2021-03-09: qty 3

## 2021-03-09 NOTE — ED Provider Notes (Signed)
[facility]-EMERGENCY DEPT Provider Note   CSN: [id number] Arrival date & time: 03/09/21  0806     History  Chief Complaint  Patient presents with   Shortness of Breath    Dawn Lane is a 71 y.o. female.  71 year old female with prior medical history as detailed below presents for evaluation.  Patient complains of persistent cough, congestion, sinus congestion, malaise, fatigue.  He was here last week for same complaint.  She was given a prescription for amoxicillin which she is still taking.  She also was given a course of prednisone which she has completed.  She reports minimal improvement with both recently prescribed antibiotics and steroids.  She is afebrile.  She denies other acute complaint.  She is talking in full sentences during exam.  She denies chest pain.  She denies nausea or vomiting.  She reports using intermittent breathing treatments at home with minimal to no improvement.  During her evaluation her daughter arrives.  Per the daughter, the patient continues to smoke every day.  Per the daughter the patient has continued complaints similar to today's frequently.  Today's presentation is not significantly worse than last week.  The history is provided by the patient, a relative and medical records.  Shortness of Breath Severity:  Mild Onset quality:  Gradual Duration:  2 weeks Timing:  Intermittent Progression:  Waxing and waning Chronicity:  Recurrent Relieved by:  Nothing Worsened by:  Nothing     Home Medications Prior to Admission medications   Medication Sig Start Date End Date Taking? Authorizing Provider  albuterol (PROVENTIL HFA;VENTOLIN HFA) 108 (90 BASE) MCG/ACT inhaler Inhale 2 puffs into the lungs every 6 (six) hours as needed for wheezing. 09/14/12   Ward, Layla MawKristen N, DO  albuterol (PROVENTIL) (2.5 MG/3ML) 0.083% nebulizer solution Take 3 mLs (2.5 mg total) by nebulization every 4 (four) hours as needed for wheezing or  shortness of breath. 04/15/14   Samuel JesterMcManus, Kathleen, DO  alum & mag hydroxide-simeth (MAALOX PLUS) 400-400-40 MG/5ML suspension Take 10 mLs by mouth every 6 (six) hours as needed for indigestion. 06/07/20   White, Elita BooneAdrienne R, NP  amoxicillin (AMOXIL) 500 MG capsule Take 1 capsule (500 mg total) by mouth 3 (three) times daily. 03/03/21   Gwyneth SproutPlunkett, Whitney, MD  Ascorbic Acid (VITAMIN C PO) Take 1 tablet by mouth in the morning and at bedtime.     [provider]  BELBUCA 900 MCG FILM Take 1 strip by mouth 2 (two) times daily. 06/30/19   [provider]  cephALEXin (KEFLEX) 500 MG capsule Take 1 capsule (500 mg total) by mouth 2 (two) times daily. 09/18/20   Wallis BambergMani, Mario, PA-C  diclofenac Sodium (VOLTAREN) 1 % GEL Apply 2 g topically 4 (four) times daily as needed (pain).     [provider]  etodolac (LODINE XL) 400 MG 24 hr tablet Take 400 mg by mouth daily as needed (pain).  06/24/19   [provider]  gabapentin (NEURONTIN) 800 MG tablet Take 800 mg by mouth 4 (four) times daily. 06/25/19   [provider]  Multiple Vitamins-Minerals (ZINC PO) Take 1 tablet by mouth in the morning and at bedtime.    [provider]  ondansetron (ZOFRAN) 4 MG tablet Take 1 tablet (4 mg total) by mouth every 6 (six) hours. 06/07/20   White, Elita BooneAdrienne R, NP  oxyCODONE (ROXICODONE) 15 MG immediate release tablet Take 15 mg by mouth 5 (five) times daily. 06/28/19   [provider]  polycarbophil (FIBERCON) 625 MG tablet Take 625 mg by mouth daily.    [provider]  polyethylene glycol powder (GLYCOLAX/MIRALAX) 17 GM/SCOOP powder Take 17 g by mouth 2 (two) times daily. 08/28/20   Geoffery Lyons, MD  predniSONE (DELTASONE) 20 MG tablet Take 2 tablets (40 mg total) by mouth daily. 03/03/21   Gwyneth Sprout, MD  pregabalin (LYRICA) 100 MG capsule Take 100 mg by mouth 2 (two) times daily. 05/22/20   [provider]  sulfamethoxazole-trimethoprim (BACTRIM DS)  800-160 MG tablet Take 1 tablet by mouth 2 (two) times daily. 03/21/20   [provider]  tiZANidine (ZANAFLEX) 4 MG tablet Take 4 mg by mouth 3 (three) times daily as needed for muscle spasms.  06/25/19   [provider]  Vitamin D, Ergocalciferol, (DRISDOL) 50000 units CAPS capsule Take 50,000 Units by mouth every Sunday.  11/04/16   [provider]      Allergies    Lorazepam, Advil [ibuprofen], and Vicodin [hydrocodone-acetaminophen]    Review of Systems   Review of Systems  Respiratory:  Positive for shortness of breath.   All other systems reviewed and are negative.  Physical Exam Updated Vital Signs BP 134/87 (BP Location: Right Arm)    Pulse 63    Temp 98.6 F (37 C) (Oral)    Resp 17    SpO2 100%  Physical Exam Vitals and nursing note reviewed.  Constitutional:      General: She is not in acute distress.    Appearance: Normal appearance. She is well-developed.  HENT:     Head: Normocephalic and atraumatic.  Eyes:     Conjunctiva/sclera: Conjunctivae normal.     Pupils: Pupils are equal, round, and reactive to light.  Cardiovascular:     Rate and Rhythm: Normal rate and regular rhythm.     Heart sounds: Normal heart sounds.  Pulmonary:     Effort: Pulmonary effort is normal. No respiratory distress.     Breath sounds: Normal breath sounds.  Abdominal:     General: There is no distension.     Palpations: Abdomen is soft.     Tenderness: There is no abdominal tenderness.  Musculoskeletal:        General: No deformity. Normal range of motion.     Cervical back: Normal range of motion and neck supple.  Skin:    General: Skin is warm and dry.  Neurological:     General: No focal deficit present.     Mental Status: She is alert and oriented to person, place, and time.    ED Results / Procedures / Treatments   Labs (all labs ordered are listed, but only abnormal results are displayed) Labs Reviewed - No data to  display  EKG None  Radiology No results found.  Procedures Procedures    Medications Ordered in ED Medications - No data to display  ED Course/ Medical Decision Making/ A&P                           Medical Decision Making Amount and/or Complexity of Data Reviewed Radiology: ordered.    Medical Screen Complete  This patient presented to the ED with complaint of cough, congestion.  This complaint involves an extensive number of treatment options. The initial differential diagnosis includes, but is not limited to, viral infection, COPD exacerbation, pneumonia, etc.  This presentation is: Acute, Chronic, Self-Limited, Previously Undiagnosed, Uncertain Prognosis, Complicated, Systemic Symptoms, and Threat to Life/Bodily  Function  Patient is presenting with complaints of continued cough, congestion, malaise.  She was seen for same complaint last week.  Symptoms have not worsened significantly since that evaluation.  She is still taking amoxicillin as previously prescribed.  She has completed a course of prednisone as previously prescribed.  Patient's chest x-ray obtained today is without evidence of new or worsening infiltrate.  Patient with reassuringly normal vitals and exam.  Patient and patient's daughter reassured.  Patient may benefit from continued course of prednisone.  I suspect that her symptoms are primarily viral in origin with concurrent COPD.  Importance of close follow-up is stressed.  Strict return precautions given and understood.  Co morbidities that complicated the patient's evaluation  COPD, continued cigarette use   Additional history obtained:  Additional history obtained from [facility] External records from outside sources obtained and reviewed including prior ED visits and prior Inpatient records.    Imaging Studies ordered:  I ordered imaging studies including chest x-ray I independently visualized and interpreted obtained imaging which showed  NAD I agree with the radiologist interpretation.   Cardiac Monitoring:  The patient was maintained on a cardiac monitor.  I personally viewed and interpreted the cardiac monitor which showed an underlying rhythm of: NSR  Problem List / ED Course:  Cough, congestion   Reevaluation:  After the interventions noted above, I reevaluated the patient and found that they have: stayed the same   Disposition:  After consideration of the diagnostic results and the patients response to treatment, I feel that the patent would benefit from close outpatient follow-up.          Final Clinical Impression(s) / ED Diagnoses Final diagnoses:  None    Rx / DC Orders ED Discharge Orders     None         Wynetta Fines, MD 03/09/21 1051

## 2021-03-09 NOTE — Discharge Instructions (Addendum)
Return for any problem.  ? ?Use Afrin as instructed. ? ?Take Medrol as prescribed. ? ?Finish entire course of antibiotics as previously prescribed.  ? ?Do not crush and snort pills. ? ?

## 2021-03-09 NOTE — ED Triage Notes (Signed)
Pt presents with c/o shortness of breath. Pt ambulatory on scene, walked to the EMS truck. Pt reports she was diagnosed with a sinus infection that she believes is causing a lung infection. Pt reports she feels that she has been having to cough constantly, nonproductive. Pt is on abx and prednisone.  ?

## 2021-03-15 DIAGNOSIS — F17210 Nicotine dependence, cigarettes, uncomplicated: Secondary | ICD-10-CM | POA: Diagnosis not present

## 2021-03-15 DIAGNOSIS — Z9181 History of falling: Secondary | ICD-10-CM | POA: Diagnosis not present

## 2021-03-15 DIAGNOSIS — M25552 Pain in left hip: Secondary | ICD-10-CM | POA: Diagnosis not present

## 2021-03-15 DIAGNOSIS — M545 Low back pain, unspecified: Secondary | ICD-10-CM | POA: Diagnosis not present

## 2021-03-15 DIAGNOSIS — Z79899 Other long term (current) drug therapy: Secondary | ICD-10-CM | POA: Diagnosis not present

## 2021-03-15 DIAGNOSIS — M25512 Pain in left shoulder: Secondary | ICD-10-CM | POA: Diagnosis not present

## 2021-03-19 DIAGNOSIS — Z79899 Other long term (current) drug therapy: Secondary | ICD-10-CM | POA: Diagnosis not present

## 2021-04-09 DIAGNOSIS — G894 Chronic pain syndrome: Secondary | ICD-10-CM | POA: Diagnosis not present

## 2021-04-16 DIAGNOSIS — M545 Low back pain, unspecified: Secondary | ICD-10-CM | POA: Diagnosis not present

## 2021-04-16 DIAGNOSIS — M25512 Pain in left shoulder: Secondary | ICD-10-CM | POA: Diagnosis not present

## 2021-04-16 DIAGNOSIS — M25552 Pain in left hip: Secondary | ICD-10-CM | POA: Diagnosis not present

## 2021-04-16 DIAGNOSIS — F17210 Nicotine dependence, cigarettes, uncomplicated: Secondary | ICD-10-CM | POA: Diagnosis not present

## 2021-04-16 DIAGNOSIS — Z9181 History of falling: Secondary | ICD-10-CM | POA: Diagnosis not present

## 2021-04-16 DIAGNOSIS — Z79899 Other long term (current) drug therapy: Secondary | ICD-10-CM | POA: Diagnosis not present

## 2021-04-18 DIAGNOSIS — Z79899 Other long term (current) drug therapy: Secondary | ICD-10-CM | POA: Diagnosis not present

## 2021-04-30 DIAGNOSIS — M545 Low back pain, unspecified: Secondary | ICD-10-CM | POA: Diagnosis not present

## 2021-04-30 DIAGNOSIS — M542 Cervicalgia: Secondary | ICD-10-CM | POA: Diagnosis not present

## 2021-04-30 DIAGNOSIS — G8929 Other chronic pain: Secondary | ICD-10-CM | POA: Diagnosis not present

## 2021-04-30 DIAGNOSIS — M25519 Pain in unspecified shoulder: Secondary | ICD-10-CM | POA: Diagnosis not present

## 2021-04-30 DIAGNOSIS — Z79899 Other long term (current) drug therapy: Secondary | ICD-10-CM | POA: Diagnosis not present

## 2021-07-20 ENCOUNTER — Inpatient Hospital Stay: Admission: RE | Admit: 2021-07-20 | Payer: Medicare Other | Source: Ambulatory Visit

## 2021-09-11 ENCOUNTER — Ambulatory Visit: Payer: Medicare Other

## 2021-09-13 ENCOUNTER — Ambulatory Visit (HOSPITAL_COMMUNITY)
Admission: EM | Admit: 2021-09-13 | Discharge: 2021-09-13 | Disposition: A | Discharge status: Home / Self Care | Payer: Medicare Other | Attending: Internal Medicine | Admitting: Internal Medicine

## 2021-09-13 ENCOUNTER — Encounter (HOSPITAL_COMMUNITY): Payer: Self-pay | Admitting: Emergency Medicine

## 2021-09-13 DIAGNOSIS — M791 Myalgia, unspecified site: Secondary | ICD-10-CM | POA: Diagnosis not present

## 2021-09-13 DIAGNOSIS — Z760 Encounter for issue of repeat prescription: Secondary | ICD-10-CM

## 2021-09-13 DIAGNOSIS — M549 Dorsalgia, unspecified: Secondary | ICD-10-CM

## 2021-09-13 DIAGNOSIS — M542 Cervicalgia: Secondary | ICD-10-CM | POA: Diagnosis not present

## 2021-09-13 NOTE — ED Provider Notes (Signed)
[facility]    CSN: [id number] Arrival date & time: 09/13/21  1819      History   Chief Complaint Chief Complaint  Patient presents with   Medication Refill   Hip Pain    HPI Dawn Lane is a 71 y.o. female patient comes to the urgent care requesting medication refills.  Patient has chronic pain currently on buprenorphine, gabapentin, Lyrica, Neurontin, oxycodone.  She comes to the urgent care with request for Belbuca refill.  Apparently has run out of her Belbuca yesterday.  She has not followed up with her pain specialist.  She apparently switching from 1 pain specialist to another but she was not very clear as to the process and was not able to give good insight to why she left the prior practice before going to another pain management specialist.  She complains of pain in the neck hip and arms.  No nausea, vomiting or diarrhea. HPI  Past Medical History:  Diagnosis Date   Anxiety 2011   Arthritis    Asthma    Emphysema    Hemorrhoids    Polysubstance abuse (HCC) 2011   4 day [facility] admission for this. Cocaine, ETOH, MJA,    Tobacco use     Patient Active Problem List   Diagnosis Date Noted   COPD active smoker/ pfts pending 01/04/2016   Colitis 08/04/2013   Abdominal pain 08/04/2013   Nausea, vomiting and diarrhea 08/04/2013   Cigarette smoker 08/04/2013   Dyspnea 08/02/2010    Past Surgical History:  Procedure Laterality Date   COLONOSCOPY N/A 08/07/2013   Procedure: COLONOSCOPY;  Surgeon: Iva Boop, MD;  Location: [facility] ENDOSCOPY;  Service: Endoscopy;  Laterality: N/A;   INCISION AND DRAINAGE  12/2009   of thrombosed internal hemorrhoid   rc repair Left 10/14/2018   by Dr. Luiz Blare   TUBAL LIGATION      OB History   No obstetric history on file.      Home Medications    Prior to Admission medications   Medication Sig Start Date End Date Taking? Authorizing Provider  albuterol (PROVENTIL HFA;VENTOLIN HFA) 108 (90 BASE) MCG/ACT  inhaler Inhale 2 puffs into the lungs every 6 (six) hours as needed for wheezing. 09/14/12   Ward, Layla Maw, DO  albuterol (PROVENTIL) (2.5 MG/3ML) 0.083% nebulizer solution Take 3 mLs (2.5 mg total) by nebulization every 4 (four) hours as needed for wheezing or shortness of breath. 04/15/14   Samuel Jester, DO  alum & mag hydroxide-simeth (MAALOX PLUS) 400-400-40 MG/5ML suspension Take 10 mLs by mouth every 6 (six) hours as needed for indigestion. 06/07/20   White, Elita Boone, NP  amoxicillin (AMOXIL) 500 MG capsule Take 1 capsule (500 mg total) by mouth 3 (three) times daily. 03/03/21   Gwyneth Sprout, MD  Ascorbic Acid (VITAMIN C PO) Take 1 tablet by mouth in the morning and at bedtime.     [provider]  BELBUCA 900 MCG FILM Take 1 strip by mouth 2 (two) times daily. 06/30/19   [provider]  cephALEXin (KEFLEX) 500 MG capsule Take 1 capsule (500 mg total) by mouth 2 (two) times daily. 09/18/20   Wallis Bamberg, PA-C  diclofenac Sodium (VOLTAREN) 1 % GEL Apply 2 g topically 4 (four) times daily as needed (pain).     [provider]  etodolac (LODINE XL) 400 MG 24 hr tablet Take 400 mg by mouth daily as needed (pain).  06/24/19   [provider]  gabapentin (NEURONTIN) 800 MG tablet Take 800 mg by mouth 4 (four) times daily. 06/25/19   [provider]  methylPREDNISolone (MEDROL DOSEPAK) 4 MG TBPK tablet Take Medrol Dosepack Taper as instructed. 03/09/21   Wynetta Fines, MD  Multiple Vitamins-Minerals (ZINC PO) Take 1 tablet by mouth in the morning and at bedtime.    [provider]  ondansetron (ZOFRAN) 4 MG tablet Take 1 tablet (4 mg total) by mouth every 6 (six) hours. 06/07/20   White, Elita Boone, NP  oxyCODONE (ROXICODONE) 15 MG immediate release tablet Take 15 mg by mouth 5 (five) times daily. 06/28/19   [provider]  polycarbophil (FIBERCON) 625 MG tablet Take 625 mg by mouth daily.    [provider]  polyethylene glycol  powder (GLYCOLAX/MIRALAX) 17 GM/SCOOP powder Take 17 g by mouth 2 (two) times daily. 08/28/20   Geoffery Lyons, MD  predniSONE (DELTASONE) 20 MG tablet Take 2 tablets (40 mg total) by mouth daily. 03/03/21   Gwyneth Sprout, MD  pregabalin (LYRICA) 100 MG capsule Take 100 mg by mouth 2 (two) times daily. 05/22/20   [provider]  sulfamethoxazole-trimethoprim (BACTRIM DS) 800-160 MG tablet Take 1 tablet by mouth 2 (two) times daily. 03/21/20   [provider]  tiZANidine (ZANAFLEX) 4 MG tablet Take 4 mg by mouth 3 (three) times daily as needed for muscle spasms.  06/25/19   [provider]  Vitamin D, Ergocalciferol, (DRISDOL) 50000 units CAPS capsule Take 50,000 Units by mouth every Sunday.  11/04/16   [provider]    Family History Family History  Problem Relation Age of Onset   Emphysema Father    Lung cancer Father        was a smoker   Heart disease Father    Prostate cancer Father    Asthma Sister    Uterine cancer Mother     Social History Social History   Tobacco Use   Smoking status: Every Day    Packs/day: 0.50    Years: 50.00    Total pack years: 25.00    Types: Cigarettes   Smokeless tobacco: Never  Substance Use Topics   Alcohol use: Yes    Comment: BAC .20   Drug use: Yes    Frequency: 7.0 times per week    Types: Marijuana, Cocaine, "Crack" cocaine, Benzodiazepines    Comment: UDS + Cocaine- denies 06/12/18     Allergies   Lorazepam, Advil [ibuprofen], and Vicodin [hydrocodone-acetaminophen]   Review of Systems Review of Systems  Musculoskeletal:  Positive for arthralgias, back pain, myalgias and neck pain. Negative for joint swelling and neck stiffness.     Physical Exam Triage Vital Signs ED Triage Vitals  Enc Vitals Group     BP 09/13/21 1834 (!) 148/78     Pulse Rate 09/13/21 1834 79     Resp 09/13/21 1834 20     Temp 09/13/21 1834 98.1 F (36.7 C)     Temp Source 09/13/21 1834 Oral     SpO2 09/13/21  1834 94 %     Weight --      Height --      Head Circumference --      Peak Flow --      Pain Score 09/13/21 1831 7     Pain Loc --      Pain Edu? --      Excl. in GC? --    No data found.  Updated Vital Signs BP Marland Kitchen)  148/78 (BP Location: Right Arm)   Pulse 79   Temp 98.1 F (36.7 C) (Oral)   Resp 20   SpO2 94%   Visual Acuity Right Eye Distance:   Left Eye Distance:   Bilateral Distance:    Right Eye Near:   Left Eye Near:    Bilateral Near:     Physical Exam Vitals and nursing note reviewed.  Constitutional:      General: She is in acute distress.     Appearance: Normal appearance. She is not ill-appearing, toxic-appearing or diaphoretic.  HENT:     Right Ear: Tympanic membrane normal.     Left Ear: Tympanic membrane normal.  Cardiovascular:     Rate and Rhythm: Normal rate and regular rhythm.     Pulses: Normal pulses.     Heart sounds: Normal heart sounds.  Pulmonary:     Effort: Pulmonary effort is normal.     Breath sounds: Normal breath sounds.  Abdominal:     General: Bowel sounds are normal.     Palpations: Abdomen is soft.  Musculoskeletal:        General: Normal range of motion.  Neurological:     Mental Status: She is alert.      UC Treatments / Results  Labs (all labs ordered are listed, but only abnormal results are displayed) Labs Reviewed - No data to display  EKG   Radiology No results found.  Procedures Procedures (including critical care time)  Medications Ordered in UC Medications - No data to display  Initial Impression / Assessment and Plan / UC Course  I have reviewed the triage vital signs and the nursing notes.  Pertinent labs & imaging results that were available during my care of the patient were reviewed by me and considered in my medical decision making (see chart for details).     1.  Encounter for medication refill: After extensive discussion with the patient, I advised her to follow-up with a pain  specialist to prescribe buprenorphine for because that is the medication that has helped manage her pain.  The patient saw her primary care physician yesterday but she did not get a prescription from the primary care physician.  On further questioning patient says the primary care physician was unwilling to give her buprenorphine prescription.  She opted to look for another health facility, she mention [facility], to go for [employer] refill.  Patient was hemodynamically stable at the time of leaving the clinic. Final Clinical Impressions(s) / UC Diagnoses   Final diagnoses:  Medication refill     Discharge Instructions      Recommend you follow-up with your pain specialist to have your medications refilled. You can take extra strength Tylenol in addition to your other pain medications that you currently have on hand. Reach out to your primary care team to help coordinate your pain management needs.   ED Prescriptions   None    I have reviewed the PDMP during this encounter.   Merrilee Jansky, MD 09/13/21 [phone]

## 2021-09-13 NOTE — Discharge Instructions (Signed)
Recommend you follow-up with your pain specialist to have your medications refilled. You can take extra strength Tylenol in addition to your other pain medications that you currently have on hand. Reach out to your primary care team to help coordinate your pain management needs.

## 2021-09-13 NOTE — ED Triage Notes (Signed)
Pt reports that out of her pain medications and patches. Reports doctor was missing for couple of months and got a referral to a pain clinic so waiting to get in to see them.  Pain in left hip and neck.

## 2021-09-17 ENCOUNTER — Other Ambulatory Visit: Payer: Self-pay | Admitting: Family Medicine

## 2021-09-17 DIAGNOSIS — Z1231 Encounter for screening mammogram for malignant neoplasm of breast: Secondary | ICD-10-CM

## 2021-10-16 DIAGNOSIS — M542 Cervicalgia: Secondary | ICD-10-CM | POA: Diagnosis not present

## 2021-10-17 DIAGNOSIS — M25511 Pain in right shoulder: Secondary | ICD-10-CM | POA: Diagnosis not present

## 2021-10-17 DIAGNOSIS — M25512 Pain in left shoulder: Secondary | ICD-10-CM | POA: Diagnosis not present

## 2021-10-17 DIAGNOSIS — G894 Chronic pain syndrome: Secondary | ICD-10-CM | POA: Diagnosis not present

## 2021-10-17 DIAGNOSIS — G8929 Other chronic pain: Secondary | ICD-10-CM | POA: Diagnosis not present

## 2021-10-30 ENCOUNTER — Ambulatory Visit: Payer: Medicare Other

## 2021-11-01 DIAGNOSIS — G894 Chronic pain syndrome: Secondary | ICD-10-CM | POA: Diagnosis not present

## 2021-11-01 DIAGNOSIS — J449 Chronic obstructive pulmonary disease, unspecified: Secondary | ICD-10-CM | POA: Diagnosis not present

## 2021-11-05 DIAGNOSIS — Z0289 Encounter for other administrative examinations: Secondary | ICD-10-CM | POA: Diagnosis not present

## 2021-11-05 DIAGNOSIS — J449 Chronic obstructive pulmonary disease, unspecified: Secondary | ICD-10-CM | POA: Diagnosis not present

## 2021-11-28 DIAGNOSIS — J449 Chronic obstructive pulmonary disease, unspecified: Secondary | ICD-10-CM | POA: Diagnosis not present

## 2021-11-28 DIAGNOSIS — G894 Chronic pain syndrome: Secondary | ICD-10-CM | POA: Diagnosis not present

## 2022-01-16 ENCOUNTER — Emergency Department (HOSPITAL_COMMUNITY): Payer: 59

## 2022-01-16 ENCOUNTER — Encounter (HOSPITAL_COMMUNITY): Payer: Self-pay

## 2022-01-16 ENCOUNTER — Other Ambulatory Visit: Payer: Self-pay

## 2022-01-16 ENCOUNTER — Emergency Department (HOSPITAL_COMMUNITY)
Admission: EM | Admit: 2022-01-16 | Discharge: 2022-01-17 | Disposition: A | Discharge status: Home / Self Care | Payer: 59 | Attending: Emergency Medicine | Admitting: Emergency Medicine

## 2022-01-16 DIAGNOSIS — R6889 Other general symptoms and signs: Secondary | ICD-10-CM | POA: Diagnosis not present

## 2022-01-16 DIAGNOSIS — R918 Other nonspecific abnormal finding of lung field: Secondary | ICD-10-CM | POA: Diagnosis not present

## 2022-01-16 DIAGNOSIS — J441 Chronic obstructive pulmonary disease with (acute) exacerbation: Secondary | ICD-10-CM | POA: Diagnosis not present

## 2022-01-16 DIAGNOSIS — Z1152 Encounter for screening for COVID-19: Secondary | ICD-10-CM | POA: Diagnosis not present

## 2022-01-16 DIAGNOSIS — J101 Influenza due to other identified influenza virus with other respiratory manifestations: Secondary | ICD-10-CM

## 2022-01-16 DIAGNOSIS — M2578 Osteophyte, vertebrae: Secondary | ICD-10-CM | POA: Diagnosis not present

## 2022-01-16 DIAGNOSIS — R059 Cough, unspecified: Secondary | ICD-10-CM | POA: Diagnosis not present

## 2022-01-16 DIAGNOSIS — M47812 Spondylosis without myelopathy or radiculopathy, cervical region: Secondary | ICD-10-CM | POA: Diagnosis not present

## 2022-01-16 DIAGNOSIS — R079 Chest pain, unspecified: Secondary | ICD-10-CM | POA: Diagnosis not present

## 2022-01-16 DIAGNOSIS — J449 Chronic obstructive pulmonary disease, unspecified: Secondary | ICD-10-CM | POA: Diagnosis not present

## 2022-01-16 DIAGNOSIS — W19XXXA Unspecified fall, initial encounter: Secondary | ICD-10-CM | POA: Diagnosis not present

## 2022-01-16 DIAGNOSIS — S0990XA Unspecified injury of head, initial encounter: Secondary | ICD-10-CM | POA: Diagnosis not present

## 2022-01-16 DIAGNOSIS — R509 Fever, unspecified: Secondary | ICD-10-CM | POA: Diagnosis not present

## 2022-01-16 DIAGNOSIS — M4692 Unspecified inflammatory spondylopathy, cervical region: Secondary | ICD-10-CM | POA: Diagnosis not present

## 2022-01-16 DIAGNOSIS — Z8616 Personal history of COVID-19: Secondary | ICD-10-CM | POA: Insufficient documentation

## 2022-01-16 DIAGNOSIS — R9431 Abnormal electrocardiogram [ECG] [EKG]: Secondary | ICD-10-CM | POA: Diagnosis not present

## 2022-01-16 DIAGNOSIS — S199XXA Unspecified injury of neck, initial encounter: Secondary | ICD-10-CM | POA: Diagnosis not present

## 2022-01-16 DIAGNOSIS — Z743 Need for continuous supervision: Secondary | ICD-10-CM | POA: Diagnosis not present

## 2022-01-16 LAB — COMPREHENSIVE METABOLIC PANEL
ALT: 21 U/L (ref 0–44)
AST: 32 U/L (ref 15–41)
Albumin: 3.9 g/dL (ref 3.5–5.0)
Alkaline Phosphatase: 54 U/L (ref 38–126)
Anion gap: 14 (ref 5–15)
BUN: 7 mg/dL — ABNORMAL LOW (ref 8–23)
CO2: 22 mmol/L (ref 22–32)
Calcium: 8.9 mg/dL (ref 8.9–10.3)
Chloride: 95 mmol/L — ABNORMAL LOW (ref 98–111)
Creatinine, Ser: 1.15 mg/dL — ABNORMAL HIGH (ref 0.44–1.00)
GFR, Estimated: 51 mL/min — ABNORMAL LOW (ref >60)
Glucose, Bld: 143 mg/dL — ABNORMAL HIGH (ref 70–99)
Potassium: 4.1 mmol/L (ref 3.5–5.1)
Sodium: 131 mmol/L — ABNORMAL LOW (ref 135–145)
Total Bilirubin: 0.9 mg/dL (ref 0.3–1.2)
Total Protein: 7.3 g/dL (ref 6.5–8.1)

## 2022-01-16 LAB — CBC WITH DIFFERENTIAL/PLATELET
Abs Immature Granulocytes: 0 10*3/uL (ref 0.00–0.07)
Basophils Absolute: 0 10*3/uL (ref 0.0–0.1)
Basophils Relative: 0 %
Eosinophils Absolute: 0 10*3/uL (ref 0.0–0.5)
Eosinophils Relative: 0 %
HCT: 49.2 % — ABNORMAL HIGH (ref 36.0–46.0)
Hemoglobin: 16.3 g/dL — ABNORMAL HIGH (ref 12.0–15.0)
Immature Granulocytes: 0 %
Lymphocytes Relative: 11 %
Lymphs Abs: 0.4 10*3/uL — ABNORMAL LOW (ref 0.7–4.0)
MCH: 27.9 pg (ref 26.0–34.0)
MCHC: 33.1 g/dL (ref 30.0–36.0)
MCV: 84.2 fL (ref 80.0–100.0)
Monocytes Absolute: 0.7 10*3/uL (ref 0.1–1.0)
Monocytes Relative: 19 %
Neutro Abs: 2.6 10*3/uL (ref 1.7–7.7)
Neutrophils Relative %: 70 %
Platelets: 263 10*3/uL (ref 150–400)
RBC: 5.84 MIL/uL — ABNORMAL HIGH (ref 3.87–5.11)
RDW: 13.4 % (ref 11.5–15.5)
WBC: 3.8 10*3/uL — ABNORMAL LOW (ref 4.0–10.5)
nRBC: 0 % (ref 0.0–0.2)

## 2022-01-16 LAB — RESP PANEL BY RT-PCR (RSV, FLU A&B, COVID)  RVPGX2
Influenza A by PCR: POSITIVE — AB
Influenza B by PCR: NEGATIVE
Resp Syncytial Virus by PCR: NEGATIVE
SARS Coronavirus 2 by RT PCR: NEGATIVE

## 2022-01-16 LAB — LACTIC ACID, PLASMA
Lactic Acid, Venous: 2.6 mmol/L (ref 0.5–1.9)
Lactic Acid, Venous: 1.5 mmol/L (ref 0.5–1.9)

## 2022-01-16 MED ORDER — ACETAMINOPHEN 325 MG PO TABS
650.0000 mg | ORAL_TABLET | Freq: Once | ORAL | Status: AC
  Administered 2022-01-16: 650 mg via ORAL
  Filled 2022-01-16: qty 2

## 2022-01-16 MED ORDER — LACTATED RINGERS IV SOLN: INTRAVENOUS | Status: DC

## 2022-01-16 MED ORDER — METHYLPREDNISOLONE SODIUM SUCC 125 MG IJ SOLR
125.0000 mg | Freq: Once | INTRAMUSCULAR | Status: AC
  Administered 2022-01-16: 125 mg via INTRAVENOUS
  Filled 2022-01-16: qty 2

## 2022-01-16 MED ORDER — IPRATROPIUM BROMIDE 0.02 % IN SOLN
0.5000 mg | Freq: Once | RESPIRATORY_TRACT | Status: AC
  Administered 2022-01-16: 0.5 mg via RESPIRATORY_TRACT
  Filled 2022-01-16: qty 2.5

## 2022-01-16 MED ORDER — ALBUTEROL SULFATE (2.5 MG/3ML) 0.083% IN NEBU
5.0000 mg | INHALATION_SOLUTION | Freq: Once | RESPIRATORY_TRACT | Status: AC
  Administered 2022-01-16: 5 mg via RESPIRATORY_TRACT
  Filled 2022-01-16: qty 6

## 2022-01-16 MED ORDER — LACTATED RINGERS IV BOLUS
1000.0000 mL | Freq: Once | INTRAVENOUS | Status: AC
  Administered 2022-01-16: 1000 mL via INTRAVENOUS

## 2022-01-16 MED ORDER — IPRATROPIUM-ALBUTEROL 0.5-2.5 (3) MG/3ML IN SOLN
3.0000 mL | Freq: Once | RESPIRATORY_TRACT | Status: AC
  Administered 2022-01-16: 3 mL via RESPIRATORY_TRACT
  Filled 2022-01-16: qty 3

## 2022-01-16 NOTE — ED Provider Triage Note (Signed)
Emergency Medicine Provider Triage Evaluation Note  Dawn Lane , a 72 y.o. female  was evaluated in triage.  Pt complains of fever, cough and body aches for the past 3 days. Has not taken anything for her symptoms. Very poor historian  Review of Systems  Positive:  Cough, fever Negative: Cp, sob  Physical Exam  BP (!) 133/99 (BP Location: Right Arm)   Pulse (!) 133   Temp (!) 101.1 F (38.4 C) (Oral)   Resp (!) 22   Ht 5\' 6"  (1.676 m)   Wt 55.3 kg   SpO2 94%   BMI 19.69 kg/m  Gen:   Awake, no distress   Resp:  Normal effort  MSK:   Moves extremities without difficulty  Other:  Minimal wheezing noted  Medical Decision Making  Medically screening exam initiated at 9:38 AM.  Appropriate orders placed.  Dawn Lane was informed that the remainder of the evaluation will be completed by another provider, this initial triage assessment does not replace that evaluation, and the importance of remaining in the ED until their evaluation is complete.     Janeece Fitting, PA-C 01/16/22 [phone]

## 2022-01-16 NOTE — ED Notes (Signed)
So this pt's IV was infiltrated prior to her arrival in CT. We disconnected her from the LR she had running at the time. Her arm is swollen above her IV sight. We DID NOT inject any Contrast. We did not remove her IV, so the RN may evaluate the sight and discontinue the IV. Pt refused to be stuck again and refused her PE study. However , she did allow [place] to scan her head & C-spine Non-Contrast.   Pt was also sitting on a bedpan overflowing with urine, to the point that it had soaked through the bedcover and the foam mattress underneath was completely saturated and soggy. It was also overflowing onto the floor. Pt stated that she had been sitting on that bedpan since 14:00 today (this time was 2030 today). "6.5 hours". The urine was all the way up the pt's back, legs, and bottom.  This CT tech and CT tech [place] cleaned pt, switched stretcher, changed pt gown and linens.

## 2022-01-16 NOTE — ED Notes (Signed)
Patient notified of the need for a urine sample. Patient acknowledges and reports understanding of call bell education

## 2022-01-16 NOTE — ED Notes (Signed)
Pt asking for food and water, this RN informed pt that she cannot have anything until her CTs are back and the MD gives the okay. Pt upset, stating that she wants to leave. Pt resting in bed, calmed down, and informed as soon as she can eat that this RN will bring her something.

## 2022-01-16 NOTE — ED Triage Notes (Signed)
EMS stated pt. Has a fever 100, coughing up mucous for the last 3 days.

## 2022-01-16 NOTE — ED Provider Notes (Signed)
  Physical Exam  BP (!) 139/92   Pulse 84   Temp 99.3 F (37.4 C)   Resp 20   Ht 5\' 6"  (1.676 m)   Wt 55.3 kg   SpO2 98%   BMI 19.69 kg/m   Physical Exam  Procedures  Procedures  ED Course / MDM   Received care of patient from the [facility].  Please see his note for prior history, physical and care.  Briefly this is a 72 year old female who presented with cough, congestion, shortness of breath.  She was found to have influenza A.  She had some continuing tachycardia and dyspnea and a CT PE study was ordered by Dr. Zenia Resides.  She was given albuterol, Atrovent, Solu-Medrol, and IV fluids.   There was a significant delay in her obtaining her CTs due to her receiving a nebulizer treatment.  She had reported a fall and had headache and neck pain, CT head and cervical spine were obtained which showed no evidence of intracranial injury or fracture.  Her IV had appeared to infiltrate at time of CT's evaluation.  She reports she does not wish to have another IV stick to pursue the CT PE scan.  The initial tachycardia she had had when she was with Dr. Zenia Resides has resolved with hydration.  I suspect most likely her tachycardia is secondary to dehydration in the setting of low intake with influenza A, and with improvement of breathing with breathing treatments, suspect COPD exacerbation secondary to influenza.  She does not have any asymmetric leg swelling, no history of DVT or PE, no history of recent surgeries or immobilization, and my overall suspicion for pulmonary embolus at this time is low.  Given her wish to not obtain another IV or CT scan, improvement as described above, have discussed with patient and canceled the CT PE study.  Will plan continued influenza treatment and COPD exacerbation treatment with Tamiflu and prednisone.  Given albuterol inhaler and prescription for additional nebulizers and a new nebulizer at home as she reports hers is broken. Discussed reasons to return. Patient discharged in  stable condition with understanding of reasons to return.        Gareth Morgan, MD 01/17/22 1331

## 2022-01-16 NOTE — ED Provider Notes (Signed)
[facility] EMERGENCY DEPARTMENT Provider Note   CSN: [id number] Arrival date & time: 01/16/22  0848     History  Chief Complaint  Patient presents with   Fever   Cough   Generalized Body Aches    Dawn Lane is a 72 y.o. female.  72 year old female with history of COPD presents with 3 days of cough, fever, congestion.  Patient states that she gets short of breath with ambulation is with coughing.  Denies any vomiting or diarrhea.  No abdominal or chest discomfort.  Has been medicating at home with Tylenol without relief       Home Medications Prior to Admission medications   Medication Sig Start Date End Date Taking? Authorizing Provider  albuterol (PROVENTIL HFA;VENTOLIN HFA) 108 (90 BASE) MCG/ACT inhaler Inhale 2 puffs into the lungs every 6 (six) hours as needed for wheezing. 09/14/12   Ward, Delice Bison, DO  albuterol (PROVENTIL) (2.5 MG/3ML) 0.083% nebulizer solution Take 3 mLs (2.5 mg total) by nebulization every 4 (four) hours as needed for wheezing or shortness of breath. 04/15/14   Francine Graven, DO  alum & mag hydroxide-simeth (MAALOX PLUS) 400-400-40 MG/5ML suspension Take 10 mLs by mouth every 6 (six) hours as needed for indigestion. 06/07/20   White, Leitha Schuller, NP  amoxicillin (AMOXIL) 500 MG capsule Take 1 capsule (500 mg total) by mouth 3 (three) times daily. 03/03/21   Blanchie Dessert, MD  Ascorbic Acid (VITAMIN C PO) Take 1 tablet by mouth in the morning and at bedtime.     [provider]  BELBUCA 900 MCG FILM Take 1 strip by mouth 2 (two) times daily. 06/30/19   [provider]  cephALEXin (KEFLEX) 500 MG capsule Take 1 capsule (500 mg total) by mouth 2 (two) times daily. 09/18/20   Jaynee Eagles, PA-C  diclofenac Sodium (VOLTAREN) 1 % GEL Apply 2 g topically 4 (four) times daily as needed (pain).     [provider]  etodolac (LODINE XL) 400 MG 24 hr tablet Take 400 mg by mouth daily as needed (pain).  06/24/19    [provider]  gabapentin (NEURONTIN) 800 MG tablet Take 800 mg by mouth 4 (four) times daily. 06/25/19   [provider]  methylPREDNISolone (MEDROL DOSEPAK) 4 MG TBPK tablet Take Medrol Dosepack Taper as instructed. 03/09/21   Valarie Merino, MD  Multiple Vitamins-Minerals (ZINC PO) Take 1 tablet by mouth in the morning and at bedtime.    [provider]  ondansetron (ZOFRAN) 4 MG tablet Take 1 tablet (4 mg total) by mouth every 6 (six) hours. 06/07/20   White, Leitha Schuller, NP  oxyCODONE (ROXICODONE) 15 MG immediate release tablet Take 15 mg by mouth 5 (five) times daily. 06/28/19   [provider]  polycarbophil (FIBERCON) 625 MG tablet Take 625 mg by mouth daily.    [provider]  polyethylene glycol powder (GLYCOLAX/MIRALAX) 17 GM/SCOOP powder Take 17 g by mouth 2 (two) times daily. 08/28/20   Veryl Speak, MD  predniSONE (DELTASONE) 20 MG tablet Take 2 tablets (40 mg total) by mouth daily. 03/03/21   Blanchie Dessert, MD  pregabalin (LYRICA) 100 MG capsule Take 100 mg by mouth 2 (two) times daily. 05/22/20   [provider]  sulfamethoxazole-trimethoprim (BACTRIM DS) 800-160 MG tablet Take 1 tablet by mouth 2 (two) times daily. 03/21/20   [provider]  tiZANidine (ZANAFLEX) 4 MG tablet Take 4 mg by mouth 3 (three) times daily as needed for  muscle spasms.  06/25/19   [provider]  Vitamin D, Ergocalciferol, (DRISDOL) 50000 units CAPS capsule Take 50,000 Units by mouth every Sunday.  11/04/16   [provider]      Allergies    Lorazepam, Advil [ibuprofen], and Vicodin [hydrocodone-acetaminophen]    Review of Systems   Review of Systems  All other systems reviewed and are negative.   Physical Exam Updated Vital Signs BP 122/89   Pulse (!) 116   Temp 99 F (37.2 C)   Resp 18   Ht 1.676 m (5\' 6" )   Wt 55.3 kg   SpO2 96%   BMI 19.69 kg/m  Physical Exam Vitals and nursing note reviewed.   Constitutional:      General: She is not in acute distress.    Appearance: Normal appearance. She is well-developed. She is not toxic-appearing.  HENT:     Head: Normocephalic and atraumatic.  Eyes:     General: Lids are normal.     Conjunctiva/sclera: Conjunctivae normal.     Pupils: Pupils are equal, round, and reactive to light.  Neck:     Thyroid: No thyroid mass.     Trachea: No tracheal deviation.  Cardiovascular:     Rate and Rhythm: Regular rhythm. Tachycardia present.     Heart sounds: Normal heart sounds. No murmur heard.    No gallop.  Pulmonary:     Effort: Pulmonary effort is normal. No respiratory distress.     Breath sounds: No stridor. Examination of the right-upper field reveals decreased breath sounds. Examination of the left-upper field reveals decreased breath sounds. Decreased breath sounds present. No wheezing, rhonchi or rales.  Abdominal:     General: There is no distension.     Palpations: Abdomen is soft.     Tenderness: There is no abdominal tenderness. There is no rebound.  Musculoskeletal:        General: No tenderness. Normal range of motion.     Cervical back: Normal range of motion and neck supple.  Skin:    General: Skin is warm and dry.     Findings: No abrasion or rash.  Neurological:     Mental Status: She is alert and oriented to person, place, and time. Mental status is at baseline.     GCS: GCS eye subscore is 4. GCS verbal subscore is 5. GCS motor subscore is 6.     Cranial Nerves: No cranial nerve deficit.     Sensory: No sensory deficit.     Motor: Motor function is intact.  Psychiatric:        Attention and Perception: Attention normal.        Speech: Speech normal.        Behavior: Behavior normal.     ED Results / Procedures / Treatments   Labs (all labs ordered are listed, but only abnormal results are displayed) Labs Reviewed  RESP PANEL BY RT-PCR (RSV, FLU A&B, COVID)  RVPGX2 - Abnormal; Notable for the following  components:      Result Value   Influenza A by PCR POSITIVE (*)    All other components within normal limits  LACTIC ACID, PLASMA - Abnormal; Notable for the following components:   Lactic Acid, Venous 2.6 (*)    All other components within normal limits  COMPREHENSIVE METABOLIC PANEL - Abnormal; Notable for the following components:   Sodium 131 (*)    Chloride 95 (*)    Glucose, Bld 143 (*)    BUN  7 (*)    Creatinine, Ser 1.15 (*)    GFR, Estimated 51 (*)    All other components within normal limits  CBC WITH DIFFERENTIAL/PLATELET - Abnormal; Notable for the following components:   WBC 3.8 (*)    RBC 5.84 (*)    Hemoglobin 16.3 (*)    HCT 49.2 (*)    Lymphs Abs 0.4 (*)    All other components within normal limits  URINALYSIS, ROUTINE W REFLEX MICROSCOPIC    EKG None  Radiology DG Chest 2 View  Result Date: 01/16/2022 CLINICAL DATA:  One-month history of left-sided chest pain with recent onset of cough EXAM: CHEST - 2 VIEW COMPARISON:  Chest radiograph dated 03/09/2021 FINDINGS: Hyperinflated lungs. No focal consolidations. No pleural effusion or pneumothorax. Similar cardiomediastinal silhouette. The visualized skeletal structures are unremarkable. IMPRESSION: No active cardiopulmonary disease. Electronically Signed   By: Darrin Nipper M.D.   On: 01/16/2022 09:58    Procedures Procedures    Medications Ordered in ED Medications  albuterol (PROVENTIL) (2.5 MG/3ML) 0.083% nebulizer solution 5 mg (has no administration in time range)  ipratropium (ATROVENT) nebulizer solution 0.5 mg (has no administration in time range)  lactated ringers bolus 1,000 mL (has no administration in time range)  lactated ringers infusion (has no administration in time range)  methylPREDNISolone sodium succinate (SOLU-MEDROL) 125 mg/2 mL injection 125 mg (has no administration in time range)  acetaminophen (TYLENOL) tablet 650 mg (650 mg Oral Given 01/16/22 4196)    ED Course/ Medical Decision  Making/ A&P                           Medical Decision Making Amount and/or Complexity of Data Reviewed Labs: ordered. Radiology: ordered.  Risk OTC drugs. Prescription drug management.   Patient presents with cough and congestion.  She is flu positive here.  Patient was febrile and temperature has come down.  She however remains tachycardic as well as dyspneic.  Chest x-ray per interpretation did not show any acute findings.  Admits to decreased oral intake.  Does have elevated lactate here but will be given IV fluids here.  Lung exam did show some and expiratory wheezing.  Will treat with albuterol, Atrovent, Solu-Medrol.  Due to concern that patient could have PE due to her persistent tachycardia despite defervescent, patient will require chest CT.  Will sign out to next provider        Final Clinical Impression(s) / ED Diagnoses Final diagnoses:  None    Rx / DC Orders ED Discharge Orders     None         Lacretia Leigh, MD 01/16/22 1436

## 2022-01-16 NOTE — ED Notes (Signed)
Charge Maggie notified that the patient has the flu and needs a breathing treatment

## 2022-01-16 NOTE — ED Notes (Signed)
Patient assisted to the bedpan at this time and verbalizes that she will hit the call bell when she is done. Patient provided call bell education and verbalizes understanding on how to use such. Patient has call bell in reach and verbalizes understanding of the need for the urine sample.

## 2022-01-16 NOTE — ED Notes (Signed)
CT called at this time and notified that the patient is ready. CT tech reports that they are full at this time and will send for the patient when possible

## 2022-01-16 NOTE — ED Notes (Signed)
CT notified patient is ready to go whenever possible

## 2022-01-16 NOTE — ED Notes (Signed)
Patient reminded of a urine sample at this time. Patient verbalizes understanding

## 2022-01-16 NOTE — ED Notes (Signed)
Cts  room 28  nebs

## 2022-01-17 MED ORDER — OSELTAMIVIR PHOSPHATE 30 MG PO CAPS: 30.0000 mg | ORAL_CAPSULE | Freq: Two times a day (BID) | ORAL | 0 refills | Status: AC

## 2022-01-17 MED ORDER — ALBUTEROL SULFATE (2.5 MG/3ML) 0.083% IN NEBU: 2.5000 mg | INHALATION_SOLUTION | Freq: Four times a day (QID) | RESPIRATORY_TRACT | 12 refills | Status: AC | PRN

## 2022-01-17 MED ORDER — PREDNISONE 10 MG PO TABS: 40.0000 mg | ORAL_TABLET | Freq: Every day | ORAL | 0 refills | Status: AC

## 2022-01-28 DIAGNOSIS — J441 Chronic obstructive pulmonary disease with (acute) exacerbation: Secondary | ICD-10-CM | POA: Diagnosis not present

## 2022-01-29 ENCOUNTER — Telehealth: Payer: Self-pay

## 2022-01-29 NOTE — Telephone Encounter (Signed)
        Patient  visited [facility] on 1/11    Telephone encounter attempt :  1st  Patient hung up    [place], [facility]  [phone] [address], [place], [place] [address] Phone: [phone] Email: [email or web address]

## 2022-02-06 DIAGNOSIS — J329 Chronic sinusitis, unspecified: Secondary | ICD-10-CM | POA: Diagnosis not present

## 2022-02-06 DIAGNOSIS — Z639 Problem related to primary support group, unspecified: Secondary | ICD-10-CM | POA: Diagnosis not present

## 2022-02-06 DIAGNOSIS — I700 Atherosclerosis of aorta: Secondary | ICD-10-CM | POA: Diagnosis not present

## 2022-02-06 DIAGNOSIS — Z0289 Encounter for other administrative examinations: Secondary | ICD-10-CM | POA: Diagnosis not present

## 2022-02-06 DIAGNOSIS — J449 Chronic obstructive pulmonary disease, unspecified: Secondary | ICD-10-CM | POA: Diagnosis not present

## 2022-02-06 DIAGNOSIS — R059 Cough, unspecified: Secondary | ICD-10-CM | POA: Diagnosis not present

## 2022-02-06 DIAGNOSIS — M069 Rheumatoid arthritis, unspecified: Secondary | ICD-10-CM | POA: Diagnosis not present

## 2022-02-26 DIAGNOSIS — Z79899 Other long term (current) drug therapy: Secondary | ICD-10-CM | POA: Diagnosis not present

## 2022-02-26 DIAGNOSIS — G8929 Other chronic pain: Secondary | ICD-10-CM | POA: Diagnosis not present

## 2022-02-26 DIAGNOSIS — M25561 Pain in right knee: Secondary | ICD-10-CM | POA: Diagnosis not present

## 2022-02-26 DIAGNOSIS — M25562 Pain in left knee: Secondary | ICD-10-CM | POA: Diagnosis not present

## 2022-02-26 DIAGNOSIS — M25519 Pain in unspecified shoulder: Secondary | ICD-10-CM | POA: Diagnosis not present

## 2022-04-30 DIAGNOSIS — M2550 Pain in unspecified joint: Secondary | ICD-10-CM | POA: Diagnosis not present

## 2022-04-30 DIAGNOSIS — Z0289 Encounter for other administrative examinations: Secondary | ICD-10-CM | POA: Diagnosis not present

## 2022-04-30 DIAGNOSIS — G629 Polyneuropathy, unspecified: Secondary | ICD-10-CM | POA: Diagnosis not present

## 2022-04-30 DIAGNOSIS — D692 Other nonthrombocytopenic purpura: Secondary | ICD-10-CM | POA: Diagnosis not present

## 2022-04-30 DIAGNOSIS — M069 Rheumatoid arthritis, unspecified: Secondary | ICD-10-CM | POA: Diagnosis not present

## 2022-04-30 DIAGNOSIS — Z1231 Encounter for screening mammogram for malignant neoplasm of breast: Secondary | ICD-10-CM | POA: Diagnosis not present

## 2022-06-13 DIAGNOSIS — Z885 Allergy status to narcotic agent status: Secondary | ICD-10-CM | POA: Diagnosis not present

## 2022-06-13 DIAGNOSIS — R0682 Tachypnea, not elsewhere classified: Secondary | ICD-10-CM | POA: Diagnosis not present

## 2022-06-13 DIAGNOSIS — J440 Chronic obstructive pulmonary disease with acute lower respiratory infection: Secondary | ICD-10-CM | POA: Diagnosis not present

## 2022-06-13 DIAGNOSIS — Z7952 Long term (current) use of systemic steroids: Secondary | ICD-10-CM | POA: Diagnosis not present

## 2022-06-13 DIAGNOSIS — Z743 Need for continuous supervision: Secondary | ICD-10-CM | POA: Diagnosis not present

## 2022-06-13 DIAGNOSIS — R0689 Other abnormalities of breathing: Secondary | ICD-10-CM | POA: Diagnosis not present

## 2022-06-13 DIAGNOSIS — R6889 Other general symptoms and signs: Secondary | ICD-10-CM | POA: Diagnosis not present

## 2022-06-13 DIAGNOSIS — Z1152 Encounter for screening for COVID-19: Secondary | ICD-10-CM | POA: Diagnosis not present

## 2022-06-13 DIAGNOSIS — M5090 Cervical disc disorder, unspecified, unspecified cervical region: Secondary | ICD-10-CM | POA: Diagnosis not present

## 2022-06-13 DIAGNOSIS — Z791 Long term (current) use of non-steroidal anti-inflammatories (NSAID): Secondary | ICD-10-CM | POA: Diagnosis not present

## 2022-06-13 DIAGNOSIS — Z888 Allergy status to other drugs, medicaments and biological substances status: Secondary | ICD-10-CM | POA: Diagnosis not present

## 2022-06-13 DIAGNOSIS — J441 Chronic obstructive pulmonary disease with (acute) exacerbation: Secondary | ICD-10-CM | POA: Diagnosis not present

## 2022-06-13 DIAGNOSIS — Z79891 Long term (current) use of opiate analgesic: Secondary | ICD-10-CM | POA: Diagnosis not present

## 2022-06-13 DIAGNOSIS — Z79899 Other long term (current) drug therapy: Secondary | ICD-10-CM | POA: Diagnosis not present

## 2022-06-13 DIAGNOSIS — J810 Acute pulmonary edema: Secondary | ICD-10-CM | POA: Diagnosis not present

## 2022-06-13 DIAGNOSIS — J9601 Acute respiratory failure with hypoxia: Secondary | ICD-10-CM | POA: Diagnosis not present

## 2022-06-13 DIAGNOSIS — R0602 Shortness of breath: Secondary | ICD-10-CM | POA: Diagnosis not present

## 2022-06-13 DIAGNOSIS — J449 Chronic obstructive pulmonary disease, unspecified: Secondary | ICD-10-CM | POA: Diagnosis not present

## 2022-06-13 DIAGNOSIS — R918 Other nonspecific abnormal finding of lung field: Secondary | ICD-10-CM | POA: Diagnosis not present

## 2022-06-13 DIAGNOSIS — G894 Chronic pain syndrome: Secondary | ICD-10-CM | POA: Diagnosis not present

## 2022-06-13 DIAGNOSIS — B9789 Other viral agents as the cause of diseases classified elsewhere: Secondary | ICD-10-CM | POA: Diagnosis not present

## 2022-06-13 DIAGNOSIS — Z886 Allergy status to analgesic agent status: Secondary | ICD-10-CM | POA: Diagnosis not present

## 2022-06-13 DIAGNOSIS — F17200 Nicotine dependence, unspecified, uncomplicated: Secondary | ICD-10-CM | POA: Diagnosis not present

## 2022-06-13 DIAGNOSIS — Z7409 Other reduced mobility: Secondary | ICD-10-CM | POA: Diagnosis not present

## 2022-06-13 DIAGNOSIS — R0902 Hypoxemia: Secondary | ICD-10-CM | POA: Diagnosis not present

## 2022-06-13 DIAGNOSIS — R059 Cough, unspecified: Secondary | ICD-10-CM | POA: Diagnosis not present

## 2022-06-13 DIAGNOSIS — R069 Unspecified abnormalities of breathing: Secondary | ICD-10-CM | POA: Diagnosis not present

## 2022-06-13 DIAGNOSIS — F17210 Nicotine dependence, cigarettes, uncomplicated: Secondary | ICD-10-CM | POA: Diagnosis not present

## 2022-06-17 DIAGNOSIS — F17200 Nicotine dependence, unspecified, uncomplicated: Secondary | ICD-10-CM | POA: Diagnosis not present

## 2022-06-18 DIAGNOSIS — F17200 Nicotine dependence, unspecified, uncomplicated: Secondary | ICD-10-CM | POA: Diagnosis not present

## 2022-06-19 DIAGNOSIS — F17200 Nicotine dependence, unspecified, uncomplicated: Secondary | ICD-10-CM | POA: Diagnosis not present

## 2022-06-24 DIAGNOSIS — E875 Hyperkalemia: Secondary | ICD-10-CM | POA: Diagnosis not present

## 2022-06-24 DIAGNOSIS — Z0289 Encounter for other administrative examinations: Secondary | ICD-10-CM | POA: Diagnosis not present

## 2022-06-24 DIAGNOSIS — M7581 Other shoulder lesions, right shoulder: Secondary | ICD-10-CM | POA: Diagnosis not present

## 2022-06-24 DIAGNOSIS — E559 Vitamin D deficiency, unspecified: Secondary | ICD-10-CM | POA: Diagnosis not present

## 2022-06-24 DIAGNOSIS — J449 Chronic obstructive pulmonary disease, unspecified: Secondary | ICD-10-CM | POA: Diagnosis not present

## 2022-06-24 DIAGNOSIS — Z09 Encounter for follow-up examination after completed treatment for conditions other than malignant neoplasm: Secondary | ICD-10-CM | POA: Diagnosis not present

## 2022-06-24 DIAGNOSIS — Z9989 Dependence on other enabling machines and devices: Secondary | ICD-10-CM | POA: Diagnosis not present

## 2022-07-25 DIAGNOSIS — D692 Other nonthrombocytopenic purpura: Secondary | ICD-10-CM | POA: Diagnosis not present

## 2022-07-25 DIAGNOSIS — M069 Rheumatoid arthritis, unspecified: Secondary | ICD-10-CM | POA: Diagnosis not present

## 2022-07-25 DIAGNOSIS — I700 Atherosclerosis of aorta: Secondary | ICD-10-CM | POA: Diagnosis not present

## 2022-07-25 DIAGNOSIS — Z13820 Encounter for screening for osteoporosis: Secondary | ICD-10-CM | POA: Diagnosis not present

## 2022-07-25 DIAGNOSIS — K644 Residual hemorrhoidal skin tags: Secondary | ICD-10-CM | POA: Diagnosis not present

## 2022-07-25 DIAGNOSIS — Z0289 Encounter for other administrative examinations: Secondary | ICD-10-CM | POA: Diagnosis not present

## 2022-07-25 DIAGNOSIS — J449 Chronic obstructive pulmonary disease, unspecified: Secondary | ICD-10-CM | POA: Diagnosis not present

## 2022-07-25 DIAGNOSIS — L0292 Furuncle, unspecified: Secondary | ICD-10-CM | POA: Diagnosis not present

## 2022-07-30 DIAGNOSIS — Z87891 Personal history of nicotine dependence: Secondary | ICD-10-CM | POA: Diagnosis not present

## 2022-08-07 DIAGNOSIS — J449 Chronic obstructive pulmonary disease, unspecified: Secondary | ICD-10-CM | POA: Diagnosis not present

## 2022-08-07 DIAGNOSIS — Z0289 Encounter for other administrative examinations: Secondary | ICD-10-CM | POA: Diagnosis not present

## 2022-08-13 DIAGNOSIS — M791 Myalgia, unspecified site: Secondary | ICD-10-CM | POA: Diagnosis not present

## 2022-08-13 DIAGNOSIS — M4692 Unspecified inflammatory spondylopathy, cervical region: Secondary | ICD-10-CM | POA: Diagnosis not present

## 2022-08-14 ENCOUNTER — Other Ambulatory Visit: Payer: Self-pay | Admitting: Orthopedic Surgery

## 2022-08-14 DIAGNOSIS — M542 Cervicalgia: Secondary | ICD-10-CM

## 2022-08-16 ENCOUNTER — Institutional Professional Consult (permissible substitution): Payer: 59 | Admitting: Pulmonary Disease

## 2022-08-19 DIAGNOSIS — R21 Rash and other nonspecific skin eruption: Secondary | ICD-10-CM | POA: Diagnosis not present

## 2022-08-19 DIAGNOSIS — H5789 Other specified disorders of eye and adnexa: Secondary | ICD-10-CM | POA: Diagnosis not present

## 2022-08-19 DIAGNOSIS — Z0289 Encounter for other administrative examinations: Secondary | ICD-10-CM | POA: Diagnosis not present

## 2022-08-27 DIAGNOSIS — L732 Hidradenitis suppurativa: Secondary | ICD-10-CM | POA: Diagnosis not present

## 2022-09-11 ENCOUNTER — Inpatient Hospital Stay: Admission: RE | Admit: 2022-09-11 | Payer: 59 | Source: Ambulatory Visit

## 2022-09-24 ENCOUNTER — Encounter: Payer: Self-pay | Admitting: Pulmonary Disease

## 2023-09-04 ENCOUNTER — Encounter: Payer: Self-pay | Admitting: Emergency Medicine

## 2023-09-04 ENCOUNTER — Ambulatory Visit
Admission: EM | Admit: 2023-09-04 | Discharge: 2023-09-04 | Disposition: A | Discharge status: Home / Self Care | Attending: Physician Assistant | Admitting: Physician Assistant

## 2023-09-04 DIAGNOSIS — M62838 Other muscle spasm: Secondary | ICD-10-CM

## 2023-09-04 MED ORDER — TIZANIDINE HCL 4 MG PO TABS: 2.0000 mg | ORAL_TABLET | Freq: Three times a day (TID) | ORAL | 0 refills | Status: AC | PRN

## 2023-09-04 NOTE — ED Provider Notes (Signed)
 [facility]    CSN: [id number] Arrival date & time: 09/04/23  1419      History   Chief Complaint Chief Complaint  Patient presents with   Neck Pain    HPI Dawn Lane is a 73 y.o. female.   Patient here today for evaluation of neck pain that started 2 days ago.  She reports that pain is to the left side and is limiting her range of motion.  She denies any injury that she is aware of.  She states that movement worsens pain.  She denies any numbness or tingling  The history is provided by the patient.  Neck Pain Associated symptoms: no fever and no numbness     Past Medical History:  Diagnosis Date   Anxiety 2011   Arthritis    Asthma    Emphysema    Hemorrhoids    Polysubstance abuse (HCC) 2011   4 day [facility] admission for this. Cocaine, ETOH, MJA,    Tobacco use     Patient Active Problem List   Diagnosis Date Noted   COPD active smoker/ pfts pending 01/04/2016   Colitis 08/04/2013   Abdominal pain 08/04/2013   Nausea, vomiting and diarrhea 08/04/2013   Cigarette smoker 08/04/2013   Dyspnea 08/02/2010    Past Surgical History:  Procedure Laterality Date   COLONOSCOPY N/A 08/07/2013   Procedure: COLONOSCOPY;  Surgeon: Lupita FORBES Commander, MD;  Location: [facility];  Service: Endoscopy;  Laterality: N/A;   INCISION AND DRAINAGE  12/2009   of thrombosed internal hemorrhoid   rc repair Left 10/14/2018   by Dr. Yvone   TUBAL LIGATION      OB History   No obstetric history on file.      Home Medications    Prior to Admission medications   Medication Sig Start Date End Date Taking? Authorizing Provider  tiZANidine  (ZANAFLEX ) 4 MG tablet Take 0.5-1 tablets (2-4 mg total) by mouth every 8 (eight) hours as needed for muscle spasms. 09/04/23  Yes Billy Asberry FALCON, PA-C  albuterol  (PROVENTIL  HFA;VENTOLIN  HFA) 108 (90 BASE) MCG/ACT inhaler Inhale 2 puffs into the lungs every 6 (six) hours as needed for wheezing. 09/14/12   Ward, Josette SAILOR, DO   albuterol  (PROVENTIL ) (2.5 MG/3ML) 0.083% nebulizer solution Take 3 mLs (2.5 mg total) by nebulization every 4 (four) hours as needed for wheezing or shortness of breath. 04/15/14   Joyice Sauer, DO  albuterol  (PROVENTIL ) (2.5 MG/3ML) 0.083% nebulizer solution Take 3 mLs (2.5 mg total) by nebulization every 6 (six) hours as needed for wheezing or shortness of breath. 01/17/22   Dreama Longs, MD  alum & mag hydroxide-simeth (MAALOX PLUS) 400-400-40 MG/5ML suspension Take 10 mLs by mouth every 6 (six) hours as needed for indigestion. 06/07/20   White, Shelba SAUNDERS, NP  amoxicillin  (AMOXIL ) 500 MG capsule Take 1 capsule (500 mg total) by mouth 3 (three) times daily. 03/03/21   Doretha Folks, MD  Ascorbic Acid (VITAMIN C PO) Take 1 tablet by mouth in the morning and at bedtime.     [provider]  BELBUCA  900 MCG FILM Take 1 strip by mouth 2 (two) times daily. 06/30/19   [provider]  cephALEXin  (KEFLEX ) 500 MG capsule Take 1 capsule (500 mg total) by mouth 2 (two) times daily. 09/18/20   Christopher Savannah, PA-C  diclofenac  Sodium (VOLTAREN ) 1 % GEL Apply 2 g topically 4 (four) times daily as needed (pain).     [provider]  etodolac  (LODINE  XL) 400 MG 24 hr tablet Take 400 mg by mouth daily as needed (pain).  06/24/19   [provider]  gabapentin  (NEURONTIN ) 800 MG tablet Take 800 mg by mouth 4 (four) times daily. 06/25/19   [provider]  methylPREDNISolone  (MEDROL  DOSEPAK) 4 MG TBPK tablet Take Medrol  Dosepack Taper as instructed. 03/09/21   Laurice Maude BROCKS, MD  Multiple Vitamins-Minerals (ZINC  PO) Take 1 tablet by mouth in the morning and at bedtime.    [provider]  ondansetron  (ZOFRAN ) 4 MG tablet Take 1 tablet (4 mg total) by mouth every 6 (six) hours. 06/07/20   White, Shelba SAUNDERS, NP  oxyCODONE  (ROXICODONE ) 15 MG immediate release tablet Take 15 mg by mouth 5 (five) times daily. 06/28/19   [provider]  polycarbophil (FIBERCON)  625 MG tablet Take 625 mg by mouth daily.    [provider]  polyethylene glycol powder (GLYCOLAX /MIRALAX ) 17 GM/SCOOP powder Take 17 g by mouth 2 (two) times daily. 08/28/20   Geroldine Berg, MD  pregabalin (LYRICA) 100 MG capsule Take 100 mg by mouth 2 (two) times daily. 05/22/20   [provider]  sulfamethoxazole-trimethoprim (BACTRIM DS) 800-160 MG tablet Take 1 tablet by mouth 2 (two) times daily. 03/21/20   [provider]  Vitamin D, Ergocalciferol, (DRISDOL) 50000 units CAPS capsule Take 50,000 Units by mouth every Sunday.  11/04/16   [provider]    Family History Family History  Problem Relation Age of Onset   Emphysema Father    Lung cancer Father        was a smoker   Heart disease Father    Prostate cancer Father    Asthma Sister    Uterine cancer Mother     Social History Social History   Tobacco Use   Smoking status: Every Day    Current packs/day: 0.50    Average packs/day: 0.5 packs/day for 50.0 years (25.0 ttl pk-yrs)    Types: Cigarettes    Passive exposure: Current   Smokeless tobacco: Never  Vaping Use   Vaping status: Never Used  Substance Use Topics   Alcohol use: Yes    Comment: BAC .20   Drug use: Yes    Frequency: 7.0 times per week    Types: Marijuana, Cocaine, Crack cocaine, Benzodiazepines    Comment: UDS + Cocaine- denies 06/12/18     Allergies   Lorazepam , Advil [ibuprofen], and Vicodin [hydrocodone-acetaminophen ]   Review of Systems Review of Systems  Constitutional:  Negative for chills and fever.  Eyes:  Negative for discharge and redness.  Respiratory:  Negative for shortness of breath.   Gastrointestinal:  Negative for abdominal pain, nausea and vomiting.  Musculoskeletal:  Positive for myalgias and neck pain.  Neurological:  Negative for numbness.     Physical Exam Triage Vital Signs ED Triage Vitals  Encounter Vitals Group     BP 09/04/23 1522 (!) 169/79     Girls Systolic BP  Percentile --      Girls Diastolic BP Percentile --      Boys Systolic BP Percentile --      Boys Diastolic BP Percentile --      Pulse Rate 09/04/23 1522 68     Resp 09/04/23 1522 16     Temp 09/04/23 1522 98.2 F (36.8 C)     Temp Source 09/04/23 1522 Oral     SpO2 09/04/23 1522 96 %     Weight 09/04/23 1521 121 lb 14.6  oz (55.3 kg)     Height --      Head Circumference --      Peak Flow --      Pain Score 09/04/23 1520 10     Pain Loc --      Pain Education --      Exclude from Growth Chart --    No data found.  Updated Vital Signs BP (!) 169/79 (BP Location: Right Arm)   Pulse 68   Temp 98.2 F (36.8 C) (Oral)   Resp 16   Wt 121 lb 14.6 oz (55.3 kg)   SpO2 96%   BMI 19.68 kg/m   Visual Acuity Right Eye Distance:   Left Eye Distance:   Bilateral Distance:    Right Eye Near:   Left Eye Near:    Bilateral Near:     Physical Exam Vitals and nursing note reviewed.  Constitutional:      General: She is not in acute distress.    Appearance: Normal appearance. She is not ill-appearing.  HENT:     Head: Normocephalic and atraumatic.  Eyes:     Conjunctiva/sclera: Conjunctivae normal.  Cardiovascular:     Rate and Rhythm: Normal rate.  Pulmonary:     Effort: Pulmonary effort is normal. No respiratory distress.  Musculoskeletal:     Comments: Decreased range of motion of neck due to pain.  Mild tenderness appreciated to left neck.  Musculature with spasms noted  Neurological:     Mental Status: She is alert.  Psychiatric:        Mood and Affect: Mood normal.        Behavior: Behavior normal.        Thought Content: Thought content normal.      UC Treatments / Results  Labs (all labs ordered are listed, but only abnormal results are displayed) Labs Reviewed - No data to display  EKG   Radiology No results found.  Procedures Procedures (including critical care time)  Medications Ordered in UC Medications - No data to display  Initial  Impression / Assessment and Plan / UC Course  I have reviewed the triage vital signs and the nursing notes.  Pertinent labs & imaging results that were available during my care of the patient were reviewed by me and considered in my medical decision making (see chart for details).    Will treat with tizanidine .  Advised may cause drowsiness and use with caution.  Encouraged heat application as well as massage.  Recommended follow-up if no gradual improvement with any further concerns.  Final Clinical Impressions(s) / UC Diagnoses   Final diagnoses:  Neck muscle spasm   Discharge Instructions   None    ED Prescriptions     Medication Sig Dispense Auth. Provider   tiZANidine  (ZANAFLEX ) 4 MG tablet Take 0.5-1 tablets (2-4 mg total) by mouth every 8 (eight) hours as needed for muscle spasms. 30 tablet Billy Asberry FALCON, PA-C      PDMP not reviewed this encounter.   Billy Asberry FALCON, PA-C 09/04/23 1630

## 2023-09-04 NOTE — ED Triage Notes (Signed)
 Pt presents c/o neck pain x 2 days. Pt reports the pain in her neck is only on the left side and is limiting her ranger of motion. Pt says it feels like a spasm.   CMA noticed a knot on back of pt's neck while walking to room.

## 2023-09-23 ENCOUNTER — Encounter: Payer: Self-pay | Admitting: Internal Medicine

## 2023-12-18 ENCOUNTER — Other Ambulatory Visit: Payer: Self-pay | Admitting: Family Medicine

## 2023-12-18 DIAGNOSIS — Z1231 Encounter for screening mammogram for malignant neoplasm of breast: Secondary | ICD-10-CM

## 2024-01-19 ENCOUNTER — Ambulatory Visit
Admission: RE | Admit: 2024-01-19 | Discharge: 2024-01-19 | Disposition: A | Source: Ambulatory Visit | Attending: Family Medicine | Admitting: Family Medicine

## 2024-01-19 DIAGNOSIS — Z1231 Encounter for screening mammogram for malignant neoplasm of breast: Secondary | ICD-10-CM

## 2024-02-07 NOTE — Progress Notes (Unsigned)
" ° °  Cardiology [facility]:    Date:  02/07/2024   ID:  Dawn Lane, DOB 23-Jan-1950, MRN [id number]  PCP:  [facility]  Cardiologist:  None { Click to update primary MD,subspecialty MD or APP then REFRESH:1}    Referring MD: Maree Leni Edyth DELENA, MD   Chief Complaint: abnormal EKG  History of Present Illness:    Dawn Lane is a 74 y.o. female with a history of COPD, rheumatoid arthritis, cervical spondylolysis and spinal stenosis, vitamin D deficiency, hidradenitis suppurativa, bipolar disorder, schizophrenia, and tobacco abuse who presents today as a new patient in the [facility] for further evaluation of an abnormal EKG.   Patient was seen by PCP on 12/18/2023 and referred to Cardiology for further evaluation of an abnormal EKG. EKG at that visit showed normal sinus rhythm, rate 79 bpm, with LAFB and isolated T wave inversions in lead aVL.  ***  Abnormal EKG Recent EKG at PCP's office in 12/2023 showed normal sinus rhythm with LAFB and isolated T wave inversions in lead aVL.  - ***  EKGs/Labs/Other Studies Reviewed:    The following studies were reviewed:  N/A  EKG:  EKG ordered today. EKG personally reviewed and demonstrates ***.  Recent Labs: No results found for requested labs within last 365 days.  Recent Lipid Panel No results found for: CHOL, TRIG, HDL, CHOLHDL, VLDL, LDLCALC, LDLDIRECT  Physical Exam:    Vital Signs: There were no vitals taken for this visit.    Wt Readings from Last 3 Encounters:  09/04/23 121 lb 14.6 oz (55.3 kg)  01/16/22 122 lb (55.3 kg)  09/18/20 123 lb (55.8 kg)     General: 74 y.o. female in no acute distress. HEENT: Normocephalic and atraumatic. Sclera clear.  Neck: Supple. No carotid bruits. No JVD. Heart: *** RRR. Distinct S1 and S2. No murmurs, gallops, or rubs.  Lungs: No increased work of breathing. Clear to ausculation bilaterally. No wheezes, rhonchi, or rales.  Abdomen: Soft,  non-distended, and non-tender to palpation.  Extremities: No lower extremity edema.  Radial and distal pedal pulses 2+ and equal bilaterally. Skin: Warm and dry. Neuro: No focal deficits. Psych: Normal affect. Responds appropriately.   Assessment:    No diagnosis found.  Plan:     Disposition: Follow up in ***   Signed, Oneal Schoenberger E Zylon Creamer, PA-C  02/07/2024 11:25 AM    [facility] "

## 2024-02-12 ENCOUNTER — Ambulatory Visit: Admitting: Student

## 2024-02-12 ENCOUNTER — Encounter: Payer: Self-pay | Admitting: Gastroenterology
